# Patient Record
Sex: Male | Born: 1950 | Race: White | Hispanic: No | Marital: Single | Smoking: Never smoker
Health system: Southern US, Community
[De-identification: ages and names within clinical notes are randomized; demographics above are authoritative.]

## PROBLEM LIST (undated history)

## (undated) DIAGNOSIS — I10 Essential (primary) hypertension: Secondary | ICD-10-CM

## (undated) DIAGNOSIS — H409 Unspecified glaucoma: Secondary | ICD-10-CM

## (undated) DIAGNOSIS — D649 Anemia, unspecified: Secondary | ICD-10-CM

## (undated) HISTORY — DX: Anemia, unspecified: D64.9

## (undated) HISTORY — DX: Unspecified glaucoma: H40.9

## (undated) HISTORY — PX: HERNIA REPAIR: SHX51

---

## 2013-01-10 ENCOUNTER — Ambulatory Visit (INDEPENDENT_AMBULATORY_CARE_PROVIDER_SITE_OTHER): Payer: No Typology Code available for payment source | Admitting: Family Medicine

## 2013-01-10 VITALS — BP 140/90 | HR 105 | Temp 98.0°F | Resp 18 | Ht 66.0 in | Wt 190.0 lb

## 2013-01-10 DIAGNOSIS — K5289 Other specified noninfective gastroenteritis and colitis: Secondary | ICD-10-CM

## 2013-01-10 DIAGNOSIS — R112 Nausea with vomiting, unspecified: Secondary | ICD-10-CM

## 2013-01-10 DIAGNOSIS — L719 Rosacea, unspecified: Secondary | ICD-10-CM

## 2013-01-10 DIAGNOSIS — R197 Diarrhea, unspecified: Secondary | ICD-10-CM

## 2013-01-10 DIAGNOSIS — L711 Rhinophyma: Secondary | ICD-10-CM

## 2013-01-10 DIAGNOSIS — E869 Volume depletion, unspecified: Secondary | ICD-10-CM

## 2013-01-10 MED ORDER — ONDANSETRON 4 MG PO TBDP: 4.0000 mg | ORAL_TABLET | Freq: Three times a day (TID) | ORAL | Status: DC | PRN

## 2013-01-10 MED ORDER — METRONIDAZOLE 0.75 % EX GEL: Freq: Two times a day (BID) | CUTANEOUS | Status: DC

## 2013-01-10 NOTE — Progress Notes (Signed)
Subjective:    Patient ID: Roberto [name], male    DOB: 1950/10/07, 62 y.o.   MRN: [id number]  HPI Roberto [name] is a 62 y.o. male  No recent medical care/no primary care.   Yesterday  evening - went to Restaurant - shrimp, oysters, scallops, deep fried. Started with diarrhea and vomiting few hours later. Stomach growling.  Diarrhea - 4 times today. Vomited 4 times today. Called out of work today as some dizziness today.  UOP 4-5 times today, but dark yellow. rtried drinking fluids, but comes back up.  Upset stomach, not painful except occasional cramps.  No fever, feels some chills. Has been drinking fluids. No known sick contacts.   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 Nonsmoker.  Etoh: occasional/rare recently. DUI 14 years ago.  Breathalyzer in car until March.   Rash on face - 4 times in life. Occasional boils on another areas of the body that come and go.  diagnosed with Rosacea by ophthalmologist when also diagnosed with early glaucoma.  Review of Systems  Constitutional: Negative for fever.  Gastrointestinal: Positive for nausea, vomiting and diarrhea. Negative for anal bleeding.       No hematemesis   Genitourinary: Negative for hematuria and difficulty urinating.  Musculoskeletal: Negative for back pain.    As above.      Objective:   Physical Exam  Vitals reviewed. Constitutional: He is oriented to person, place, and time. He appears well-developed and well-nourished.  HENT:  Head: Normocephalic and atraumatic.  Nose:    Mouth/Throat: Oropharynx is clear and moist. No oropharyngeal exudate.  Eyes: EOM are normal. Pupils are equal, round, and reactive to light.  Neck: No JVD present. Carotid bruit is not present.  Cardiovascular: Normal rate, regular rhythm and normal heart sounds.   Pulmonary/Chest: Effort normal and breath sounds normal. No respiratory distress.  Abdominal: Soft. There is tenderness (minimal epigastirc.) in the epigastric area.  There is no rigidity, no rebound, no guarding, no CVA tenderness, no tenderness at McBurney's point and negative Murphy's sign.  Musculoskeletal: He exhibits no edema.  Neurological: He is alert and oriented to person, place, and time.  Skin: Skin is warm, dry and intact.  Nl turgor.   Psychiatric: He has a normal mood and affect.    Orthostatics noted. Discussed IV hydration.  He has not felt dizzy since earlier today, and would like to try ORT o/n.       Assessment & Plan:  [name] is a 62 y.o. male  N/V/D, with likely  food borne illness. vs viral illness, volume depletion with dizziness last night and earlier today. IVF declined at present. ORT stressed and technique, avoid solid food tonight, Zofran rx given for n/v.  h/o below.  ER precautions discussed for o/n and RTC in am if needed. OOW until Friday, but if not improving by then, rtc for recheck and possible stool cx. Sooner if any worsening. Understanding expressed.   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  Trigger avoidance discussed, but will refer to derm as may need more than just topical treatment. Few irritated papulopustular areas, will start metronidazole gel topically. Sed, avoid alcohol.   Meds ordered this encounter  Medications  . ondansetron (ZOFRAN ODT) 4 MG disintegrating tablet    Sig: Take 1 tablet (4 mg total) by mouth every 8 (eight) hours as needed for nausea.    Dispense:  10 tablet    Refill:  0  . metroNIDAZOLE (METROGEL) 0.75 %  gel    Sig: Apply topically 2 (two) times daily. To affected areas of face. Avoid eyes/mouth.    Dispense:  45 g    Refill:  1   Patient Instructions  Recheck in 2 days if not much improved. Return to the clinic or go to the nearest emergency room if any of your symptoms worsen or new symptoms occur. We will refer you to dermatologist.  Can start antibiotic gel now.  Do NOT work on elevated surfaces if any dizziness returns.    Gastroenteritis:  Diarrhea Infections caused by germs (bacterial) or a virus commonly cause diarrhea. Your caregiver has determined that with time, rest and fluids, the diarrhea should improve. In general, eat normally while drinking more water than usual. Although water may prevent dehydration, it does not contain salt and minerals (electrolytes). Broths, weak tea without caffeine and oral rehydration solutions (ORS) replace fluids and electrolytes. Small amounts of fluids should be taken frequently. Large amounts at one time may not be tolerated. Plain water may be harmful in infants and the elderly. Oral rehydrating solutions (ORS) are available at pharmacies and grocery stores. ORS replace water and important electrolytes in proper proportions. Sports drinks are not as effective as ORS and may be harmful due to sugars worsening diarrhea.  ORS is especially recommended for use in children with diarrhea. As a general guideline for children, replace any new fluid losses from diarrhea and/or vomiting with ORS as follows:   If your child weighs 22 pounds or under (10 kg or less), give 60-120 mL ( -  cup or 2 - 4 ounces) of ORS for each episode of diarrheal stool or vomiting episode.   If your child weighs more than 22 pounds (more than 10 kgs), give 120-240 mL ( - 1 cup or 4 - 8 ounces) of ORS for each diarrheal stool or episode of vomiting.   While correcting for dehydration, children should eat normally. However, foods high in sugar should be avoided because this may worsen diarrhea. Large amounts of carbonated soft drinks, juice, gelatin desserts and other highly sugared drinks should be avoided.   After correction of dehydration, other liquids that are appealing to the child may be added. Children should drink small amounts of fluids frequently and fluids should be increased as tolerated. Children should drink enough fluids to keep urine clear or pale yellow.   Adults should eat normally  while drinking more fluids than usual. Drink small amounts of fluids frequently and increase as tolerated. Drink enough fluids to keep urine clear or pale yellow. Broths, weak decaffeinated tea, lemon lime soft drinks (allowed to go flat) and ORS replace fluids and electrolytes.   Avoid:   Carbonated drinks.   Juice.   Extremely hot or cold fluids.   Caffeine drinks.   Fatty, greasy foods.   Alcohol.   Tobacco.   Too much intake of anything at one time.   Gelatin desserts.   Probiotics are active cultures of beneficial bacteria. They may lessen the amount and number of diarrheal stools in adults. Probiotics can be found in yogurt with active cultures and in supplements.   Wash hands well to avoid spreading bacteria and virus.   Anti-diarrheal medications are not recommended for infants and children.   Only take over-the-counter or prescription medicines for pain, discomfort or fever as directed by your caregiver. Do not give aspirin to children because it may cause Reye's Syndrome.   For adults, ask your caregiver if you should continue  all prescribed and over-the-counter medicines.   If your caregiver has given you a follow-up appointment, it is very important to keep that appointment. Not keeping the appointment could result in a chronic or permanent injury, and disability. If there is any problem keeping the appointment, you must call back to this facility for assistance.  SEEK IMMEDIATE MEDICAL CARE IF:   You or your child is unable to keep fluids down or other symptoms or problems become worse in spite of treatment.   Vomiting or diarrhea develops and becomes persistent.   There is vomiting of blood or bile (green material).   There is blood in the stool or the stools are black and tarry.   There is no urine output in 6-8 hours or there is only a small amount of very dark urine.   Abdominal pain develops, increases or localizes.   You have a fever.   Your baby is  older than 3 months with a rectal temperature of 102 F (38.9 C) or higher.   Your baby is 40 months old or younger with a rectal temperature of 100.4 F (38 C) or higher.   You or your child develops excessive weakness, dizziness, fainting or extreme thirst.   You or your child develops a rash, stiff neck, severe headache or become irritable or sleepy and difficult to awaken.  MAKE SURE YOU:   Understand these instructions.   Will watch your condition.   Will get help right away if you are not doing well or get worse.  Document Released: 04/23/2002 Document Revised: 04/22/2011 Document Reviewed: 03/10/2009 [facility] Patient Information 2012 [place], [place].  Nausea and Vomiting Nausea is a sick feeling that often comes before throwing up (vomiting). Vomiting is a reflex where stomach contents come out of your mouth. Vomiting can cause severe loss of body fluids (dehydration). Children and elderly adults can become dehydrated quickly, especially if they also have diarrhea. Nausea and vomiting are symptoms of a condition or disease. It is important to find the cause of your symptoms. CAUSES   Direct irritation of the stomach lining. This irritation can result from increased acid production (gastroesophageal reflux disease), infection, food poisoning, taking certain medicines (such as nonsteroidal anti-inflammatory drugs), alcohol use, or tobacco use.   Signals from the brain.These signals could be caused by a headache, heat exposure, an inner ear disturbance, increased pressure in the brain from injury, infection, a tumor, or a concussion, pain, emotional stimulus, or metabolic problems.   An obstruction in the gastrointestinal tract (bowel obstruction).   Illnesses such as diabetes, hepatitis, gallbladder problems, appendicitis, kidney problems, cancer, sepsis, atypical symptoms of a heart attack, or eating disorders.   Medical treatments such as chemotherapy and radiation.    Receiving medicine that makes you sleep (general anesthetic) during surgery.  DIAGNOSIS Your caregiver may ask for tests to be done if the problems do not improve after a few days. Tests may also be done if symptoms are severe or if the reason for the nausea and vomiting is not clear. Tests may include:  Urine tests.   Blood tests.   Stool tests.   Cultures (to look for evidence of infection).   X-rays or other imaging studies.  Test results can help your caregiver make decisions about treatment or the need for additional tests. TREATMENT You need to stay well hydrated. Drink frequently but in small amounts.You may wish to drink water, sports drinks, clear broth, or eat frozen ice pops or gelatin dessert to help  stay hydrated.When you eat, eating slowly may help prevent nausea.There are also some antinausea medicines that may help prevent nausea. HOME CARE INSTRUCTIONS   Take all medicine as directed by your caregiver.   If you do not have an appetite, do not force yourself to eat. However, you must continue to drink fluids.   If you have an appetite, eat a normal diet unless your caregiver tells you differently.   Eat a variety of complex carbohydrates (rice, wheat, potatoes, bread), lean meats, yogurt, fruits, and vegetables.   Avoid high-fat foods because they are more difficult to digest.   Drink enough water and fluids to keep your urine clear or pale yellow.   If you are dehydrated, ask your caregiver for specific rehydration instructions. Signs of dehydration may include:   Severe thirst.   Dry lips and mouth.   Dizziness.   Dark urine.   Decreasing urine frequency and amount.   Confusion.   Rapid breathing or pulse.  SEEK IMMEDIATE MEDICAL CARE IF:   You have blood or brown flecks (like coffee grounds) in your vomit.   You have black or bloody stools.   You have a severe headache or stiff neck.   You are confused.   You have severe abdominal pain.    You have chest pain or trouble breathing.   You do not urinate at least once every 8 hours.   You develop cold or clammy skin.   You continue to vomit for longer than 24 to 48 hours.   You have a fever.  MAKE SURE YOU:   Understand these instructions.   Will watch your condition.   Will get help right away if you are not doing well or get worse.  Document Released: 05/03/2005 Document Revised: 04/22/2011 Document Reviewed: 09/30/2010 [facility] Patient Information 2012 [place], [place].  Return to the clinic or go to the nearest emergency room if any of your symptoms worsen or new symptoms occur.   Rosacea Rosacea is a long-term (chronic) condition that affects the skin of the face (cheeks, nose, brow, and chin) and sometimes the eyes. Rosacea causes the blood vessels near the surface of the skin to enlarge, resulting in redness. This condition usually begins after age 93. It occurs most often in light-skinned women. Without treatment, rosacea tends to get worse over time. There is no cure for rosacea, but treatment can help control your symptoms. CAUSES  The cause is unknown. It is thought that some people may inherit a tendency to develop rosacea. Certain triggers can make your rosacea worse, including:  Hot baths.  Exercise.  Sunlight.  Very hot or cold temperatures.  Hot or spicy foods and drinks.  Drinking alcohol.  Stress.  Taking blood pressure medicine.  Long-term use of topical steroids on the face. SYMPTOMS   Redness of the face.  Red bumps or pimples on the face.  Red, enlarged nose (rhinophyma).  Blushing easily.  Red lines on the skin.  Irritated or burning feeling in the eyes.  Swollen eyelids. DIAGNOSIS  Your caregiver can usually tell what is wrong by asking about your symptoms and performing a physical exam. TREATMENT  Avoiding triggers is an important part of treatment. You will also need to see a skin specialist (dermatologist) who can  develop a treatment plan for you. The goals of treatment are to control your condition and to improve the appearance of your skin. It may take several weeks or months of treatment before you notice an improvement in  your skin. Even after your skin improves, you will likely need to continue treatment to prevent your rosacea from coming back. Treatment methods may include:  Using sunscreen or sunblock daily to protect the skin.  Antibiotic medicine, such as metronidazole, applied directly to the skin.  Antibiotics taken by mouth. This is usually prescribed if you have eye problems from your rosacea.  Laser surgery to improve the appearance of the skin. This surgery can reduce the appearance of red lines on the skin and can remove excess tissue from the nose to reduce its size. HOME CARE INSTRUCTIONS  Avoid things that seem to trigger your flare-ups.  If you are given antibiotics, take them as directed. Finish them even if you start to feel better.  Use a gentle facial cleanser that does not contain alcohol.  You may use a mild facial moisturizer.  Use a sunscreen or sunblock with SPF 30 or greater.  Wear a green-tinted foundation powder to conceal redness, if needed. Choose cosmetics that are noncomedogenic. This means they do not block your pores.  If your eyelids are affected, apply warm compresses to the eyelids. Do this up to 4 times a day or as directed by your caregiver. SEEK MEDICAL CARE IF:  Your skin problems get worse.  You feel depressed.  You lose your appetite.  You have trouble concentrating.  You have problems with your eyes, such as redness or itching. MAKE SURE YOU:  Understand these instructions.  Will watch your condition.  Will get help right away if you are not doing well or get worse. Document Released: 06/10/2004 Document Revised: 11/02/2011 Document Reviewed: 04/13/2011 [facility] Patient Information 2014 [place], [place].

## 2013-01-10 NOTE — Patient Instructions (Addendum)
Recheck in 2 days if not much improved. Return to the clinic or go to the nearest emergency room if any of your symptoms worsen or new symptoms occur. We will refer you to dermatologist.  Can start antibiotic gel now.  Do NOT work on elevated surfaces if any dizziness returns.   Gastroenteritis:  Diarrhea Infections caused by germs (bacterial) or a virus commonly cause diarrhea. Your caregiver has determined that with time, rest and fluids, the diarrhea should improve. In general, eat normally while drinking more water than usual. Although water may prevent dehydration, it does not contain salt and minerals (electrolytes). Broths, weak tea without caffeine and oral rehydration solutions (ORS) replace fluids and electrolytes. Small amounts of fluids should be taken frequently. Large amounts at one time may not be tolerated. Plain water may be harmful in infants and the elderly. Oral rehydrating solutions (ORS) are available at pharmacies and grocery stores. ORS replace water and important electrolytes in proper proportions. Sports drinks are not as effective as ORS and may be harmful due to sugars worsening diarrhea.  ORS is especially recommended for use in children with diarrhea. As a general guideline for children, replace any new fluid losses from diarrhea and/or vomiting with ORS as follows:   If your child weighs 22 pounds or under (10 kg or less), give 60-120 mL ( -  cup or 2 - 4 ounces) of ORS for each episode of diarrheal stool or vomiting episode.   If your child weighs more than 22 pounds (more than 10 kgs), give 120-240 mL ( - 1 cup or 4 - 8 ounces) of ORS for each diarrheal stool or episode of vomiting.   While correcting for dehydration, children should eat normally. However, foods high in sugar should be avoided because this may worsen diarrhea. Large amounts of carbonated soft drinks, juice, gelatin desserts and other highly sugared drinks should be avoided.   After correction of  dehydration, other liquids that are appealing to the child may be added. Children should drink small amounts of fluids frequently and fluids should be increased as tolerated. Children should drink enough fluids to keep urine clear or pale yellow.   Adults should eat normally while drinking more fluids than usual. Drink small amounts of fluids frequently and increase as tolerated. Drink enough fluids to keep urine clear or pale yellow. Broths, weak decaffeinated tea, lemon lime soft drinks (allowed to go flat) and ORS replace fluids and electrolytes.   Avoid:   Carbonated drinks.   Juice.   Extremely hot or cold fluids.   Caffeine drinks.   Fatty, greasy foods.   Alcohol.   Tobacco.   Too much intake of anything at one time.   Gelatin desserts.   Probiotics are active cultures of beneficial bacteria. They may lessen the amount and number of diarrheal stools in adults. Probiotics can be found in yogurt with active cultures and in supplements.   Wash hands well to avoid spreading bacteria and virus.   Anti-diarrheal medications are not recommended for infants and children.   Only take over-the-counter or prescription medicines for pain, discomfort or fever as directed by your caregiver. Do not give aspirin to children because it may cause Reye's Syndrome.   For adults, ask your caregiver if you should continue all prescribed and over-the-counter medicines.   If your caregiver has given you a follow-up appointment, it is very important to keep that appointment. Not keeping the appointment could result in a chronic or permanent injury, and  disability. If there is any problem keeping the appointment, you must call back to this facility for assistance.  SEEK IMMEDIATE MEDICAL CARE IF:   You or your child is unable to keep fluids down or other symptoms or problems become worse in spite of treatment.   Vomiting or diarrhea develops and becomes persistent.   There is vomiting of blood  or bile (green material).   There is blood in the stool or the stools are black and tarry.   There is no urine output in 6-8 hours or there is only a small amount of very dark urine.   Abdominal pain develops, increases or localizes.   You have a fever.   Your baby is older than 3 months with a rectal temperature of 102 F (38.9 C) or higher.   Your baby is 56 months old or younger with a rectal temperature of 100.4 F (38 C) or higher.   You or your child develops excessive weakness, dizziness, fainting or extreme thirst.   You or your child develops a rash, stiff neck, severe headache or become irritable or sleepy and difficult to awaken.  MAKE SURE YOU:   Understand these instructions.   Will watch your condition.   Will get help right away if you are not doing well or get worse.  Document Released: 04/23/2002 Document Revised: 04/22/2011 Document Reviewed: 03/10/2009 [facility] Patient Information 2012 [place], [place].  Nausea and Vomiting Nausea is a sick feeling that often comes before throwing up (vomiting). Vomiting is a reflex where stomach contents come out of your mouth. Vomiting can cause severe loss of body fluids (dehydration). Children and elderly adults can become dehydrated quickly, especially if they also have diarrhea. Nausea and vomiting are symptoms of a condition or disease. It is important to find the cause of your symptoms. CAUSES   Direct irritation of the stomach lining. This irritation can result from increased acid production (gastroesophageal reflux disease), infection, food poisoning, taking certain medicines (such as nonsteroidal anti-inflammatory drugs), alcohol use, or tobacco use.   Signals from the brain.These signals could be caused by a headache, heat exposure, an inner ear disturbance, increased pressure in the brain from injury, infection, a tumor, or a concussion, pain, emotional stimulus, or metabolic problems.   An obstruction in the  gastrointestinal tract (bowel obstruction).   Illnesses such as diabetes, hepatitis, gallbladder problems, appendicitis, kidney problems, cancer, sepsis, atypical symptoms of a heart attack, or eating disorders.   Medical treatments such as chemotherapy and radiation.   Receiving medicine that makes you sleep (general anesthetic) during surgery.  DIAGNOSIS Your caregiver may ask for tests to be done if the problems do not improve after a few days. Tests may also be done if symptoms are severe or if the reason for the nausea and vomiting is not clear. Tests may include:  Urine tests.   Blood tests.   Stool tests.   Cultures (to look for evidence of infection).   X-rays or other imaging studies.  Test results can help your caregiver make decisions about treatment or the need for additional tests. TREATMENT You need to stay well hydrated. Drink frequently but in small amounts.You may wish to drink water, sports drinks, clear broth, or eat frozen ice pops or gelatin dessert to help stay hydrated.When you eat, eating slowly may help prevent nausea.There are also some antinausea medicines that may help prevent nausea. HOME CARE INSTRUCTIONS   Take all medicine as directed by your caregiver.   If you  do not have an appetite, do not force yourself to eat. However, you must continue to drink fluids.   If you have an appetite, eat a normal diet unless your caregiver tells you differently.   Eat a variety of complex carbohydrates (rice, wheat, potatoes, bread), lean meats, yogurt, fruits, and vegetables.   Avoid high-fat foods because they are more difficult to digest.   Drink enough water and fluids to keep your urine clear or pale yellow.   If you are dehydrated, ask your caregiver for specific rehydration instructions. Signs of dehydration may include:   Severe thirst.   Dry lips and mouth.   Dizziness.   Dark urine.   Decreasing urine frequency and amount.   Confusion.    Rapid breathing or pulse.  SEEK IMMEDIATE MEDICAL CARE IF:   You have blood or brown flecks (like coffee grounds) in your vomit.   You have black or bloody stools.   You have a severe headache or stiff neck.   You are confused.   You have severe abdominal pain.   You have chest pain or trouble breathing.   You do not urinate at least once every 8 hours.   You develop cold or clammy skin.   You continue to vomit for longer than 24 to 48 hours.   You have a fever.  MAKE SURE YOU:   Understand these instructions.   Will watch your condition.   Will get help right away if you are not doing well or get worse.  Document Released: 05/03/2005 Document Revised: 04/22/2011 Document Reviewed: 09/30/2010 [facility] Patient Information 2012 [place], [place].  Return to the clinic or go to the nearest emergency room if any of your symptoms worsen or new symptoms occur.   Rosacea Rosacea is a long-term (chronic) condition that affects the skin of the face (cheeks, nose, brow, and chin) and sometimes the eyes. Rosacea causes the blood vessels near the surface of the skin to enlarge, resulting in redness. This condition usually begins after age 79. It occurs most often in light-skinned women. Without treatment, rosacea tends to get worse over time. There is no cure for rosacea, but treatment can help control your symptoms. CAUSES  The cause is unknown. It is thought that some people may inherit a tendency to develop rosacea. Certain triggers can make your rosacea worse, including:  Hot baths.  Exercise.  Sunlight.  Very hot or cold temperatures.  Hot or spicy foods and drinks.  Drinking alcohol.  Stress.  Taking blood pressure medicine.  Long-term use of topical steroids on the face. SYMPTOMS   Redness of the face.  Red bumps or pimples on the face.  Red, enlarged nose (rhinophyma).  Blushing easily.  Red lines on the skin.  Irritated or burning feeling in the  eyes.  Swollen eyelids. DIAGNOSIS  Your caregiver can usually tell what is wrong by asking about your symptoms and performing a physical exam. TREATMENT  Avoiding triggers is an important part of treatment. You will also need to see a skin specialist (dermatologist) who can develop a treatment plan for you. The goals of treatment are to control your condition and to improve the appearance of your skin. It may take several weeks or months of treatment before you notice an improvement in your skin. Even after your skin improves, you will likely need to continue treatment to prevent your rosacea from coming back. Treatment methods may include:  Using sunscreen or sunblock daily to protect the skin.  Antibiotic  medicine, such as metronidazole, applied directly to the skin.  Antibiotics taken by mouth. This is usually prescribed if you have eye problems from your rosacea.  Laser surgery to improve the appearance of the skin. This surgery can reduce the appearance of red lines on the skin and can remove excess tissue from the nose to reduce its size. HOME CARE INSTRUCTIONS  Avoid things that seem to trigger your flare-ups.  If you are given antibiotics, take them as directed. Finish them even if you start to feel better.  Use a gentle facial cleanser that does not contain alcohol.  You may use a mild facial moisturizer.  Use a sunscreen or sunblock with SPF 30 or greater.  Wear a green-tinted foundation powder to conceal redness, if needed. Choose cosmetics that are noncomedogenic. This means they do not block your pores.  If your eyelids are affected, apply warm compresses to the eyelids. Do this up to 4 times a day or as directed by your caregiver. SEEK MEDICAL CARE IF:  Your skin problems get worse.  You feel depressed.  You lose your appetite.  You have trouble concentrating.  You have problems with your eyes, such as redness or itching. MAKE SURE YOU:  Understand these  instructions.  Will watch your condition.  Will get help right away if you are not doing well or get worse. Document Released: 06/10/2004 Document Revised: 11/02/2011 Document Reviewed: 04/13/2011 [facility] Patient Information 2014 [place], [place].

## 2013-12-31 ENCOUNTER — Ambulatory Visit (INDEPENDENT_AMBULATORY_CARE_PROVIDER_SITE_OTHER): Payer: No Typology Code available for payment source | Admitting: Family Medicine

## 2013-12-31 VITALS — BP 150/78 | HR 92 | Temp 98.4°F | Resp 16 | Ht 64.5 in | Wt 184.2 lb

## 2013-12-31 DIAGNOSIS — R197 Diarrhea, unspecified: Secondary | ICD-10-CM

## 2013-12-31 DIAGNOSIS — L719 Rosacea, unspecified: Secondary | ICD-10-CM

## 2013-12-31 DIAGNOSIS — R112 Nausea with vomiting, unspecified: Secondary | ICD-10-CM

## 2013-12-31 DIAGNOSIS — K5289 Other specified noninfective gastroenteritis and colitis: Secondary | ICD-10-CM

## 2013-12-31 LAB — POCT CBC
GRANULOCYTE PERCENT: 68.5 % (ref 37–80)
HEMATOCRIT: 51 % (ref 43.5–53.7)
HEMOGLOBIN: 16.5 g/dL (ref 14.1–18.1)
Lymph, poc: 2.1 (ref 0.6–3.4)
MCH, POC: 30.3 pg (ref 27–31.2)
MCHC: 32.4 g/dL (ref 31.8–35.4)
MCV: 93.3 fL (ref 80–97)
MID (cbc): 0.2 (ref 0–0.9)
MPV: 7.1 fL (ref 0–99.8)
POC GRANULOCYTE: 4.8 (ref 2–6.9)
POC LYMPH %: 29.3 % (ref 10–50)
POC MID %: 2.2 %M (ref 0–12)
Platelet Count, POC: 215 10*3/uL (ref 142–424)
RBC: 5.46 M/uL (ref 4.69–6.13)
RDW, POC: 14.3 %
WBC: 7 10*3/uL (ref 4.6–10.2)

## 2013-12-31 MED ORDER — ONDANSETRON 4 MG PO TBDP: 4.0000 mg | ORAL_TABLET | Freq: Three times a day (TID) | ORAL | Status: DC | PRN

## 2013-12-31 NOTE — Patient Instructions (Signed)
Rest and drink plenty of fluids- gatorade is especially helpful.  You should start with bland foods such as broth, saltines, dry toast, bananas when you are able to start eating again.   If you are not feeling better in the next couple of days please let me know.  If you start getting worse either come and see us or go to the ER for follow-up.  .  I will be in touch with the rest of your labs.

## 2013-12-31 NOTE — Progress Notes (Addendum)
[facility] [address], [place] [address] [phone]  Date:  12/31/2013   Name:  Roberto [name]   DOB:  1951/04/22   MRN:  [id number]  PCP:  No PCP Per Patient    Chief Complaint: Emesis and Diarrhea   History of Present Illness:  Roberto [name] is a 63 y.o. very pleasant male patient who presents with the following:  Early this am (around 1am) he got up and had some cereal because he was hungry.  A few hours later he started vomiting and having diarrhea- he checked the milk and it seemed to be spoiled.  He figured that the milk must have made him sick.   He notes vomiting 4x, and diarrhea 4x.   He has not noted a fever, he has noted intermittent abdominal pain.   He feels overall better than he did earlier today.   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    There are no active problems to display for this patient.   Past Medical History  Diagnosis Date  . Anemia   . Glaucoma     Past Surgical History  Procedure Laterality Date  . Hernia repair      History  Substance Use Topics  . Smoking status: Never Smoker   . Smokeless tobacco: Not on file  . Alcohol Use: No    Family History  Problem Relation Age of Onset  . Heart disease Mother   . Stroke Father     No Known Allergies  Medication list has been reviewed and updated.  Current Outpatient Prescriptions on File Prior to Visit  Medication Sig Dispense Refill  . metroNIDAZOLE (METROGEL) 0.75 % gel Apply topically 2 (two) times daily. To affected areas of face. Avoid eyes/mouth.  45 g  1  . ondansetron (ZOFRAN ODT) 4 MG disintegrating tablet Take 1 tablet (4 mg total) by mouth every 8 (eight) hours as needed for nausea.  10 tablet  0   No current facility-administered medications on file prior to visit.    Review of Systems:  As per HPI- otherwise negative.   Physical Examination: Filed Vitals:   12/31/13 1343   BP: 150/78  Pulse: 92  Temp: 98.4 F (36.9 C)  Resp: 16   Filed Vitals:   12/31/13 1343  Height: 5' 4.5" (1.638 m)  Weight: 184 lb 3.2 oz (83.553 kg)   Body mass index is 31.14 kg/(m^2). Ideal Body Weight: Weight in (lb) to have BMI = 25: 147.6  GEN: WDWN, NAD, Non-toxic, A & O x 3 HEENT: Atraumatic, Normocephalic. Neck supple. No masses, No LAD.  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    Bilateral TM wnl, oropharynx normal.  PEERL,EOMI.   He has a couple of benign appearing facial nodules that he would like to have removed Ears and Nose: No external deformity. CV: RRR, No M/G/R. No JVD. No thrill. No extra heart sounds. PULM: CTA B, no wheezes, crackles, rhonchi. No retractions. No resp. distress. No accessory muscle use. ABD: S, NT, ND, +BS. No rebound. No HSM.  Benign exam EXTR: No c/c/e NEURO Normal gait.  PSYCH: Normally interactive. Conversant. Not depressed or anxious appearing.  Calm demeanor.   Results for orders placed in visit on 12/31/13  POCT CBC      Result Value Ref Range   WBC 7.0  4.6 - 10.2 K/uL   Lymph, poc 2.1  0.6 - 3.4  POC LYMPH PERCENT 29.3  10 - 50 %L   MID (cbc) 0.2  0 - 0.9   POC MID % 2.2  0 - 12 %M   POC Granulocyte 4.8  2 - 6.9   Granulocyte percent 68.5  37 - 80 %G   RBC 5.46  4.69 - 6.13 M/uL   Hemoglobin 16.5  14.1 - 18.1 g/dL   HCT, POC 40.951.0  81.143.5 - 53.7 %   MCV 93.3  80 - 97 fL   MCH, POC 30.3  27 - 31.2 pg   MCHC 32.4  31.8 - 35.4 g/dL   RDW, POC 91.414.3     Platelet Count, POC 215  142 - 424 K/uL   MPV 7.1  0 - 99.8 fL    Assessment and Plan: Diarrhea - Plan: Comprehensive metabolic panel, Lipase, POCT CBC, CANCELED: CBC with Differential  Nausea and vomiting, vomiting of unspecified type - Plan: Comprehensive metabolic panel, Lipase, POCT CBC, CANCELED: CBC with Differential  Other and unspecified noninfectious gastroenteritis and colitis(558.9) - Plan: ondansetron (ZOFRAN ODT) 4 MG disintegrating tablet  Non-intractable vomiting with  nausea, vomiting of unspecified type - Plan: ondansetron (ZOFRAN ODT) 4 MG disintegrating tablet  Rosacea - Plan: Ambulatory referral to Dermatology  He declines IVF today.  Would like a note for work today and tomorrow which is reasonable.  Refilled his zofran to use as needed, and await other labs as above.   Close follow-up if not better, discussed hydration and bland diet.   Referral to dermatology   Signed Abbe AmsterdamJessica Desarae Placide, MD  [facility], will send a copy. Let me know if not better!

## 2014-01-01 ENCOUNTER — Encounter: Payer: Self-pay | Admitting: Family Medicine

## 2014-01-01 LAB — COMPREHENSIVE METABOLIC PANEL
ALBUMIN: 4.4 g/dL (ref 3.5–5.2)
ALT: 18 U/L (ref 0–53)
AST: 19 U/L (ref 0–37)
Alkaline Phosphatase: 68 U/L (ref 39–117)
BUN: 13 mg/dL (ref 6–23)
CALCIUM: 9.4 mg/dL (ref 8.4–10.5)
CHLORIDE: 102 meq/L (ref 96–112)
CO2: 27 meq/L (ref 19–32)
CREATININE: 0.88 mg/dL (ref 0.50–1.35)
Glucose, Bld: 98 mg/dL (ref 70–99)
POTASSIUM: 4.4 meq/L (ref 3.5–5.3)
Sodium: 137 mEq/L (ref 135–145)
Total Bilirubin: 0.6 mg/dL (ref 0.2–1.2)
Total Protein: 7.6 g/dL (ref 6.0–8.3)

## 2014-01-01 LAB — LIPASE: LIPASE: 48 U/L (ref 0–75)

## 2014-01-02 ENCOUNTER — Telehealth: Payer: Self-pay

## 2014-01-02 NOTE — Telephone Encounter (Signed)
PATIENT IS CALLING IN NEED OF A DOCTOR NOTE FOR TODAY. HE STATES HE'S FEELING A LOT BETTER BUT NOT COMPLETELY. HE SEES DR. Patsy LagerOPLAND. PLEASE CALL PATIENT [phone]

## 2014-01-02 NOTE — Telephone Encounter (Signed)
Wrote letter for patient to rtn to work 01/03/14 Pt notified.

## 2014-01-03 ENCOUNTER — Telehealth: Payer: Self-pay

## 2014-01-03 NOTE — Telephone Encounter (Signed)
Pt called back about the return to work form. Can we please fax a copy to [phone]? This is the pt's employer. The pt is too ill to drive to pick up the work excuse. Please contact the pt once this is completed [phone]

## 2014-01-03 NOTE — Telephone Encounter (Signed)
Pt is requesting an extension to his return to work form. Dr. Patsy Lagercopland said he could call if he didn't feel up to going to work today. He would like to return tomorrow august 21st instead.  Please call the pt once this is ready. [phone]

## 2014-01-03 NOTE — Telephone Encounter (Signed)
Faxed

## 2015-11-11 ENCOUNTER — Ambulatory Visit (INDEPENDENT_AMBULATORY_CARE_PROVIDER_SITE_OTHER): Payer: No Typology Code available for payment source

## 2015-11-11 ENCOUNTER — Ambulatory Visit (INDEPENDENT_AMBULATORY_CARE_PROVIDER_SITE_OTHER): Payer: No Typology Code available for payment source | Admitting: Urgent Care

## 2015-11-11 VITALS — BP 122/80 | HR 80 | Temp 98.8°F | Resp 17 | Ht 66.0 in | Wt 186.0 lb

## 2015-11-11 DIAGNOSIS — Z23 Encounter for immunization: Secondary | ICD-10-CM

## 2015-11-11 DIAGNOSIS — L989 Disorder of the skin and subcutaneous tissue, unspecified: Secondary | ICD-10-CM | POA: Diagnosis not present

## 2015-11-11 DIAGNOSIS — Z0000 Encounter for general adult medical examination without abnormal findings: Secondary | ICD-10-CM | POA: Diagnosis not present

## 2015-11-11 DIAGNOSIS — M25472 Effusion, left ankle: Secondary | ICD-10-CM | POA: Diagnosis not present

## 2015-11-11 DIAGNOSIS — Z1211 Encounter for screening for malignant neoplasm of colon: Secondary | ICD-10-CM | POA: Diagnosis not present

## 2015-11-11 DIAGNOSIS — M7662 Achilles tendinitis, left leg: Secondary | ICD-10-CM

## 2015-11-11 LAB — POCT CBC
Granulocyte percent: 68 %G (ref 37–80)
HEMATOCRIT: 45.9 % (ref 43.5–53.7)
Hemoglobin: 15.8 g/dL (ref 14.1–18.1)
LYMPH, POC: 1.9 (ref 0.6–3.4)
MCH, POC: 31.5 pg — AB (ref 27–31.2)
MCHC: 34.5 g/dL (ref 31.8–35.4)
MCV: 91.3 fL (ref 80–97)
MID (cbc): 0.3 (ref 0–0.9)
MPV: 6.8 fL (ref 0–99.8)
POC GRANULOCYTE: 4.8 (ref 2–6.9)
POC LYMPH %: 27.3 % (ref 10–50)
POC MID %: 4.7 % (ref 0–12)
Platelet Count, POC: 198 10*3/uL (ref 142–424)
RBC: 4.03 M/uL — AB (ref 4.69–6.13)
RDW, POC: 13 %
WBC: 7.1 10*3/uL (ref 4.6–10.2)

## 2015-11-11 MED ORDER — ZOSTER VACCINE LIVE 19400 UNT/0.65ML ~~LOC~~ SUSR: 0.6500 mL | Freq: Once | SUBCUTANEOUS | Status: DC

## 2015-11-11 MED ORDER — NAPROXEN SODIUM 550 MG PO TABS: 550.0000 mg | ORAL_TABLET | Freq: Two times a day (BID) | ORAL | Status: DC

## 2015-11-11 MED ORDER — PREDNISONE 20 MG PO TABS: ORAL_TABLET | ORAL | Status: DC

## 2015-11-11 NOTE — Patient Instructions (Addendum)
Achilles Tendinitis Achilles tendinitis is inflammation of the tough, cord-like band that attaches the lower muscles of your leg to your heel (Achilles tendon). It is usually caused by overusing the tendon and joint involved.  CAUSES Achilles tendinitis can happen because of:  A sudden increase in exercise or activity (such as running).  Doing the same exercises or activities (such as jumping) over and over.  Not warming up calf muscles before exercising.  Exercising in shoes that are worn out or not made for exercise.  Having arthritis or a bone growth on the back of the heel bone. This can rub against the tendon and hurt the tendon. SIGNS AND SYMPTOMS The most common symptoms are:  Pain in the back of the leg, just above the heel. The pain usually gets worse with exercise and better with rest.  Stiffness or soreness in the back of the leg, especially in the morning.  Swelling of the skin over the Achilles tendon.  Trouble standing on tiptoe. Sometimes, an Achilles tendon tears (ruptures). Symptoms of an Achilles tendon rupture can include:  Sudden, severe pain in the back of the leg.  Trouble putting weight on the foot or walking normally. DIAGNOSIS Achilles tendinitis will be diagnosed based on symptoms and a physical examination. An X-ray may be done to check if another condition is causing your symptoms. An MRI may be ordered if your health care provider suspects you may have completely torn your tendon, which is called an Achilles tendon rupture.  TREATMENT  Achilles tendinitis usually gets better over time. It can take weeks to months to heal completely. Treatment focuses on treating the symptoms and helping the injury heal. HOME CARE INSTRUCTIONS   Rest your Achilles tendon and avoid activities that cause pain.  Apply ice to the injured area:  Put ice in a plastic bag.  Place a towel between your skin and the bag.  Leave the ice on for 20 minutes, 2-3 times a  day  Try to avoid using the tendon (other than gentle range of motion) while the tendon is painful. Do not resume use until instructed by your health care provider. Then begin use gradually. Do not increase use to the point of pain. If pain does develop, decrease use and continue the above measures. Gradually increase activities that do not cause discomfort until you achieve normal use.  Do exercises to make your calf muscles stronger and more flexible. Your health care provider or physical therapist can recommend exercises for you to do.  Wrap your ankle with an elastic bandage or other wrap. This can help keep your tendon from moving too much. Your health care provider will show you how to wrap your ankle correctly.  Only take over-the-counter or prescription medicines for pain, discomfort, or fever as directed by your health care provider. SEEK MEDICAL CARE IF:   Your pain and swelling increase or pain is uncontrolled with medicines.  You develop new, unexplained symptoms or your symptoms get worse.  You are unable to move your toes or foot.  You develop warmth and swelling in your foot.  You have an unexplained temperature. MAKE SURE YOU:   Understand these instructions.  Will watch your condition.  Will get help right away if you are not doing well or get worse.   This information is not intended to replace advice given to you by your health care provider. Make sure you discuss any questions you have with your health care provider.   Document Released:   02/10/2005 Document Revised: 05/24/2014 Document Reviewed: 12/13/2012 [employer] Interactive Patient Education 2016 [employer]    IF you received an x-ray today, you will receive an invoice from [facility]. Please contact [facility] at [phone] with questions or concerns regarding your invoice.   IF you received labwork today, you will receive an invoice from [facility].  Please contact [facility] at [phone] with questions or concerns regarding your invoice.   Our billing staff will not be able to assist you with questions regarding bills from these companies.  You will be contacted with the lab results as soon as they are available. The fastest way to get your results is to activate your My Chart account. Instructions are located on the last page of this paperwork. If you have not heard from us regarding the results in 2 weeks, please contact this office.

## 2015-11-11 NOTE — Progress Notes (Signed)
MRN: [id number]  Subjective:   [name] is a 65 y.o. male presenting for annual physical exam.  Medical care team includes: PCP: No PCP Per Patient Vision: Last eye exam 09/2015, has new rx for eye glasses. Dental: Dental cleanings every year. Specialists: Has never had a colonoscopy.   Patient is single, works as a Aeronautical engineermaintenance mechanic/electrician. Denies smoking cigarettes. Drinks ~4 alcohol drinks per week.   Left Foot - Reports 1 day history of left foot swelling. Denies trauma, fever, falls, ankle injury. Cannot identify any inciting factors. Patient has been fairly active lately, walks ~6 miles every day but has never had an issue before.  Roberto [name] has Rosacea on his problem list.  Roberto [name] currently has no medications in their medication list. He has No Known Allergies.  Roberto [name]  has a past medical history of Anemia and Glaucoma. Also  has past surgical history that includes Hernia repair.  His family history includes Heart disease in his mother; Stroke in his father.  Immunizations: Needs TDAP, pneumonia vaccine.  ROS   Objective:   Vitals: BP 122/80 mmHg  Pulse 80  Temp(Src) 98.8 F (37.1 C) (Oral)  Resp 17  Ht 5\' 6"  (1.676 m)  Wt 186 lb (84.369 kg)  BMI 30.04 kg/m2  SpO2 96%  Physical Exam  Constitutional: He is oriented to person, place, and time. He appears well-developed and well-nourished.  HENT:  Head:    TM's intact bilaterally, no effusions or erythema. Nasal turbinates pink and moist, nasal passages patent. No sinus tenderness. Oropharynx clear, mucous membranes moist, dentition in good repair.  Eyes: Conjunctivae and EOM are normal. Pupils are equal, round, and reactive to light. Right eye exhibits no discharge. Left eye exhibits no discharge. No scleral icterus.  Neck: Normal range of motion. Neck supple. No thyromegaly present.  Cardiovascular: Normal rate, regular rhythm and intact distal pulses.  Exam reveals no gallop and no friction rub.    No murmur heard. Pulmonary/Chest: No stridor. No respiratory distress. He has no wheezes. He has no rales.  Abdominal: Soft. Bowel sounds are normal. He exhibits no distension and no mass. There is no tenderness.  Musculoskeletal: Normal range of motion. He exhibits no edema or tenderness.  Lymphadenopathy:    He has no cervical adenopathy.  Neurological: He is alert and oriented to person, place, and time. He has normal reflexes.  Skin: Skin is warm and dry. No rash noted. No erythema. No pallor.  Psychiatric: He has a normal mood and affect.   Results for orders placed or performed in visit on 11/11/15 (from the past 24 hour(s))  POCT CBC     Status: Abnormal   Collection Time: 11/11/15  1:06 PM  Result Value Ref Range   WBC 7.1 4.6 - 10.2 K/uL   Lymph, poc 1.9 0.6 - 3.4   POC LYMPH PERCENT 27.3 10 - 50 %L   MID (cbc) 0.3 0 - 0.9   POC MID % 4.7 0 - 12 %M   POC Granulocyte 4.8 2 - 6.9   Granulocyte percent 68.0 37 - 80 %G   RBC 4.03 (A) 4.69 - 6.13 M/uL   Hemoglobin 15.8 14.1 - 18.1 g/dL   HCT, POC 78.245.9 95.643.5 - 53.7 %   MCV 91.3 80 - 97 fL   MCH, POC 31.5 (A) 27 - 31.2 pg   MCHC 34.5 31.8 - 35.4 g/dL   RDW, POC 21.313.0 %   Platelet Count, POC 198 142 - 424 K/uL  MPV 6.8 0 - 99.8 fL   Dg Ankle Complete Left  11/11/2015  CLINICAL DATA:  Soft tissue swelling EXAM: LEFT ANKLE COMPLETE - 3+ VIEW COMPARISON:  None. FINDINGS: Frontal, oblique, and lateral views were obtained. There is generalized soft tissue swelling, slightly more laterally than medially. There is no fracture or joint effusion. The ankle mortise appears intact. There is mild spurring in the dorsal midfoot. There is narrowing in the medial aspect of the ankle joint. No erosive change. There is mild calcification in the distal Achilles tendon. IMPRESSION: Soft tissue swelling. Areas of osteoarthritic change. No fracture or joint effusion. Ankle mortise appears intact. Mild distal Achilles tendinosis. Electronically Signed    By: Bretta BangWilliam  Woodruff III M.D.   On: 11/11/2015 13:39   Assessment and Plan :   1. Annual physical exam - Medically stable. Discussed healthy lifestyle, diet, exercise, preventative care, vaccinations, and addressed patient's concerns.   2. Achilles tendinitis of left lower extremity 3. Ankle swelling, left - Undergoing inflammatory process, start Anaprox, rest. Note for work provided. Consider using prednisone course.  4. Lesion of skin of face - Ambulatory referral to Dermatology  5. Colon cancer screening - Ambulatory referral to Gastroenterology  6. Need for Tdap vaccination - Tdap vaccine greater than or equal to 7yo IM  7. Need for prophylactic vaccination against Streptococcus pneumoniae (pneumococcus) - Pneumococcal conjugate vaccine 13-valent IM  8. Need for shingles vaccine - Zoster Vaccine Live, PF, (ZOSTAVAX) [id number] UNT/0.65ML injection; Inject 19,400 Units into the skin once.  Dispense: 1 each; Refill: 0   Wallis BambergMario Aaleah Hirsch, PA-C [facility] [phone] 11/11/2015  12:44 PM

## 2015-11-12 ENCOUNTER — Other Ambulatory Visit: Payer: Self-pay | Admitting: Urgent Care

## 2015-11-12 DIAGNOSIS — M109 Gout, unspecified: Secondary | ICD-10-CM

## 2015-11-12 LAB — CBC
HCT: 46.2 % (ref 38.5–50.0)
Hemoglobin: 15.8 g/dL (ref 13.2–17.1)
MCH: 31.2 pg (ref 27.0–33.0)
MCHC: 34.2 g/dL (ref 32.0–36.0)
MCV: 91.3 fL (ref 80.0–100.0)
MPV: 9.7 fL (ref 7.5–12.5)
PLATELETS: 225 10*3/uL (ref 140–400)
RBC: 5.06 MIL/uL (ref 4.20–5.80)
RDW: 13.7 % (ref 11.0–15.0)
WBC: 6.7 10*3/uL (ref 3.8–10.8)

## 2015-11-12 LAB — COMPREHENSIVE METABOLIC PANEL
ALT: 22 U/L (ref 9–46)
AST: 20 U/L (ref 10–35)
Albumin: 4.2 g/dL (ref 3.6–5.1)
Alkaline Phosphatase: 69 U/L (ref 40–115)
BILIRUBIN TOTAL: 0.7 mg/dL (ref 0.2–1.2)
BUN: 13 mg/dL (ref 7–25)
CHLORIDE: 100 mmol/L (ref 98–110)
CO2: 25 mmol/L (ref 20–31)
CREATININE: 1.01 mg/dL (ref 0.70–1.25)
Calcium: 9.5 mg/dL (ref 8.6–10.3)
GLUCOSE: 112 mg/dL — AB (ref 65–99)
POTASSIUM: 4.7 mmol/L (ref 3.5–5.3)
SODIUM: 137 mmol/L (ref 135–146)
Total Protein: 7.3 g/dL (ref 6.1–8.1)

## 2015-11-12 LAB — LIPID PANEL
CHOL/HDL RATIO: 3.5 ratio (ref <=5.0)
CHOLESTEROL: 173 mg/dL (ref 125–200)
HDL: 50 mg/dL (ref >=40)
LDL Cholesterol: 95 mg/dL (ref <130)
TRIGLYCERIDES: 140 mg/dL (ref <150)
VLDL: 28 mg/dL (ref <30)

## 2015-11-12 LAB — URIC ACID: Uric Acid, Serum: 8.2 mg/dL — ABNORMAL HIGH (ref 4.0–8.0)

## 2015-11-12 LAB — PSA: PSA: 0.33 ng/mL (ref <=4.00)

## 2015-11-12 LAB — TSH: TSH: 2.25 mIU/L (ref 0.40–4.50)

## 2015-11-12 MED ORDER — COLCHICINE 0.6 MG PO TABS: ORAL_TABLET | ORAL | Status: DC

## 2015-11-22 ENCOUNTER — Encounter: Payer: Self-pay | Admitting: *Deleted

## 2015-11-26 ENCOUNTER — Encounter: Payer: Self-pay | Admitting: *Deleted

## 2016-12-08 ENCOUNTER — Observation Stay (HOSPITAL_BASED_OUTPATIENT_CLINIC_OR_DEPARTMENT_OTHER)
Admission: EM | Admit: 2016-12-08 | Discharge: 2016-12-09 | Disposition: A | Discharge status: Home / Self Care | Payer: PRIVATE HEALTH INSURANCE | Attending: Internal Medicine | Admitting: Internal Medicine

## 2016-12-08 ENCOUNTER — Emergency Department (HOSPITAL_BASED_OUTPATIENT_CLINIC_OR_DEPARTMENT_OTHER): Payer: PRIVATE HEALTH INSURANCE

## 2016-12-08 ENCOUNTER — Encounter (HOSPITAL_BASED_OUTPATIENT_CLINIC_OR_DEPARTMENT_OTHER): Payer: Self-pay | Admitting: Emergency Medicine

## 2016-12-08 DIAGNOSIS — Z7902 Long term (current) use of antithrombotics/antiplatelets: Secondary | ICD-10-CM | POA: Diagnosis not present

## 2016-12-08 DIAGNOSIS — E785 Hyperlipidemia, unspecified: Secondary | ICD-10-CM | POA: Insufficient documentation

## 2016-12-08 DIAGNOSIS — M109 Gout, unspecified: Secondary | ICD-10-CM | POA: Insufficient documentation

## 2016-12-08 DIAGNOSIS — I700 Atherosclerosis of aorta: Secondary | ICD-10-CM | POA: Insufficient documentation

## 2016-12-08 DIAGNOSIS — I6622 Occlusion and stenosis of left posterior cerebral artery: Secondary | ICD-10-CM | POA: Diagnosis not present

## 2016-12-08 DIAGNOSIS — Z6831 Body mass index (BMI) 31.0-31.9, adult: Secondary | ICD-10-CM | POA: Diagnosis not present

## 2016-12-08 DIAGNOSIS — G459 Transient cerebral ischemic attack, unspecified: Principal | ICD-10-CM | POA: Insufficient documentation

## 2016-12-08 DIAGNOSIS — R202 Paresthesia of skin: Secondary | ICD-10-CM

## 2016-12-08 DIAGNOSIS — Z79899 Other long term (current) drug therapy: Secondary | ICD-10-CM | POA: Diagnosis not present

## 2016-12-08 DIAGNOSIS — R200 Anesthesia of skin: Secondary | ICD-10-CM

## 2016-12-08 DIAGNOSIS — R42 Dizziness and giddiness: Secondary | ICD-10-CM

## 2016-12-08 DIAGNOSIS — E119 Type 2 diabetes mellitus without complications: Secondary | ICD-10-CM | POA: Insufficient documentation

## 2016-12-08 DIAGNOSIS — Z7952 Long term (current) use of systemic steroids: Secondary | ICD-10-CM | POA: Diagnosis not present

## 2016-12-08 DIAGNOSIS — I10 Essential (primary) hypertension: Secondary | ICD-10-CM | POA: Diagnosis present

## 2016-12-08 DIAGNOSIS — E669 Obesity, unspecified: Secondary | ICD-10-CM | POA: Diagnosis not present

## 2016-12-08 HISTORY — DX: Essential (primary) hypertension: I10

## 2016-12-08 LAB — COMPREHENSIVE METABOLIC PANEL
ALBUMIN: 3.9 g/dL (ref 3.5–5.0)
ALK PHOS: 64 U/L (ref 38–126)
ALT: 36 U/L (ref 17–63)
AST: 44 U/L — AB (ref 15–41)
Anion gap: 10 (ref 5–15)
BILIRUBIN TOTAL: 0.6 mg/dL (ref 0.3–1.2)
BUN: 12 mg/dL (ref 6–20)
CALCIUM: 9.2 mg/dL (ref 8.9–10.3)
CO2: 26 mmol/L (ref 22–32)
Chloride: 100 mmol/L — ABNORMAL LOW (ref 101–111)
Creatinine, Ser: 0.93 mg/dL (ref 0.61–1.24)
GFR calc Af Amer: >60 mL/min (ref >60)
GLUCOSE: 98 mg/dL (ref 65–99)
Potassium: 4 mmol/L (ref 3.5–5.1)
Sodium: 136 mmol/L (ref 135–145)
Total Protein: 7.6 g/dL (ref 6.5–8.1)

## 2016-12-08 LAB — CBC WITH DIFFERENTIAL/PLATELET
Basophils Absolute: 0 10*3/uL (ref 0.0–0.1)
Basophils Relative: 1 %
Eosinophils Absolute: 0.1 10*3/uL (ref 0.0–0.7)
Eosinophils Relative: 2 %
HEMATOCRIT: 47.1 % (ref 39.0–52.0)
HEMOGLOBIN: 16.3 g/dL (ref 13.0–17.0)
LYMPHS PCT: 43 %
Lymphs Abs: 2.6 10*3/uL (ref 0.7–4.0)
MCH: 31.7 pg (ref 26.0–34.0)
MCHC: 34.6 g/dL (ref 30.0–36.0)
MCV: 91.5 fL (ref 78.0–100.0)
MONOS PCT: 12 %
Monocytes Absolute: 0.7 10*3/uL (ref 0.1–1.0)
NEUTROS ABS: 2.5 10*3/uL (ref 1.7–7.7)
NEUTROS PCT: 42 %
Platelets: 196 10*3/uL (ref 150–400)
RBC: 5.15 MIL/uL (ref 4.22–5.81)
RDW: 13 % (ref 11.5–15.5)
WBC: 6 10*3/uL (ref 4.0–10.5)

## 2016-12-08 LAB — TROPONIN I

## 2016-12-08 MED ORDER — ACETAMINOPHEN 325 MG PO TABS
650.0000 mg | ORAL_TABLET | Freq: Once | ORAL | Status: AC
  Administered 2016-12-08: 650 mg via ORAL
  Filled 2016-12-08: qty 2

## 2016-12-08 MED ORDER — ASPIRIN EC 325 MG PO TBEC
650.0000 mg | DELAYED_RELEASE_TABLET | Freq: Once | ORAL | Status: AC
  Administered 2016-12-08: 650 mg via ORAL
  Filled 2016-12-08: qty 2

## 2016-12-08 NOTE — ED Notes (Signed)
Attempted to call report to admitting floor , no answer x 2

## 2016-12-08 NOTE — ED Notes (Signed)
ED Provider at bedside. 

## 2016-12-08 NOTE — ED Triage Notes (Signed)
Pt sts he was at work when he became dizzy and had numbness in LUE and LLE at approx 1945; c/o HA now, but sts other sxs have resolved.

## 2016-12-08 NOTE — ED Notes (Signed)
Patient transported to CT 

## 2016-12-08 NOTE — ED Provider Notes (Signed)
[facility] Provider Note   CSN: [id number] Arrival date & time: 12/08/16  2034  By signing my name below, I, Roberto DarnerRussell [name], attest that this documentation has been prepared under the direction and in the presence of Mahmood Boehringer, PA-C. Electronically Signed: Linna Darnerussell [name], Scribe. 12/08/2016. 9:40 PM.  History   Chief Complaint Chief Complaint  Patient presents with  . Dizziness   The history is provided by the patient. No language interpreter was used.    HPI Comments: Roberto [name] is a 66 y.o. male with PMHx including HTN who presents to the Emergency Department complaining of sudden onset, persistent dizziness and lightheadedness beginning around 7:45 PM tonight. Patient was at work in his maintenance shop and became significantly dizzy and lightheaded after walking about 50 feet. At that time he also developed numbness and a heaviness in his left leg and left arm along with a bilateral, throbbing, 8/10 headache. He also endorses weakness in the left hand. He sat down and these symptoms continued for about 5 minutes. He states that his headache then began to improve and his other symptoms resolved. He currently complains of a 3/10, throbbing, bilateral frontal headache.  He has no prior h/o the same. No recent falls or head trauma. No recent illnesses. No h/o DM. Patient is a non-smoker and drinks about one beer daily. NKDA. He denies aphasia, facial droop, hearing changes, vision changes, N/V/D, chest pain, dyspnea, or any other associated symptoms.  Past Medical History:  Diagnosis Date  . Anemia   . Glaucoma   . Hypertension     Patient Active Problem List   Diagnosis Date Noted  . TIA (transient ischemic attack) 12/08/2016  . Rosacea 12/31/2013    Past Surgical History:  Procedure Laterality Date  . HERNIA REPAIR         Home Medications    Prior to Admission medications   Medication Sig Start Date End Date Taking? Authorizing Provider  colchicine 0.6 MG  tablet Take 2 tablets now, then 1 more tablet 1 hour later. Wait 3 days before repeating this dosing again. 11/12/15   Wallis BambergMani, Mario, PA-C  naproxen sodium (ANAPROX DS) 550 MG tablet Take 1 tablet (550 mg total) by mouth 2 (two) times daily with a meal. 11/11/15   Wallis BambergMani, Mario, PA-C  predniSONE (DELTASONE) 20 MG tablet Take 2 tablets daily with breakfast. 11/11/15   Wallis BambergMani, Mario, PA-C  Zoster Vaccine Live, PF, (ZOSTAVAX) [id number] UNT/0.65ML injection Inject 19,400 Units into the skin once. 11/11/15   Wallis BambergMani, Mario, PA-C    Family History Family History  Problem Relation Age of Onset  . Heart disease Mother   . Stroke Father     Social History Social History  Substance Use Topics  . Smoking status: Never Smoker  . Smokeless tobacco: Never Used  . Alcohol use 0.6 oz/week    1 Cans of beer per week     Comment: daily     Allergies   Patient has no known allergies.   Review of Systems Review of Systems  Constitutional: Negative for fever.  HENT: Negative for hearing loss and tinnitus.   Eyes: Negative for visual disturbance.  Respiratory: Negative for shortness of breath.   Cardiovascular: Negative for chest pain.  Gastrointestinal: Negative for diarrhea, nausea and vomiting.  Musculoskeletal: Negative for neck pain.  Neurological: Positive for dizziness, weakness (left hand), light-headedness, numbness (and tingling) and headaches. Negative for syncope, facial asymmetry and speech difficulty.  All other systems reviewed and are negative.  Physical Exam Updated Vital Signs BP (!) 168/88 (BP Location: Right Arm)   Pulse 89   Temp 98.7 F (37.1 C) (Oral)   Resp 18   Ht 5\' 6"  (1.676 m)   Wt 185 lb (83.9 kg)   SpO2 99%   BMI 29.86 kg/m   Physical Exam  Constitutional: He is oriented to person, place, and time. He appears well-developed and well-nourished. No distress.  HENT:  Head: Normocephalic and atraumatic.  Eyes: Pupils are equal, round, and reactive to light. Conjunctivae  and EOM are normal.  Neck: Normal range of motion. Neck supple.  Cardiovascular: Normal rate, regular rhythm, normal heart sounds and intact distal pulses.   Pulmonary/Chest: Effort normal and breath sounds normal. No respiratory distress.  Abdominal: Soft. There is no tenderness. There is no guarding.  Musculoskeletal: He exhibits no edema.  Normal motor function intact in all extremities and spine. No midline spinal tenderness.   Lymphadenopathy:    He has no cervical adenopathy.  Neurological: He is alert and oriented to person, place, and time.  No sensory deficits. Strength 5/5 in all extremities. No gait disturbance. Coordination intact including heel to shin and finger to nose. Cranial nerves III-XII grossly intact. No facial droop. Negative Romberg.  Skin: Skin is warm and dry. Capillary refill takes less than 2 seconds. He is not diaphoretic.  Psychiatric: He has a normal mood and affect. His behavior is normal.  Nursing note and vitals reviewed.  ED Treatments / Results  Labs (all labs ordered are listed, but only abnormal results are displayed) Labs Reviewed  COMPREHENSIVE METABOLIC PANEL - Abnormal; Notable for the following:       Result Value   Chloride 100 (*)    AST 44 (*)    All other components within normal limits  CBC WITH DIFFERENTIAL/PLATELET  TROPONIN I  URINALYSIS, ROUTINE W REFLEX MICROSCOPIC    EKG  EKG Interpretation  Date/Time:  Wednesday December 08 2016 20:42:26 EDT Ventricular Rate:  90 PR Interval:  154 QRS Duration: 86 QT Interval:  360 QTC Calculation: 440 R Axis:   2 Text Interpretation:  Normal sinus rhythm Possible Anterior infarct , age undetermined Abnormal ECG no prior available for comparison Confirmed by Tilden Fossaees, Elizabeth ([id number]) on 12/08/2016 8:44:45 PM       Radiology Dg Chest 2 View  Result Date: 12/08/2016 CLINICAL DATA:  Dizziness EXAM: CHEST  2 VIEW COMPARISON:  None. FINDINGS: Cardiac shadow is within normal limits. Aortic  calcifications are seen. The lungs are clear bilaterally. No acute bony abnormality is noted. IMPRESSION: No acute abnormality noted. Aortic Atherosclerosis (ICD10-170.0) Electronically Signed   By: Alcide CleverMark  Lukens M.D.   On: 12/08/2016 21:56   Ct Head Wo Contrast  Result Date: 12/08/2016 CLINICAL DATA:  Dizziness with left upper and lower extremity numbness, frontal headache EXAM: CT HEAD WITHOUT CONTRAST TECHNIQUE: Contiguous axial images were obtained from the base of the skull through the vertex without intravenous contrast. COMPARISON:  None. FINDINGS: Brain: No evidence of acute infarction, hemorrhage, hydrocephalus, extra-axial collection or mass lesion/mass effect. Vascular: No hyperdense vessels. Scattered calcifications at the carotid siphons. Skull: Normal. Negative for fracture or focal lesion. Sinuses/Orbits: No acute finding. Other: None IMPRESSION: No definite CT evidence for acute intracranial abnormality. Electronically Signed   By: Jasmine PangKim  Fujinaga M.D.   On: 12/08/2016 22:08    Procedures Procedures (including critical care time)  DIAGNOSTIC STUDIES: Oxygen Saturation is 99% on RA, normal by my interpretation.    COORDINATION OF CARE:  9:39 PM Discussed treatment plan with pt at bedside and pt agreed to plan.  Medications Ordered in ED Medications  aspirin EC tablet 650 mg (not administered)  acetaminophen (TYLENOL) tablet 650 mg (650 mg Oral Given 12/08/16 2140)     Initial Impression / Assessment and Plan / ED Course  I have reviewed the triage vital signs and the nursing notes.  Pertinent labs & imaging results that were available during my care of the patient were reviewed by me and considered in my medical decision making (see chart for details).  Clinical Course as of Dec 08 2313  Wed Dec 08, 2016  2303 Dr. Madilyn Hook spoke with Dr. Otelia Limes, neurologist, who recommends administering 650mg  ASA and admission via medicine service at [facility]. Requests that medicine service  contacts him upon patient's arrival on the floor.   [SJ]  2312 Spoke with Dr. Toniann Fail, who agrees to admit the patient.   [SJ]    Clinical Course User Index [SJ] Coen Miyasato C, PA-C    Patient presents with transient neurologic deficits. Admission for TIA workup.    Findings and plan of care discussed with Tilden Fossa, MD. Dr. Madilyn Hook personally evaluated and examined this patient.  Vitals:   12/08/16 2038 12/08/16 2040 12/08/16 2100  BP:  (!) 168/88 (!) 130/118  Pulse:  89 88  Resp:  18 13  Temp:  98.7 F (37.1 C)   TempSrc:  Oral   SpO2:  99% 98%  Weight: 83.9 kg (185 lb)    Height: 5\' 6"  (1.676 m)       Final Clinical Impressions(s) / ED Diagnoses   Final diagnoses:  Dizziness  Numbness and tingling of left arm and leg    New Prescriptions New Prescriptions   No medications on file   I personally performed the services described in this documentation, which was scribed in my presence. The recorded information has been reviewed and is accurate.   Anselm Pancoast, PA-C 12/08/16 2316    Tilden Fossa, MD 12/13/16 1550

## 2016-12-09 ENCOUNTER — Observation Stay (HOSPITAL_BASED_OUTPATIENT_CLINIC_OR_DEPARTMENT_OTHER): Payer: PRIVATE HEALTH INSURANCE

## 2016-12-09 ENCOUNTER — Observation Stay (HOSPITAL_COMMUNITY): Payer: PRIVATE HEALTH INSURANCE

## 2016-12-09 DIAGNOSIS — G459 Transient cerebral ischemic attack, unspecified: Principal | ICD-10-CM

## 2016-12-09 DIAGNOSIS — M109 Gout, unspecified: Secondary | ICD-10-CM

## 2016-12-09 DIAGNOSIS — I10 Essential (primary) hypertension: Secondary | ICD-10-CM | POA: Diagnosis not present

## 2016-12-09 DIAGNOSIS — R202 Paresthesia of skin: Secondary | ICD-10-CM

## 2016-12-09 DIAGNOSIS — R200 Anesthesia of skin: Secondary | ICD-10-CM

## 2016-12-09 LAB — URINALYSIS, ROUTINE W REFLEX MICROSCOPIC
BILIRUBIN URINE: NEGATIVE
Glucose, UA: NEGATIVE mg/dL
HGB URINE DIPSTICK: NEGATIVE
Ketones, ur: NEGATIVE mg/dL
Leukocytes, UA: NEGATIVE
Nitrite: NEGATIVE
Protein, ur: NEGATIVE mg/dL
SPECIFIC GRAVITY, URINE: 1.017 (ref 1.005–1.030)
pH: 5 (ref 5.0–8.0)

## 2016-12-09 LAB — LIPID PANEL
CHOLESTEROL: 151 mg/dL (ref 0–200)
HDL: 43 mg/dL (ref >40)
LDL Cholesterol: 81 mg/dL (ref 0–99)
TRIGLYCERIDES: 135 mg/dL (ref <150)
Total CHOL/HDL Ratio: 3.5 RATIO
VLDL: 27 mg/dL (ref 0–40)

## 2016-12-09 LAB — RAPID URINE DRUG SCREEN, HOSP PERFORMED
Amphetamines: NOT DETECTED
BARBITURATES: NOT DETECTED
Benzodiazepines: NOT DETECTED
Cocaine: NOT DETECTED
Opiates: NOT DETECTED
TETRAHYDROCANNABINOL: NOT DETECTED

## 2016-12-09 MED ORDER — ENOXAPARIN SODIUM 40 MG/0.4ML ~~LOC~~ SOLN
40.0000 mg | SUBCUTANEOUS | Status: DC
  Administered 2016-12-09: 40 mg via SUBCUTANEOUS
  Filled 2016-12-09: qty 0.4

## 2016-12-09 MED ORDER — HYDRALAZINE HCL 20 MG/ML IJ SOLN: 5.0000 mg | INTRAMUSCULAR | Status: DC | PRN

## 2016-12-09 MED ORDER — SODIUM CHLORIDE 0.9 % IV SOLN: INTRAVENOUS | Status: DC

## 2016-12-09 MED ORDER — ZOLPIDEM TARTRATE 5 MG PO TABS: 5.0000 mg | ORAL_TABLET | Freq: Every evening | ORAL | Status: DC | PRN

## 2016-12-09 MED ORDER — STROKE: EARLY STAGES OF RECOVERY BOOK
Freq: Once | Status: AC
  Administered 2016-12-09: 04:00:00
  Filled 2016-12-09: qty 1

## 2016-12-09 MED ORDER — ACETAMINOPHEN 650 MG RE SUPP: 650.0000 mg | RECTAL | Status: DC | PRN

## 2016-12-09 MED ORDER — ASPIRIN 325 MG PO TABS
325.0000 mg | ORAL_TABLET | Freq: Every day | ORAL | Status: DC
  Administered 2016-12-09: 325 mg via ORAL
  Filled 2016-12-09: qty 1

## 2016-12-09 MED ORDER — AMLODIPINE BESYLATE 5 MG PO TABS: 5.0000 mg | ORAL_TABLET | Freq: Every day | ORAL | 0 refills | Status: DC

## 2016-12-09 MED ORDER — ASPIRIN 325 MG PO TABS: 325.0000 mg | ORAL_TABLET | Freq: Every day | ORAL | 0 refills | Status: DC

## 2016-12-09 MED ORDER — ACETAMINOPHEN 160 MG/5ML PO SOLN: 650.0000 mg | ORAL | Status: DC | PRN

## 2016-12-09 MED ORDER — ATORVASTATIN CALCIUM 40 MG PO TABS: 40.0000 mg | ORAL_TABLET | Freq: Every day | ORAL | 0 refills | Status: DC

## 2016-12-09 MED ORDER — ACETAMINOPHEN 325 MG PO TABS: 650.0000 mg | ORAL_TABLET | ORAL | Status: DC | PRN

## 2016-12-09 MED ORDER — ONDANSETRON HCL 4 MG/2ML IJ SOLN: 4.0000 mg | Freq: Three times a day (TID) | INTRAMUSCULAR | Status: DC | PRN

## 2016-12-09 MED ORDER — ASPIRIN 300 MG RE SUPP: 300.0000 mg | Freq: Every day | RECTAL | Status: DC

## 2016-12-09 MED ORDER — SODIUM CHLORIDE 0.9 % IV SOLN
INTRAVENOUS | Status: DC
  Administered 2016-12-09: 04:00:00 via INTRAVENOUS

## 2016-12-09 MED ORDER — SENNOSIDES-DOCUSATE SODIUM 8.6-50 MG PO TABS: 1.0000 | ORAL_TABLET | Freq: Every evening | ORAL | Status: DC | PRN

## 2016-12-09 MED ORDER — ATORVASTATIN CALCIUM 40 MG PO TABS
40.0000 mg | ORAL_TABLET | Freq: Every day | ORAL | Status: DC
  Administered 2016-12-09: 40 mg via ORAL
  Filled 2016-12-09: qty 1

## 2016-12-09 NOTE — H&P (Signed)
History and Physical    Roberto [name] OZD:[id number] DOB: 12-21-1950 DOA: 12/08/2016  Referring MD/NP/PA:   PCP: Patient, No Pcp Per   Patient coming from:  The patient is coming from home.  At baseline, pt is independent for most of ADL.    Chief Complaint: Left-sided numbness  HPI: Roberto [name] is a 66 y.o. male with medical history significant of hypertension, gout, glaucoma, who presents with left-sided numbness.  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 At that time he also developed numbness and heaviness in his left leg and left arm. He had left hand weakness. He also had vomiting frontal headache, 8 out of 10 in severity. His symptoms lasted for several minutes, then resolved spontaneously. Patient denies facial droop, slurred speech, vision change or hearing loss. Patient does not have chest pain, shortness breath, cough, fever, chills, nausea, vomiting, diarrhea, abdominal pain. No symptoms of UTI.  ED Course: pt was found to havenegative troponin, WBC 6.0, electrolytes renal function okay, temperature normal, no tachycardia, O2 sat 98% on room air, blood pressure 180/120-->159/95, negative chest x-ray, negative CT head for acute intracranial abnormalities. Patient is placed on telemetry bed for observation. Neurology, Dr. Otelia Limes was consulted.   Review of Systems:   General: no fevers, chills, no changes in body weight, has fatigue HEENT: no blurry vision, hearing changes or sore throat Respiratory: no dyspnea, coughing, wheezing CV: no chest pain, no palpitations GI: no nausea, vomiting, abdominal pain, diarrhea, constipation GU: no dysuria, burning on urination, increased urinary frequency, hematuria  Ext: no leg edema Neuro: has left sided numbness and left hand weakness. No vision change or hearing loss Skin: has rosacea, no skin tear. MSK: No muscle spasm, no deformity, no limitation  of range of movement in spin Heme: No easy bruising.  Travel history: No recent long distant travel.Sincerely  Allergy: No Known Allergies  Past Medical History:  Diagnosis Date  . Anemia   . Glaucoma   . Hypertension     Past Surgical History:  Procedure Laterality Date  . HERNIA REPAIR      Social History:  reports that he has never smoked. He has never used smokeless tobacco. He reports that he drinks about 0.6 oz of alcohol per week . He reports that he does not use drugs.  Family History:  Family History  Problem Relation Age of Onset  . Heart disease Mother   . Stroke Father      Prior to Admission medications   Medication Sig Start Date End Date Taking? Authorizing Provider  colchicine 0.6 MG tablet Take 2 tablets now, then 1 more tablet 1 hour later. Wait 3 days before repeating this dosing again. 11/12/15   Wallis Bamberg, PA-C  naproxen sodium (ANAPROX DS) 550 MG tablet Take 1 tablet (550 mg total) by mouth 2 (two) times daily with a meal. 11/11/15   Wallis Bamberg, PA-C  predniSONE (DELTASONE) 20 MG tablet Take 2 tablets daily with breakfast. 11/11/15   Wallis Bamberg, PA-C  Zoster Vaccine Live, PF, (ZOSTAVAX) [id number] UNT/0.65ML injection Inject 19,400 Units into the skin once. 11/11/15   Wallis Bamberg, PA-C    Physical Exam: Vitals:   12/08/16 2330 12/09/16 0104 12/09/16 0130 12/09/16 0303  BP: (!) 152/104 (!) 173/94 (!) 159/95 (!) 150/89  Pulse: 81 63  67  Resp: (!) 23 20  20   Temp:  98.6 F (37 C)  98.7 F (37.1  C)  TempSrc:  Oral  Oral  SpO2: 99% 99%  95%  Weight:  87.2 kg (192 lb 3.2 oz)    Height:  5\' 6"  (1.676 m)     General: Not in acute distress HEENT:       Eyes: PERRL, EOMI, no scleral icterus.       ENT: No discharge from the ears and nose, no pharynx injection, no tonsillar enlargement.        Neck: No JVD, no bruit, no mass felt. Heme: No neck lymph node enlargement. Cardiac: S1/S2, RRR, No murmurs, No gallops or rubs. Respiratory: No rales, wheezing,  rhonchi or rubs. GI: Soft, nondistended, nontender, no rebound pain, no organomegaly, BS present. GU: No hematuria Ext: No pitting leg edema bilaterally. 2+DP/PT pulse bilaterally. Musculoskeletal: No joint deformities, No joint redness or warmth, no limitation of ROM in spin. Skin: has Rosacea. Neuro: Alert, oriented X3, cranial nerves II-XII grossly intact, moves all extremities normally. Muscle strength 5/5 in all extremities, sensation to light touch intact. Brachial reflex 2+ bilaterally. Negative Babinski's sign. Normal finger to nose test. Psych: Patient is not psychotic, no suicidal or hemocidal ideation.  Labs on Admission: I have personally reviewed following labs and imaging studies  CBC:  Recent Labs Lab 12/08/16 2049  WBC 6.0  NEUTROABS 2.5  HGB 16.3  HCT 47.1  MCV 91.5  PLT 196   Basic Metabolic Panel:  Recent Labs Lab 12/08/16 2049  NA 136  K 4.0  CL 100*  CO2 26  GLUCOSE 98  BUN 12  CREATININE 0.93  CALCIUM 9.2   GFR: Estimated Creatinine Clearance: 80.9 mL/min (by C-G formula based on SCr of 0.93 mg/dL). Liver Function Tests:  Recent Labs Lab 12/08/16 2049  AST 44*  ALT 36  ALKPHOS 64  BILITOT 0.6  PROT 7.6  ALBUMIN 3.9   No results for input(s): LIPASE, AMYLASE in the last 168 hours. No results for input(s): AMMONIA in the last 168 hours. Coagulation Profile: No results for input(s): INR, PROTIME in the last 168 hours. Cardiac Enzymes:  Recent Labs Lab 12/08/16 2049  TROPONINI <0.03   BNP (last 3 results) No results for input(s): PROBNP in the last 8760 hours. HbA1C: No results for input(s): HGBA1C in the last 72 hours. CBG: No results for input(s): GLUCAP in the last 168 hours. Lipid Profile: No results for input(s): CHOL, HDL, LDLCALC, TRIG, CHOLHDL, LDLDIRECT in the last 72 hours. Thyroid Function Tests: No results for input(s): TSH, T4TOTAL, FREET4, T3FREE, THYROIDAB in the last 72 hours. Anemia Panel: No results for  input(s): VITAMINB12, FOLATE, FERRITIN, TIBC, IRON, RETICCTPCT in the last 72 hours. Urine analysis: No results found for: COLORURINE, APPEARANCEUR, LABSPEC, PHURINE, GLUCOSEU, HGBUR, BILIRUBINUR, KETONESUR, PROTEINUR, UROBILINOGEN, NITRITE, LEUKOCYTESUR Sepsis Labs: @LABRCNTIP (procalcitonin:4,lacticidven:4) )No results found for this or any previous visit (from the past 240 hour(s)).   Radiological Exams on Admission: Dg Chest 2 View  Result Date: 12/08/2016 CLINICAL DATA:  Dizziness EXAM: CHEST  2 VIEW COMPARISON:  None. FINDINGS: Cardiac shadow is within normal limits. Aortic calcifications are seen. The lungs are clear bilaterally. No acute bony abnormality is noted. IMPRESSION: No acute abnormality noted. Aortic Atherosclerosis (ICD10-170.0) Electronically Signed   By: Alcide CleverMark  Lukens M.D.   On: 12/08/2016 21:56   Ct Head Wo Contrast  Result Date: 12/08/2016 CLINICAL DATA:  Dizziness with left upper and lower extremity numbness, frontal headache EXAM: CT HEAD WITHOUT CONTRAST TECHNIQUE: Contiguous axial images were obtained from the base of the skull through the  vertex without intravenous contrast. COMPARISON:  None. FINDINGS: Brain: No evidence of acute infarction, hemorrhage, hydrocephalus, extra-axial collection or mass lesion/mass effect. Vascular: No hyperdense vessels. Scattered calcifications at the carotid siphons. Skull: Normal. Negative for fracture or focal lesion. Sinuses/Orbits: No acute finding. Other: None IMPRESSION: No definite CT evidence for acute intracranial abnormality. Electronically Signed   By: Jasmine PangKim  Fujinaga M.D.   On: 12/08/2016 22:08     EKG: Independently reviewed.  Sinus rhythm, QTC 448, anteroseptal infarction pattern.  Assessment/Plan Principal Problem:   TIA (transient ischemic attack) Active Problems:   Gout   Essential hypertension   TIA (transient ischemic attack): Patient's transient left sided numbness, left hand weakness and dizziness are concerning  for TIA. CT head is negative for acute intracranial abnormalities. Neurology, Dr. Otelia LimesLindzen was consulted.  - will place to tele bed - Neurology was consulted by EDP, will follow up recommendations. -Atrial fibrillation: not present  - Risk factor modification: HgbA1c, fasting lipid panel - MRI, MRA of the brain without contrast  - PT consult, OT consult - Bedside swallowing screen was ordered, will get speech consult in AM - 2 d Echocardiogram  - Ekg  - Carotid dopplers  - Aspirin - check UDS  Essential hypertension: Blood pressure was elevated 180/120-159/95. Patient is not taking any medications at home. -IV hydralazine when necessary -will wait for MRI of brain result, if not stroke-->will need to start oral Bp meds for better control  Gout: stable. Not taking meds. -No acute issues    DVT ppx: SQ Lovenox Code Status: Full code Family Communication: None at bed side.    Disposition Plan:  Anticipate discharge back to previous home environment Consults called:  Neuro, dr. Otelia LimesLindzen Admission status: Obs / tele   Date of Service 12/09/2016    Lorretta HarpIU, Jillana Selph [facility] Pager [phone]  If 7PM-7AM, please contact night-coverage [email or web address] Password [email or web address] 12/09/2016, 5:00 AM

## 2016-12-09 NOTE — Progress Notes (Signed)
New patient admitted from [facility] arrived on the unit verbally and oriented. Made comfortable in bed. Oriented patient to the room, cardiac monitor applied, CCMD notified and safety measures put in place. Awaiting on attending physician's orders.

## 2016-12-09 NOTE — Discharge Summary (Addendum)
Physician Discharge Summary  [name] ZOX:[id number] DOB: 04-04-1951 DOA: 12/08/2016  PCP: [facility] Admit date: 12/08/2016 Discharge date: 12/09/2016  Admitted From: Home Disposition:  Home  Addendum on 7/27: Patient's 2-D echo results were reviewed (results were faxed to me from the echo lab as there was some technical issues in uploading the results). Echo shows normal LVEF of 55-60% with mild LVH, no wall motion abnormality. No cardiac source of emboli.  The results were told to the patient on the phone.  Recommendations for Outpatient Follow-up:  1. Follow up with PCP in one week. 2. Ambulated a referral to neurology for follow-up in 6 weeks.  Home Health: None Equipment/Devices: None  Discharge Condition: Fair CODE STATUS: Full code Diet recommendation: Heart Healthy    Discharge Diagnoses:  Principal Problem:   TIA (transient ischemic attack)  Active Problems:   Gout   Essential hypertension  Brief narrative/history of present illness 66 year old male with history of gout, glaucoma, recently diagnosed hypertension (not started on medication) presented with acute onset of left-sided numbness. 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 He then developed sudden numbness and heaviness in his left arm and leg. This lasted for about 3 minutes. He denies any slurred speech, confusion, facial droop, change in vision or hearing loss. Denied any abdominal pain, nausea, vomiting, chest pain, shortness of breath, bowel or urinary symptoms. No recent illness or medication use. No prior similar symptoms.  In the ED patient was hypertensive with blood pressure of 180/120 mmHg. Blood work showed normal CBC and chemistry, negative troponin and normal chest x-ray. Head CT was negative for acute abnormalities. He did not have any symptoms while in the ED. Patient placed on  observation for TIA and neuro hospitalist consulted.  Hospital course Transient ischemic attack (TIA) Risk factors include hypertension and family history of stroke and heart disease. No further symptoms. Seen by PT/OT and no further need. MRI brain/MRA head was negative for acute intracranial abnormality, showed minimal chronic small vessel ischemic disease and severe proximal left P2 PCA stenosis. Carotid Doppler showed nonsignificant carotid stenosis (1-39% internal carotid artery stenosis bilaterally).  vertebral arteries were patent. -Patient's LDL was 81 and was started on Lipitor 40 mg daily for secondary stroke prevention.  Patient's 2-D echo has been done and results are pending. Patient wishes to be discharged home and I will follow-up on this and call the patient with results either later today or tomorrow.  Patient will be discharged on full dose aspirin and Lipitor. And related a referral to outpatient neurology has been made. Patient provided written instruction and resources on secondary stroke prevention and diet adherence. Also counseled on weight loss.  Essential hypertension Will start on amlodipine 5 mg daily. Medication should be titrated during outpatient follow-up.  Procedures: CT head MRI brain 2-D echo Carotid Doppler  Consults: Stroke  Family communication: Girlfriend at bedside  Disposition: Home  Discharge Instructions  Discharge Instructions    Ambulatory referral to Neurology    Complete by:  As directed    An appointment is requested in approximately: 6 weeks     Allergies as of 12/09/2016   No Known Allergies     Medication List    TAKE these medications   amLODipine 5 MG tablet Commonly known as:  NORVASC Take 1 tablet (5 mg total) by mouth daily.   aspirin 325 MG tablet Take 1 tablet (  325 mg total) by mouth daily.   atorvastatin 40 MG tablet Commonly known as:  LIPITOR Take 1 tablet (40 mg total) by mouth daily at 6 PM.       Follow-up Information    [facility] Follow up in 1 week(s).          No Known Allergies      Procedures/Studies: Dg Chest 2 View  Result Date: 12/08/2016 CLINICAL DATA:  Dizziness EXAM: CHEST  2 VIEW COMPARISON:  None. FINDINGS: Cardiac shadow is within normal limits. Aortic calcifications are seen. The lungs are clear bilaterally. No acute bony abnormality is noted. IMPRESSION: No acute abnormality noted. Aortic Atherosclerosis (ICD10-170.0) Electronically Signed   By: Alcide CleverMark  Lukens M.D.   On: 12/08/2016 21:56   Ct Head Wo Contrast  Result Date: 12/08/2016 CLINICAL DATA:  Dizziness with left upper and lower extremity numbness, frontal headache EXAM: CT HEAD WITHOUT CONTRAST TECHNIQUE: Contiguous axial images were obtained from the base of the skull through the vertex without intravenous contrast. COMPARISON:  None. FINDINGS: Brain: No evidence of acute infarction, hemorrhage, hydrocephalus, extra-axial collection or mass lesion/mass effect. Vascular: No hyperdense vessels. Scattered calcifications at the carotid siphons. Skull: Normal. Negative for fracture or focal lesion. Sinuses/Orbits: No acute finding. Other: None IMPRESSION: No definite CT evidence for acute intracranial abnormality. Electronically Signed   By: Jasmine PangKim  Fujinaga M.D.   On: 12/08/2016 22:08   Mr Brain Wo Contrast  Result Date: 12/09/2016 CLINICAL DATA:  Several minute episode of dizziness, headache, vomiting, and left leg and arm numbness and heaviness/ weakness. EXAM: MRI HEAD WITHOUT CONTRAST MRA HEAD WITHOUT CONTRAST TECHNIQUE: Multiplanar, multiecho pulse sequences of the brain and surrounding structures were obtained without intravenous contrast. Angiographic images of the head were obtained using MRA technique without contrast. COMPARISON:  Head CT 12/08/2016 FINDINGS: MRI HEAD FINDINGS Brain: There is no evidence of acute infarct, intracranial hemorrhage, mass, midline shift, or extra-axial fluid  collection. The ventricles and sulci are within normal limits for age. Small foci of T2 hyperintensity in the cerebral white matter are most notable in the periatrial regions and are nonspecific but compatible with minimal chronic small vessel ischemic disease. Vascular: Major intracranial vascular flow voids are preserved. Skull and upper cervical spine: Unremarkable bone marrow signal. Sinuses/Orbits: Unremarkable orbits. Paranasal sinuses and mastoid air cells are clear. Other: None. MRA HEAD FINDINGS The study is mildly motion degraded. The visualized distal vertebral arteries are patent to the basilar and codominant without evidence of significant stenosis. There is a patent right PICA, although its origin was not imaged. A grossly patent left AICA is also identified. SCA origins are patent bilaterally. The basilar artery is patent without evidence of significant stenosis. There is a moderately large left posterior communicating artery with hypoplastic left P1 segment. There is a severe proximal left P2 stenosis. No significant right PCA stenosis is identified. The distal left cervical ICA is tortuous. The intracranial ICAs are patent without evidence of significant stenosis. The ACAs and MCAs are patent without evidence of proximal branch occlusion or significant stenosis. There is a patent anterior communicating artery. No intracranial aneurysm is identified. IMPRESSION: 1. No acute intracranial abnormality. 2. Minimal chronic small vessel ischemic disease. 3. Severe proximal left P2 PCA stenosis. Electronically Signed   By: Sebastian AcheAllen  Grady M.D.   On: 12/09/2016 12:54   Mr Maxine GlennMra Head/brain ZOWo Cm  Result Date: 12/09/2016 CLINICAL DATA:  Several minute episode of dizziness, headache, vomiting, and left leg and arm numbness and  heaviness/ weakness. EXAM: MRI HEAD WITHOUT CONTRAST MRA HEAD WITHOUT CONTRAST TECHNIQUE: Multiplanar, multiecho pulse sequences of the brain and surrounding structures were obtained  without intravenous contrast. Angiographic images of the head were obtained using MRA technique without contrast. COMPARISON:  Head CT 12/08/2016 FINDINGS: MRI HEAD FINDINGS Brain: There is no evidence of acute infarct, intracranial hemorrhage, mass, midline shift, or extra-axial fluid collection. The ventricles and sulci are within normal limits for age. Small foci of T2 hyperintensity in the cerebral white matter are most notable in the periatrial regions and are nonspecific but compatible with minimal chronic small vessel ischemic disease. Vascular: Major intracranial vascular flow voids are preserved. Skull and upper cervical spine: Unremarkable bone marrow signal. Sinuses/Orbits: Unremarkable orbits. Paranasal sinuses and mastoid air cells are clear. Other: None. MRA HEAD FINDINGS The study is mildly motion degraded. The visualized distal vertebral arteries are patent to the basilar and codominant without evidence of significant stenosis. There is a patent right PICA, although its origin was not imaged. A grossly patent left AICA is also identified. SCA origins are patent bilaterally. The basilar artery is patent without evidence of significant stenosis. There is a moderately large left posterior communicating artery with hypoplastic left P1 segment. There is a severe proximal left P2 stenosis. No significant right PCA stenosis is identified. The distal left cervical ICA is tortuous. The intracranial ICAs are patent without evidence of significant stenosis. The ACAs and MCAs are patent without evidence of proximal branch occlusion or significant stenosis. There is a patent anterior communicating artery. No intracranial aneurysm is identified. IMPRESSION: 1. No acute intracranial abnormality. 2. Minimal chronic small vessel ischemic disease. 3. Severe proximal left P2 PCA stenosis. Electronically Signed   By: Sebastian Ache M.D.   On: 12/09/2016 12:54       Subjective: Denies further numbness of the left  arm or leg.  Discharge Exam: Vitals:   12/09/16 0900 12/09/16 1300  BP: (!) 143/90 (!) 177/98  Pulse: 72 61  Resp: 18 18  Temp: 98.2 F (36.8 C) 98.2 F (36.8 C)   Vitals:   12/09/16 0500 12/09/16 0650 12/09/16 0900 12/09/16 1300  BP: (!) 144/88 (!) 145/86 (!) 143/90 (!) 177/98  Pulse: 82 67 72 61  Resp: 18 18 18 18   Temp: 97.7 F (36.5 C) 97.7 F (36.5 C) 98.2 F (36.8 C) 98.2 F (36.8 C)  TempSrc: Oral Oral Oral Oral  SpO2: 98% 99% 95% 98%  Weight:      Height:        General: Elderly male not in distress   HEENT: Moist mucosa, supple neck Chest: Clear to auscultation bilaterally  CVS: Normal S1 and S2, no murmurs, rubs or gallop   GI: Soft, nondistended, nontender Musculoskeletal: Warm, no edema CNS: Alert and oriented, nonfocal exam    The results of significant diagnostics from this hospitalization (including imaging, microbiology, ancillary and laboratory) are listed below for reference.     Microbiology: No results found for this or any previous visit (from the past 240 hour(s)).   Labs: BNP (last 3 results) No results for input(s): BNP in the last 8760 hours. Basic Metabolic Panel:  Recent Labs Lab 12/08/16 2049  NA 136  K 4.0  CL 100*  CO2 26  GLUCOSE 98  BUN 12  CREATININE 0.93  CALCIUM 9.2   Liver Function Tests:  Recent Labs Lab 12/08/16 2049  AST 44*  ALT 36  ALKPHOS 64  BILITOT 0.6  PROT 7.6  ALBUMIN 3.9  No results for input(s): LIPASE, AMYLASE in the last 168 hours. No results for input(s): AMMONIA in the last 168 hours. CBC:  Recent Labs Lab 12/08/16 2049  WBC 6.0  NEUTROABS 2.5  HGB 16.3  HCT 47.1  MCV 91.5  PLT 196   Cardiac Enzymes:  Recent Labs Lab 12/08/16 2049  TROPONINI <0.03   BNP: Invalid input(s): POCBNP CBG: No results for input(s): GLUCAP in the last 168 hours. D-Dimer No results for input(s): DDIMER in the last 72 hours. Hgb A1c No results for input(s): HGBA1C in the last 72  hours. Lipid Profile  Recent Labs  12/09/16 0425  CHOL 151  HDL 43  LDLCALC 81  TRIG 135  CHOLHDL 3.5   Thyroid function studies No results for input(s): TSH, T4TOTAL, T3FREE, THYROIDAB in the last 72 hours.  Invalid input(s): FREET3 Anemia work up No results for input(s): VITAMINB12, FOLATE, FERRITIN, TIBC, IRON, RETICCTPCT in the last 72 hours. Urinalysis    Component Value Date/Time   COLORURINE YELLOW 12/09/2016 0900   APPEARANCEUR CLEAR 12/09/2016 0900   LABSPEC 1.017 12/09/2016 0900   PHURINE 5.0 12/09/2016 0900   GLUCOSEU NEGATIVE 12/09/2016 0900   HGBUR NEGATIVE 12/09/2016 0900   BILIRUBINUR NEGATIVE 12/09/2016 0900   KETONESUR NEGATIVE 12/09/2016 0900   PROTEINUR NEGATIVE 12/09/2016 0900   NITRITE NEGATIVE 12/09/2016 0900   LEUKOCYTESUR NEGATIVE 12/09/2016 0900   Sepsis Labs Invalid input(s): PROCALCITONIN,  WBC,  LACTICIDVEN Microbiology No results found for this or any previous visit (from the past 240 hour(s)).   Time coordinating discharge: < 30 minutes  SIGNED:   Eddie North, MD  [facility] 12/09/2016, 4:45 PM Pager   If 7PM-7AM, please contact night-coverage [email or web address] Password [email or web address]

## 2016-12-09 NOTE — Progress Notes (Signed)
*  PRELIMINARY RESULTS* Vascular Ultrasound Carotid Duplex (Doppler) has been completed.  Findings suggest 1-39% internal carotid artery stenosis bilaterally. Vertebral arteries are patent with antegrade flow.  12/09/2016 9:50 AM Gertie FeyMichelle Lylie Blacklock, BS, RVT, RDCS, RDMS

## 2016-12-09 NOTE — Progress Notes (Signed)
OT Cancellation Note  Patient Details Name: Roberto [name] MRN: [id number] DOB: 01/05/1951   Cancelled Treatment:    Reason Eval/Treat Not Completed: PT consulted, family consulted, OT screened, no needs identified, will sign off. Thank you for this referral.  Evern BioLaura J [facility] 12/09/2016, 2:47 PM

## 2016-12-09 NOTE — Progress Notes (Signed)
Discharge orders received.  Discharge instructions and follow-up appointments reviewed with the patient and his wife.  VSS upon discharge.  IV removed and education complete.  All belongings sent with the patient.  Transported out via wheelchair.   Terrace Chiem M, RN 

## 2016-12-09 NOTE — Progress Notes (Signed)
  Echocardiogram 2D Echocardiogram has been performed.  Pieter PartridgeBrooke S Charisse Wendell 12/09/2016, 10:12 AM

## 2016-12-09 NOTE — ED Notes (Signed)
Attempt to  call report , nurse not available 

## 2016-12-09 NOTE — Progress Notes (Signed)
PT Cancellation Note  Patient Details Name: Roberto SergeantLarry [name] MRN: [id number] DOB: [birth year]   Cancelled Treatment:    Reason Eval/Treat Not Completed: Patient not medically ready. Pt currently with bedrest orders. PT will continue to follow and await updated activity orders.   Alessandra BevelsJennifer M Woodie Trusty 12/09/2016, 8:05 AM

## 2016-12-09 NOTE — Care Management Obs Status (Signed)
MEDICARE OBSERVATION STATUS NOTIFICATION   Patient Details  Name: Roberto [name] MRN: [id number] Date of Birth: 1950-10-14   Medicare Observation Status Notification Given:  Yes    Kermit BaloKelli F Scotlyn Mccranie, RN 12/09/2016, 2:57 PM

## 2016-12-09 NOTE — Consult Note (Signed)
Referring Physician: Dr. Hal Hope    Chief Complaint: Transient left-sided numbness  HPI: Roberto [name] is an 66 y.o. male who presented to the ED on Wednesday evening for assessment of sudden onset of left arm and leg sensory numbness with loss of grip strength. Also had a sensation of heaviness of his left arm and leg. Symptoms began at approximately 7:45 PM. He sat down and after several minutes the symptoms subsided. The numbness lasted for about 3 minutes and the hand weakness for about 7 minutes. The symptoms were preceded by spinning vertigo, lightheadedness and blurry vision, which occurred after walking about 50 feet while at work. He had a severe throbbing bifrontal 8/10 headache as well, which began to improve in parallel with improvement of his neurological deficits but was still present with 3/10 pain at the time of his initial ED evaluation. The patient denies symptoms of visual field cut, confusion or speech deficit.    His PMHx includes HTN, anemia and glaucoma. He has no prior history of stroke.   Past Medical History:  Diagnosis Date  . Anemia   . Glaucoma   . Hypertension     Past Surgical History:  Procedure Laterality Date  . HERNIA REPAIR      Family History  Problem Relation Age of Onset  . Heart disease Mother   . Stroke Father    Social History:  reports that he has never smoked. He has never used smokeless tobacco. He reports that he drinks about 0.6 oz of alcohol per week . He reports that he does not use drugs.  Allergies: No Known Allergies  Medications:  Prior to Admission:  Prescriptions Prior to Admission  Medication Sig Dispense Refill Last Dose  . colchicine 0.6 MG tablet Take 2 tablets now, then 1 more tablet 1 hour later. Wait 3 days before repeating this dosing again. 30 tablet 6   . naproxen sodium (ANAPROX DS) 550 MG tablet Take 1 tablet (550 mg total) by mouth 2 (two) times daily with a meal. 30 tablet 1   . predniSONE (DELTASONE) 20 MG  tablet Take 2 tablets daily with breakfast. 10 tablet 0   . Zoster Vaccine Live, PF, (ZOSTAVAX) 19417 UNT/0.65ML injection Inject 19,400 Units into the skin once. 1 each 0    Scheduled: . aspirin  300 mg Rectal Daily   Or  . aspirin  325 mg Oral Daily  . enoxaparin (LOVENOX) injection  40 mg Subcutaneous Q24H   Continuous: . sodium chloride    . sodium chloride 75 mL/hr at 12/09/16 0331   EYC:[id number] **OR** acetaminophen (TYLENOL) oral liquid 160 mg/5 mL **OR** acetaminophen, hydrALAZINE, ondansetron, senna-docusate, zolpidem  ROS: No N/V, SOB or CP.   Physical Examination: Blood pressure (!) 173/94, pulse 63, temperature 98.6 F (37 C), temperature source Oral, resp. rate 20, height 5' 6" (1.676 m), weight 87.2 kg (192 lb 3.2 oz), SpO2 99 %.  HEENT: [place]/AT Lungs: Respirations unlabored Ext: No edema  Neurologic Examination: Mental Status:  Alert, fully oriented, thought content appropriate. Speech fluent with intact repetition, naming and comprehension.  Able to follow all commands without difficulty. Cranial Nerves: II:  Visual fields intact. PERRL. III,IV, VI: Ptosis not present. EOMI with smooth visual pursuits and no nystagmus.  V,VII: Smile symmetric. Facial temp sensation normal bilaterally VIII: hearing intact to conversation IX,X: No hypophonia.  XI: Symmetric XII: midline tongue extension  Motor: RUE: 4/5 deltoid, otherwise 5/5 LUE: 5/5 proximal and distal RLE and LLE: 5/5 proximal and distal  Normal tone throughout; no atrophy noted Sensory: Decreased temp sensation to left leg; temp sensation otherwise normal. FT intact x 4 without extinction.   Deep Tendon Reflexes:  2+ biceps, brachioradialis, patellae and achilles bilaterally.  Plantars: Equivocal bilaterally Cerebellar: No ataxia with FNF bilaterally Gait: Deferred   Results for orders placed or performed during the hospital encounter of 12/08/16 (from the past 48 hour(s))  CBC with Differential      Status: None   Collection Time: 12/08/16  8:49 PM  Result Value Ref Range   WBC 6.0 4.0 - 10.5 K/uL   RBC 5.15 4.22 - 5.81 MIL/uL   Hemoglobin 16.3 13.0 - 17.0 g/dL   HCT 47.1 39.0 - 52.0 %   MCV 91.5 78.0 - 100.0 fL   MCH 31.7 26.0 - 34.0 pg   MCHC 34.6 30.0 - 36.0 g/dL   RDW 13.0 11.5 - 15.5 %   Platelets 196 150 - 400 K/uL   Neutrophils Relative % 42 %   Neutro Abs 2.5 1.7 - 7.7 K/uL   Lymphocytes Relative 43 %   Lymphs Abs 2.6 0.7 - 4.0 K/uL   Monocytes Relative 12 %   Monocytes Absolute 0.7 0.1 - 1.0 K/uL   Eosinophils Relative 2 %   Eosinophils Absolute 0.1 0.0 - 0.7 K/uL   Basophils Relative 1 %   Basophils Absolute 0.0 0.0 - 0.1 K/uL  Comprehensive metabolic panel     Status: Abnormal   Collection Time: 12/08/16  8:49 PM  Result Value Ref Range   Sodium 136 135 - 145 mmol/L   Potassium 4.0 3.5 - 5.1 mmol/L   Chloride 100 (L) 101 - 111 mmol/L   CO2 26 22 - 32 mmol/L   Glucose, Bld 98 65 - 99 mg/dL   BUN 12 6 - 20 mg/dL   Creatinine, Ser 0.93 0.61 - 1.24 mg/dL   Calcium 9.2 8.9 - 10.3 mg/dL   Total Protein 7.6 6.5 - 8.1 g/dL   Albumin 3.9 3.5 - 5.0 g/dL   AST 44 (H) 15 - 41 U/L   ALT 36 17 - 63 U/L   Alkaline Phosphatase 64 38 - 126 U/L   Total Bilirubin 0.6 0.3 - 1.2 mg/dL   GFR calc non Af Amer >60 >60 mL/min   GFR calc Af Amer >60 >60 mL/min    Comment: (NOTE) The eGFR has been calculated using the CKD EPI equation. This calculation has not been validated in all clinical situations. eGFR's persistently <60 mL/min signify possible Chronic Kidney Disease.    Anion gap 10 5 - 15  Troponin I     Status: None   Collection Time: 12/08/16  8:49 PM  Result Value Ref Range   Troponin I <0.03 <0.03 ng/mL   Dg Chest 2 View  Result Date: 12/08/2016 CLINICAL DATA:  Dizziness EXAM: CHEST  2 VIEW COMPARISON:  None. FINDINGS: Cardiac shadow is within normal limits. Aortic calcifications are seen. The lungs are clear bilaterally. No acute bony abnormality is noted.  IMPRESSION: No acute abnormality noted. Aortic Atherosclerosis (ICD10-170.0) Electronically Signed   By: Inez Catalina M.D.   On: 12/08/2016 21:56   Ct Head Wo Contrast  Result Date: 12/08/2016 CLINICAL DATA:  Dizziness with left upper and lower extremity numbness, frontal headache EXAM: CT HEAD WITHOUT CONTRAST TECHNIQUE: Contiguous axial images were obtained from the base of the skull through the vertex without intravenous contrast. COMPARISON:  None. FINDINGS: Brain: No evidence of acute infarction, hemorrhage, hydrocephalus, extra-axial collection or  mass lesion/mass effect. Vascular: No hyperdense vessels. Scattered calcifications at the carotid siphons. Skull: Normal. Negative for fracture or focal lesion. Sinuses/Orbits: No acute finding. Other: None IMPRESSION: No definite CT evidence for acute intracranial abnormality. Electronically Signed   By: Donavan Foil M.D.   On: 12/08/2016 22:08    Assessment: 66 y.o. male with transient left arm and leg sensory numbness with loss of grip strength 1. CT head shows no acute abnormality 2. EKG without atrial fibrillation or flutter 3. Stroke Risk Factors - HTN  Plan: 1. HgbA1c, fasting lipid panel 2. MRI, MRA of the brain without contrast 3. PT consult, OT consult, Speech consult 4. Echocardiogram 5. Carotid dopplers 6. Continue ASA 7. Start Lipitor 40 mg po qd. Obtain baseline CK level 8. Telemetry monitoring 9. Frequent neuro checks 10. Permissive HTN x 24 hours  _0  signed: Dr. Kerney Elbe  12/09/2016, 1:22 AM

## 2016-12-09 NOTE — Evaluation (Signed)
Physical Therapy Evaluation Patient Details Name: Roberto [name] MRN: [id number] DOB: 1951/05/05 Today's Date: 12/09/2016   History of Present Illness  Pt is a 66 y/o male admitted secondary to L sided numbness. All imaging has been negative. PMH including but not limited to HTN.  Clinical Impression  Pt presented supine in bed with HOB elevated, awake and willing to participate in therapy session. Prior to admission, pt reported that he was independent with all functional mobility and ADLs. Pt ambulated in hallway with supervision with no instability or LOB. Pt also successfully completed stair training with no concerns. No further acute PT needs identified at this time. PT signing off.    Follow Up Recommendations No PT follow up    Equipment Recommendations  None recommended by PT    Recommendations for Other Services       Precautions / Restrictions Precautions Precautions: None Restrictions Weight Bearing Restrictions: No      Mobility  Bed Mobility Overal bed mobility: Independent                Transfers Overall transfer level: Independent                  Ambulation/Gait Ambulation/Gait assistance: Supervision Ambulation Distance (Feet): 300 Feet Assistive device: None Gait Pattern/deviations: Step-through pattern Gait velocity: WFL Gait velocity interpretation: at or above normal speed for age/gender General Gait Details: no instability or LOB  Stairs Stairs: Yes Stairs assistance: Modified independent (Device/Increase time) Stair Management: Two rails;Alternating pattern;Forwards Number of Stairs: 2    Wheelchair Mobility    Modified Rankin (Stroke Patients Only)       Balance Overall balance assessment: No apparent balance deficits (not formally assessed)                                           Pertinent Vitals/Pain Pain Assessment: No/denies pain    Home Living Family/patient expects to be discharged to::  Private residence Living Arrangements: Spouse/significant other Available Help at Discharge: Family;Available 24 hours/day Type of Home: House Home Access: Stairs to enter Entrance Stairs-Rails: Doctor, general practiceight;Left Entrance Stairs-Number of Steps: 3 Home Layout: One level Home Equipment: None      Prior Function Level of Independence: Independent         Comments: 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     Hand Dominance   Dominant Hand: Right    Extremity/Trunk Assessment   Upper Extremity Assessment Upper Extremity Assessment: Defer to OT evaluation    Lower Extremity Assessment Lower Extremity Assessment: Overall WFL for tasks assessed       Communication   Communication: No difficulties  Cognition Arousal/Alertness: Awake/alert Behavior During Therapy: WFL for tasks assessed/performed Overall Cognitive Status: Within Functional Limits for tasks assessed                                        General Comments      Exercises     Assessment/Plan    PT Assessment Patent does not need any further PT services  PT Problem List         PT Treatment Interventions      PT Goals (Current goals can be found in the Care Plan section)  Acute Rehab PT Goals Patient Stated Goal: return home    Frequency  Barriers to discharge        Co-evaluation               AM-PAC PT "6 Clicks" Daily Activity  Outcome Measure Difficulty turning over in bed (including adjusting bedclothes, sheets and blankets)?: None Difficulty moving from lying on back to sitting on the side of the bed? : None Difficulty sitting down on and standing up from a chair with arms (e.g., wheelchair, bedside commode, etc,.)?: None Help needed moving to and from a bed to chair (including a wheelchair)?: None Help needed walking in hospital room?: None Help needed climbing 3-5 steps with a railing? : None 6 Click Score: 24    End of Session   Activity Tolerance: Patient  tolerated treatment well Patient left: in bed;with call bell/phone within reach;with family/visitor present Nurse Communication: Mobility status PT Visit Diagnosis: Other symptoms and signs involving the nervous system (R29.898)    Time: [id number] PT Time Calculation (min) (ACUTE ONLY): 10 min   Charges:   PT Evaluation $PT Eval Low Complexity: 1 Procedure     PT G Codes:   PT G-Codes **NOT FOR INPATIENT CLASS** Functional Assessment Tool Used: AM-PAC 6 Clicks Basic Mobility;Clinical judgement Functional Limitation: Mobility: Walking and moving around Mobility: Walking and Moving Around Current Status (Y7829(G8978): 0 percent impaired, limited or restricted Mobility: Walking and Moving Around Goal Status (F6213(G8979): 0 percent impaired, limited or restricted Mobility: Walking and Moving Around Discharge Status (Y8657(G8980): 0 percent impaired, limited or restricted    Roberto [name], PT, DPT [phone]   Roberto [name] 12/09/2016, 1:49 PM

## 2016-12-09 NOTE — Progress Notes (Signed)
STROKE TEAM PROGRESS NOTE   HISTORY OF PRESENT ILLNESS (per record) Roberto [name] is an 66 y.o. male  with a history of HTN, anemia and glaucoma, who presented with sudden onset of left arm and leg sensory numbness with loss of grip strength. Also had a sensation of heaviness of his left arm and leg. Symptoms began at approximately 7:45 PM. He sat down and after several minutes the symptoms subsided. The numbness lasted for about 3 minutes and the hand weakness for about 7 minutes. The symptoms were preceded by spinning vertigo, lightheadedness and blurry vision, which occurred after walking about 50 feet while at work. He had a severe throbbing bifrontal 8/10 headache as well, which began to improve in parallel with improvement of his neurological deficits but was still present with 3/10 pain at the time of his initial ED evaluation. The patient denies symptoms of visual field cut, confusion or speech deficit.    Patient was not administered IV t-PA secondary to minimal deficits on presentation. He was admitted to General Neurology for further evaluation and treatment.   SUBJECTIVE (INTERVAL HISTORY) His girlfriend and granddaughter are at bedside.  The patient is awake, alert, and follows all commands appropriately.  The patient recently lost five pounds after following dietary recommendations of his PCP.  Pt endorses "a couple" of alcoholic drinks each night, but could be under-reporting based on non-verbal cues from family in room.   OBJECTIVE Temp:  [97.7 F (36.5 C)-98.7 F (37.1 C)] 98.2 F (36.8 C) (07/26 0900) Pulse Rate:  [63-89] 72 (07/26 0900) Cardiac Rhythm: Normal sinus rhythm (07/26 0111) Resp:  [13-23] 18 (07/26 0900) BP: (130-188)/(86-120) 143/90 (07/26 0900) SpO2:  [95 %-100 %] 95 % (07/26 0900) Weight:  [83.9 kg (185 lb)-87.2 kg (192 lb 3.2 oz)] 87.2 kg (192 lb 3.2 oz) (07/26 0104)  CBC:  Recent Labs Lab 12/08/16 2049  WBC 6.0  NEUTROABS 2.5  HGB 16.3  HCT 47.1  MCV  91.5  PLT 196    Basic Metabolic Panel:  Recent Labs Lab 12/08/16 2049  NA 136  K 4.0  CL 100*  CO2 26  GLUCOSE 98  BUN 12  CREATININE 0.93  CALCIUM 9.2    Lipid Panel:    Component Value Date/Time   CHOL 151 12/09/2016 0425   TRIG 135 12/09/2016 0425   HDL 43 12/09/2016 0425   CHOLHDL 3.5 12/09/2016 0425   VLDL 27 12/09/2016 0425   LDLCALC 81 12/09/2016 0425   HgbA1c: No results found for: HGBA1C Urine Drug Screen:    Component Value Date/Time   LABOPIA NONE DETECTED 12/09/2016 0900   COCAINSCRNUR NONE DETECTED 12/09/2016 0900   LABBENZ NONE DETECTED 12/09/2016 0900   AMPHETMU NONE DETECTED 12/09/2016 0900   THCU NONE DETECTED 12/09/2016 0900   LABBARB NONE DETECTED 12/09/2016 0900    Alcohol Level No results found for: [facility]  IMAGING  Dg Chest 2 View 12/08/2016 IMPRESSION: No acute abnormality noted. Aortic Atherosclerosis (ICD10-170.0)   Ct Head Wo Contrast 12/08/2016 IMPRESSION: No definite CT evidence for acute intracranial abnormality.  Mr Brain 73Wo Contrast Mr Maxine GlennMra Head/brain Wo Cm 12/09/2016 IMPRESSION: 1. No acute intracranial abnormality. 2. Minimal chronic small vessel ischemic disease. 3. Severe proximal left P2 PCA stenosis.   2D Echo 12/09/2016  pending  Carotid US 12/09/2016 Findings suggest 1-39% internal carotid artery stenosis bilaterally. Vertebral arteries are patent with antegrade flow.    PHYSICAL EXAM Pleasant middle-age Caucasian male currently not in distress. . Afebrile. Head is nontraumatic.  Neck is supple without bruit.    Cardiac exam no murmur or gallop. Lungs are clear to auscultation. Distal pulses are well felt.  Neurological Exam ;  Awake  Alert oriented x 3. Normal speech and language.eye movements full without nystagmus.fundi were not visualized. Vision acuity and fields appear normal. Hearing is normal. Palatal movements are normal. Face symmetric. Tongue midline. Normal strength, tone, reflexes and coordination. Normal  sensation. Gait deferred.  ASSESSMENT/PLAN [name] is a 66 y.o. male with history of HTN, anemia and glaucoma presenting with sudden onset of left arm and leg sensory numbness with loss of grip strength. He did not receive IV t-PA due to minimal symptoms on arrival.   TIA: no primary lesion identified, possibly secondary to severe P2 PCA stenosis  Resultant   No deficits  CT head: no stroke  MRI head: no stroke.  Severe proximal left P2 PCA stenosis.  MRA head Severe proximal left P2 PCA stenosis  Carotid Doppler: B ICA 1-39% stenosis, VAs antegrade  2D Echo  pending   LDL 81  HgbA1c pending  SCDs for VTE prophylaxis  Diet Heart Room service appropriate? Yes; Fluid consistency: Thin  No antithrombotic prior to admission, now on aspirin 325 mg daily  Patient counseled to be compliant with his antithrombotic medications  Ongoing aggressive stroke risk factor management  Therapy recommendations: pending  Disposition:  home  Hypertension  Stable  Permissive hypertension (OK if < 220/120) but gradually normalize in 5-7 days  Long-term BP goal normotensive  Hyperlipidemia  Home meds:  none  LDL 81, goal < 70  Add atorvastatin 40mg  PO daily  Continue statin at discharge  Diabetes  HgbA1c  pending, goal < 7.0  Controlled  Other Stroke Risk Factors  ETOH use, advised to drink no more than 2 drink(s) a day  Obesity, Body mass index is 31.02 kg/m., recommend weight loss, diet and exercise as appropriate   Other Active Problems  None  Hospital day # 0  I have personally examined this patient, reviewed notes, independently viewed imaging studies, participated in medical decision making and plan of care.ROS completed by me personally and pertinent positives fully documented  I have made any additions or clarifications directly to the above note.  He presented with transient left arm and leg numbness and weakness likely due to right brain TIA etiology  probably small vessel disease. Continue ongoing stroke workup. Start aspirin for stroke prevention and aggressive risk factor modification. Greater than 50% time during this 35 minute visit was spent on counseling and coordination of care about stroke and TIA prevention and treatment discussion and answering questions. The patient may be interested in participating in the previous stroke/TIA prevention study and he will be given information to review at home.  Delia HeadyPramod Sethi, MD Medical Director [facility] Pager: [phone] 12/09/2016 2:57 PM   To contact Stroke Continuity provider, please refer to [email or web address]. After hours, contact General Neurology

## 2016-12-09 NOTE — Progress Notes (Signed)
Pt off the floor at testing during his 11 am vitals and neuro check.  Will continue to monitor.  Sondra ComeSilva, [place] Mccutchen M, RN

## 2016-12-09 NOTE — Progress Notes (Signed)
OT Cancellation Note  Patient Details Name: Roberto [name] MRN: [id number] DOB: Oct 31, 1950   Cancelled Treatment:    Reason Eval/Treat Not Completed: Patient not medically ready. Pt currently with bedrest orders. OT will continue to follow and await updated activity orders.  Evern BioLaura J Mehar Sagen 12/09/2016, 8:06 AM

## 2016-12-09 NOTE — Progress Notes (Signed)
PT Cancellation Note  Patient Details Name: Roberto SergeantLarry Lobdell MRN: [id number] DOB: January 14, 1951   Cancelled Treatment:    Reason Eval/Treat Not Completed: Patient at procedure or test/unavailable. Pt at MRI. PT will continue to f/u with pt for evaluation.    Alessandra BevelsJennifer M Oluwatobiloba Martin 12/09/2016, 12:19 PM

## 2016-12-10 LAB — HEMOGLOBIN A1C
HEMOGLOBIN A1C: 5.8 % — AB (ref 4.8–5.6)
Mean Plasma Glucose: 120 mg/dL

## 2016-12-14 LAB — ECHOCARDIOGRAM COMPLETE
Height: 66 in
LCCAPSYS: 195 cm/s
LEFT ECA DIAS: -9 cm/s
LEFT VERTEBRAL DIAS: 13 cm/s
LICADSYS: -60 cm/s
LICAPDIAS: -15 cm/s
Left CCA dist dias: -8 cm/s
Left CCA dist sys: -49 cm/s
Left CCA prox dias: 21 cm/s
Left ICA dist dias: -22 cm/s
Left ICA prox sys: -46 cm/s
RCCADSYS: 41 cm/s
RIGHT ECA DIAS: -8 cm/s
RIGHT VERTEBRAL DIAS: 12 cm/s
Right CCA prox dias: 12 cm/s
Right CCA prox sys: 67 cm/s
WEIGHTICAEL: 3075.2 [oz_av]

## 2017-01-26 ENCOUNTER — Ambulatory Visit: Payer: No Typology Code available for payment source | Admitting: Neurology

## 2017-03-09 ENCOUNTER — Ambulatory Visit: Payer: No Typology Code available for payment source | Admitting: Neurology

## 2018-01-05 ENCOUNTER — Encounter: Payer: Self-pay | Admitting: Physician Assistant

## 2018-07-16 DEATH — deceased

## 2019-08-20 ENCOUNTER — Ambulatory Visit: Payer: PRIVATE HEALTH INSURANCE | Attending: Internal Medicine

## 2019-08-20 DIAGNOSIS — Z23 Encounter for immunization: Secondary | ICD-10-CM

## 2019-08-20 NOTE — Progress Notes (Signed)
   Covid-19 Vaccination Clinic  Name:  Roberto [name]    MRN: [id number] DOB: 11-Nov-1950  08/20/2019  [name] was observed post Covid-19 immunization for 15 minutes without incident. He was provided with Vaccine Information Sheet and instruction to access the V-Safe system.   [name] was instructed to call 911 with any severe reactions post vaccine: Marland Kitchen Difficulty breathing  . Swelling of face and throat  . A fast heartbeat  . A bad rash all over body  . Dizziness and weakness   Immunizations Administered    Name Date Dose VIS Date Route   Pfizer COVID-19 Vaccine 08/20/2019 12:20 PM 0.3 mL 04/27/2019 Intramuscular   Manufacturer: [employer], [employer]   Lot: [id number]   NDC: [address]-4

## 2019-09-11 ENCOUNTER — Ambulatory Visit: Payer: PRIVATE HEALTH INSURANCE | Attending: Internal Medicine

## 2019-09-11 DIAGNOSIS — Z23 Encounter for immunization: Secondary | ICD-10-CM

## 2019-09-11 NOTE — Progress Notes (Signed)
   Covid-19 Vaccination Clinic  Name:  Roberto [name]    MRN: [id number] DOB: 08-06-1950  09/11/2019  Mr. Astorga was observed post Covid-19 immunization for 15 minutes without incident. He was provided with Vaccine Information Sheet and instruction to access the V-Safe system.   Mr. Faucett was instructed to call 911 with any severe reactions post vaccine: Marland Kitchen Difficulty breathing  . Swelling of face and throat  . A fast heartbeat  . A bad rash all over body  . Dizziness and weakness   Immunizations Administered    Name Date Dose VIS Date Route   Pfizer COVID-19 Vaccine 09/11/2019 12:07 PM 0.3 mL 07/11/2018 Intramuscular   Manufacturer: [employer], [employer]   Lot: [id number]   NDC: [address]-2

## 2021-10-23 ENCOUNTER — Inpatient Hospital Stay (HOSPITAL_COMMUNITY)
Admission: EM | Admit: 2021-10-23 | Discharge: 2021-11-06 | DRG: 064 | Disposition: A | Discharge status: Skilled Nursing Facility | Payer: PRIVATE HEALTH INSURANCE | Attending: Internal Medicine | Admitting: Internal Medicine

## 2021-10-23 ENCOUNTER — Emergency Department (HOSPITAL_COMMUNITY): Payer: PRIVATE HEALTH INSURANCE

## 2021-10-23 ENCOUNTER — Observation Stay (HOSPITAL_COMMUNITY): Payer: PRIVATE HEALTH INSURANCE

## 2021-10-23 ENCOUNTER — Other Ambulatory Visit: Payer: Self-pay

## 2021-10-23 ENCOUNTER — Encounter (HOSPITAL_COMMUNITY): Payer: Self-pay

## 2021-10-23 DIAGNOSIS — H53462 Homonymous bilateral field defects, left side: Secondary | ICD-10-CM | POA: Diagnosis present

## 2021-10-23 DIAGNOSIS — R29703 NIHSS score 3: Secondary | ICD-10-CM | POA: Diagnosis present

## 2021-10-23 DIAGNOSIS — E871 Hypo-osmolality and hyponatremia: Secondary | ICD-10-CM

## 2021-10-23 DIAGNOSIS — I639 Cerebral infarction, unspecified: Principal | ICD-10-CM | POA: Diagnosis present

## 2021-10-23 DIAGNOSIS — R7303 Prediabetes: Secondary | ICD-10-CM | POA: Diagnosis present

## 2021-10-23 DIAGNOSIS — L237 Allergic contact dermatitis due to plants, except food: Secondary | ICD-10-CM | POA: Diagnosis present

## 2021-10-23 DIAGNOSIS — Z823 Family history of stroke: Secondary | ICD-10-CM

## 2021-10-23 DIAGNOSIS — R197 Diarrhea, unspecified: Secondary | ICD-10-CM | POA: Diagnosis not present

## 2021-10-23 DIAGNOSIS — H409 Unspecified glaucoma: Secondary | ICD-10-CM | POA: Diagnosis present

## 2021-10-23 DIAGNOSIS — E785 Hyperlipidemia, unspecified: Secondary | ICD-10-CM | POA: Diagnosis present

## 2021-10-23 DIAGNOSIS — R471 Dysarthria and anarthria: Secondary | ICD-10-CM | POA: Diagnosis present

## 2021-10-23 DIAGNOSIS — R414 Neurologic neglect syndrome: Secondary | ICD-10-CM | POA: Diagnosis present

## 2021-10-23 DIAGNOSIS — F4322 Adjustment disorder with anxiety: Secondary | ICD-10-CM | POA: Diagnosis not present

## 2021-10-23 DIAGNOSIS — I493 Ventricular premature depolarization: Secondary | ICD-10-CM | POA: Diagnosis not present

## 2021-10-23 DIAGNOSIS — I6522 Occlusion and stenosis of left carotid artery: Secondary | ICD-10-CM | POA: Diagnosis present

## 2021-10-23 DIAGNOSIS — D649 Anemia, unspecified: Secondary | ICD-10-CM | POA: Diagnosis present

## 2021-10-23 DIAGNOSIS — I10 Essential (primary) hypertension: Secondary | ICD-10-CM | POA: Diagnosis present

## 2021-10-23 DIAGNOSIS — Z833 Family history of diabetes mellitus: Secondary | ICD-10-CM

## 2021-10-23 DIAGNOSIS — I6381 Other cerebral infarction due to occlusion or stenosis of small artery: Secondary | ICD-10-CM | POA: Diagnosis not present

## 2021-10-23 DIAGNOSIS — Z79899 Other long term (current) drug therapy: Secondary | ICD-10-CM

## 2021-10-23 DIAGNOSIS — E872 Acidosis, unspecified: Secondary | ICD-10-CM | POA: Diagnosis not present

## 2021-10-23 DIAGNOSIS — H547 Unspecified visual loss: Secondary | ICD-10-CM | POA: Diagnosis present

## 2021-10-23 DIAGNOSIS — M109 Gout, unspecified: Secondary | ICD-10-CM | POA: Diagnosis present

## 2021-10-23 DIAGNOSIS — J690 Pneumonitis due to inhalation of food and vomit: Secondary | ICD-10-CM

## 2021-10-23 DIAGNOSIS — A4102 Sepsis due to Methicillin resistant Staphylococcus aureus: Secondary | ICD-10-CM | POA: Diagnosis not present

## 2021-10-23 DIAGNOSIS — I63431 Cerebral infarction due to embolism of right posterior cerebral artery: Principal | ICD-10-CM | POA: Diagnosis present

## 2021-10-23 DIAGNOSIS — Z8249 Family history of ischemic heart disease and other diseases of the circulatory system: Secondary | ICD-10-CM

## 2021-10-23 DIAGNOSIS — Z8673 Personal history of transient ischemic attack (TIA), and cerebral infarction without residual deficits: Secondary | ICD-10-CM

## 2021-10-23 DIAGNOSIS — R000 Tachycardia, unspecified: Secondary | ICD-10-CM | POA: Diagnosis not present

## 2021-10-23 DIAGNOSIS — G8194 Hemiplegia, unspecified affecting left nondominant side: Secondary | ICD-10-CM | POA: Diagnosis present

## 2021-10-23 DIAGNOSIS — R6521 Severe sepsis with septic shock: Secondary | ICD-10-CM | POA: Diagnosis not present

## 2021-10-23 DIAGNOSIS — F430 Acute stress reaction: Secondary | ICD-10-CM | POA: Diagnosis not present

## 2021-10-23 DIAGNOSIS — R0902 Hypoxemia: Secondary | ICD-10-CM | POA: Diagnosis not present

## 2021-10-23 DIAGNOSIS — M545 Low back pain, unspecified: Secondary | ICD-10-CM | POA: Diagnosis not present

## 2021-10-23 DIAGNOSIS — Z7982 Long term (current) use of aspirin: Secondary | ICD-10-CM

## 2021-10-23 DIAGNOSIS — B950 Streptococcus, group A, as the cause of diseases classified elsewhere: Secondary | ICD-10-CM | POA: Diagnosis present

## 2021-10-23 DIAGNOSIS — K029 Dental caries, unspecified: Secondary | ICD-10-CM | POA: Diagnosis present

## 2021-10-23 DIAGNOSIS — Z006 Encounter for examination for normal comparison and control in clinical research program: Secondary | ICD-10-CM

## 2021-10-23 DIAGNOSIS — W19XXXA Unspecified fall, initial encounter: Secondary | ICD-10-CM | POA: Diagnosis present

## 2021-10-23 DIAGNOSIS — E222 Syndrome of inappropriate secretion of antidiuretic hormone: Secondary | ICD-10-CM | POA: Diagnosis present

## 2021-10-23 DIAGNOSIS — R29710 NIHSS score 10: Secondary | ICD-10-CM | POA: Diagnosis not present

## 2021-10-23 DIAGNOSIS — R29810 Facial weakness: Secondary | ICD-10-CM | POA: Diagnosis present

## 2021-10-23 DIAGNOSIS — R7881 Bacteremia: Secondary | ICD-10-CM

## 2021-10-23 DIAGNOSIS — L719 Rosacea, unspecified: Secondary | ICD-10-CM | POA: Diagnosis present

## 2021-10-23 DIAGNOSIS — R0989 Other specified symptoms and signs involving the circulatory and respiratory systems: Secondary | ICD-10-CM | POA: Diagnosis present

## 2021-10-23 DIAGNOSIS — E781 Pure hyperglyceridemia: Secondary | ICD-10-CM | POA: Diagnosis present

## 2021-10-23 DIAGNOSIS — R109 Unspecified abdominal pain: Secondary | ICD-10-CM | POA: Diagnosis not present

## 2021-10-23 DIAGNOSIS — D751 Secondary polycythemia: Secondary | ICD-10-CM

## 2021-10-23 DIAGNOSIS — D72829 Elevated white blood cell count, unspecified: Secondary | ICD-10-CM | POA: Diagnosis not present

## 2021-10-23 DIAGNOSIS — N179 Acute kidney failure, unspecified: Secondary | ICD-10-CM

## 2021-10-23 LAB — CBC
HCT: 51.1 % (ref 39.0–52.0)
Hemoglobin: 17.7 g/dL — ABNORMAL HIGH (ref 13.0–17.0)
MCH: 32 pg (ref 26.0–34.0)
MCHC: 34.6 g/dL (ref 30.0–36.0)
MCV: 92.4 fL (ref 80.0–100.0)
Platelets: 226 10*3/uL (ref 150–400)
RBC: 5.53 MIL/uL (ref 4.22–5.81)
RDW: 12.4 % (ref 11.5–15.5)
WBC: 9.2 10*3/uL (ref 4.0–10.5)
nRBC: 0 % (ref 0.0–0.2)

## 2021-10-23 LAB — DIFFERENTIAL
Abs Immature Granulocytes: 0.09 10*3/uL — ABNORMAL HIGH (ref 0.00–0.07)
Basophils Absolute: 0 10*3/uL (ref 0.0–0.1)
Basophils Relative: 0 %
Eosinophils Absolute: 0 10*3/uL (ref 0.0–0.5)
Eosinophils Relative: 0 %
Immature Granulocytes: 1 %
Lymphocytes Relative: 22 %
Lymphs Abs: 2 10*3/uL (ref 0.7–4.0)
Monocytes Absolute: 1.2 10*3/uL — ABNORMAL HIGH (ref 0.1–1.0)
Monocytes Relative: 13 %
Neutro Abs: 5.9 10*3/uL (ref 1.7–7.7)
Neutrophils Relative %: 64 %

## 2021-10-23 LAB — COMPREHENSIVE METABOLIC PANEL
ALT: 35 U/L (ref 0–44)
AST: 32 U/L (ref 15–41)
Albumin: 3.3 g/dL — ABNORMAL LOW (ref 3.5–5.0)
Alkaline Phosphatase: 56 U/L (ref 38–126)
Anion gap: 11 (ref 5–15)
BUN: 12 mg/dL (ref 8–23)
CO2: 22 mmol/L (ref 22–32)
Calcium: 8.8 mg/dL — ABNORMAL LOW (ref 8.9–10.3)
Chloride: 100 mmol/L (ref 98–111)
Creatinine, Ser: 0.92 mg/dL (ref 0.61–1.24)
GFR, Estimated: >60 mL/min (ref >60)
Glucose, Bld: 101 mg/dL — ABNORMAL HIGH (ref 70–99)
Potassium: 4.1 mmol/L (ref 3.5–5.1)
Sodium: 133 mmol/L — ABNORMAL LOW (ref 135–145)
Total Bilirubin: 1.1 mg/dL (ref 0.3–1.2)
Total Protein: 6.7 g/dL (ref 6.5–8.1)

## 2021-10-23 LAB — I-STAT CHEM 8, ED
BUN: 13 mg/dL (ref 8–23)
Calcium, Ion: 0.98 mmol/L — ABNORMAL LOW (ref 1.15–1.40)
Chloride: 101 mmol/L (ref 98–111)
Creatinine, Ser: 0.9 mg/dL (ref 0.61–1.24)
Glucose, Bld: 104 mg/dL — ABNORMAL HIGH (ref 70–99)
HCT: 52 % (ref 39.0–52.0)
Hemoglobin: 17.7 g/dL — ABNORMAL HIGH (ref 13.0–17.0)
Potassium: 3.9 mmol/L (ref 3.5–5.1)
Sodium: 132 mmol/L — ABNORMAL LOW (ref 135–145)
TCO2: 21 mmol/L — ABNORMAL LOW (ref 22–32)

## 2021-10-23 LAB — HEMOGLOBIN A1C
Hgb A1c MFr Bld: 5.8 % — ABNORMAL HIGH (ref 4.8–5.6)
Mean Plasma Glucose: 119.76 mg/dL

## 2021-10-23 LAB — LIPID PANEL
Cholesterol: 173 mg/dL (ref 0–200)
HDL: 67 mg/dL (ref >40)
LDL Cholesterol: 52 mg/dL (ref 0–99)
Total CHOL/HDL Ratio: 2.6 RATIO
Triglycerides: 272 mg/dL — ABNORMAL HIGH (ref <150)
VLDL: 54 mg/dL — ABNORMAL HIGH (ref 0–40)

## 2021-10-23 LAB — APTT: aPTT: 26 seconds (ref 24–36)

## 2021-10-23 LAB — CBG MONITORING, ED: Glucose-Capillary: 105 mg/dL — ABNORMAL HIGH (ref 70–99)

## 2021-10-23 LAB — ETHANOL: Alcohol, Ethyl (B): <10 mg/dL (ref <10)

## 2021-10-23 LAB — PROTIME-INR
INR: 1 (ref 0.8–1.2)
Prothrombin Time: 13.4 seconds (ref 11.4–15.2)

## 2021-10-23 MED ORDER — ASPIRIN 81 MG PO CHEW
81.0000 mg | CHEWABLE_TABLET | Freq: Every day | ORAL | Status: DC
Start: 2021-10-23 — End: 2021-11-07
  Administered 2021-10-23 – 2021-11-06 (×15): 81 mg via ORAL
  Filled 2021-10-23 (×15): qty 1

## 2021-10-23 MED ORDER — IOHEXOL 350 MG/ML SOLN
100.0000 mL | Freq: Once | INTRAVENOUS | Status: AC | PRN
  Administered 2021-10-23: 100 mL via INTRAVENOUS

## 2021-10-23 MED ORDER — ACETAMINOPHEN 325 MG PO TABS
650.0000 mg | ORAL_TABLET | Freq: Once | ORAL | Status: AC
  Administered 2021-10-23: 650 mg via ORAL
  Filled 2021-10-23: qty 2

## 2021-10-23 MED ORDER — CLOPIDOGREL BISULFATE 75 MG PO TABS
75.0000 mg | ORAL_TABLET | Freq: Every day | ORAL | Status: DC
  Administered 2021-10-23 – 2021-11-06 (×15): 75 mg via ORAL
  Filled 2021-10-23 (×15): qty 1

## 2021-10-23 MED ORDER — SODIUM CHLORIDE 0.9% FLUSH
3.0000 mL | Freq: Once | INTRAVENOUS | Status: AC
  Administered 2021-10-23: 3 mL via INTRAVENOUS

## 2021-10-23 MED ORDER — MELATONIN 3 MG PO TABS
3.0000 mg | ORAL_TABLET | Freq: Every day | ORAL | Status: DC
  Administered 2021-10-23 – 2021-11-05 (×14): 3 mg via ORAL
  Filled 2021-10-23 (×15): qty 1

## 2021-10-23 MED ORDER — ACETAMINOPHEN 650 MG RE SUPP: 650.0000 mg | Freq: Four times a day (QID) | RECTAL | Status: DC | PRN

## 2021-10-23 MED ORDER — ACETAMINOPHEN 325 MG PO TABS
650.0000 mg | ORAL_TABLET | Freq: Four times a day (QID) | ORAL | Status: DC | PRN
  Administered 2021-10-23: 650 mg via ORAL
  Filled 2021-10-23 (×2): qty 2

## 2021-10-23 MED ORDER — POLYETHYLENE GLYCOL 3350 17 G PO PACK: 17.0000 g | PACK | Freq: Every day | ORAL | Status: DC | PRN

## 2021-10-23 MED ORDER — CALAMINE EX LOTN
1.0000 "application " | TOPICAL_LOTION | Freq: Three times a day (TID) | CUTANEOUS | Status: DC | PRN
  Administered 2021-10-24 – 2021-10-26 (×2): 1 via TOPICAL
  Filled 2021-10-23: qty 177

## 2021-10-23 NOTE — Progress Notes (Signed)
Patient c/o itching from recent poison oak/ivy exposure. Patient also c/o mild-moderate headache due to MRI noise & back pain due to sitting in the bed for a length of time.  Gave patient tylenol, heating pack and pillow wedge, still complaining of back pain and now cannot sleep. No more headache.   Paged IMTS for moderate pain reliever, topical cream and a sleep aid for patient.

## 2021-10-23 NOTE — H&P (Cosign Needed Addendum)
Date: 10/23/2021               Patient Name:  Roberto [name] MRN: [id number]  DOB: 1951/03/05 Age / Sex: 71 y.o., male   PCP: Patient, No Pcp Per (Inactive)         Medical Service: Internal Medicine Teaching Service         Attending Physician: Dr. Sid Falcon, MD    First Contact: Dr. Farrel Gordon Pager: [phone]  Second Contact: Dr. Sanjuan Dame Pager: [phone]       After Hours (After 5p/  First Contact Pager: [phone]  weekends / holidays): Second Contact Pager: [phone]   Chief Complaint: L sided numbess  History of Present Illness: Roberto [name] is a 71 y.o. M with pertinent PMH of CVA who presents with L sided facial and extremity numbness for two days. He states that he began feeling like something was 'off' a few days ago, and that yesterday he fell when walking back into his house from the mail box. When he fell he realized he couldn't walked and leaned on his truck to help get back inside. At that time he went to bed and when he woke this morning had L sided numbness and some arm pain. This prompted him to seek further evaluation.  He states that currently he only feels numbness at the L corner of his lip and up to his nose. His arm has some decreased sensation compared to the R and his L leg feels almost static-like. He has numbness at the bottom of his L foot that is affecting his balance.  He reports that he had a CVA back in 2017 and at the time has very high BP. He was given BP medication for some time and eventually taken off of it. He was previously on other GDMT including ASA and statin but is no longer taking any medications.  He denies headache, vision changes, confusion or vision loss, weakness. He endorses numbness as described above and has nervousness/anxiety.  ED Course: Code stroke called. CT head with no acute changes, ASPECTS score 10. CTA head and neck limited but showed approximately 75% stenosis in the proximal left ICA and decreased perfusion in the  bilateral cerebral and cerebellar hemispheres, some of which may be in a watershed distribution; however, some or all be artifactual. CMP remarkable for Na 133. CBC remarkable for Hgb 17.7. PT/INR, APTT WNL. Ethanol <10. Neurology was consulted as well.   Meds:  Tylenol as needed  Allergies: Allergies as of 10/23/2021   (No Known Allergies)   Past Medical History:  Diagnosis Date   Anemia    Glaucoma    Hypertension     Family History:  Mom: diabetes, CAD (died in operation during ICD placement) Dad: CVA Four brothers, 3 are healthy and 1 died with dementia.  Social History:  Patient lives at home with his wife. He is a Land and works in Theatre manager. He occasionally drinks 2-3 beers when he gets off work from the nightshift, sometimes more on the weekend, but rarely drinks on week days due to work Nurse, mental health. 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 He denies tobacco or other substance use. Is independent in ADLs and iADLs. No PCP, goes to local urgent care for needs.   Review of Systems: A complete ROS was negative except as per HPI.   Physical Exam: Blood pressure (!) 185/119, pulse 82, temperature 98.8 F (37.1 C),  resp. rate 19, weight 81.6 kg, SpO2 96 %. Constitutional:Elderly gentleman resting comfortably in bed, in no acute distress.  Cardio:Regular rate and rhythm. No murmurs, rubs, or gallops appreciated. Pulm:Clear to auscultation bilaterally. Abdomen:Soft, nontender, nondistended. [id number] for extremity edema. Clubbing of fingertips noted.  Skin:Warm and dry. Scabbed, healing poison oak rash over R forearm. Neuro: Mental Status: Patient is awake, alert, oriented x3 No signs of aphasia or neglect Cranial Nerves: II: Pupils equal, round, and reactive to light.   III,IV, VI: EOMI without ptosis or diploplia.  V: Facial sensation is decreased to light touch on the L. VII: Facial movement is symmetric.  VIII: Hearing is intact  to voice X: Uvula elevates symmetrically XI: Shoulder shrug is symmetric. XII: Tongue is midline without atrophy or fasciculations.  Motor: Normal effort thorughout, at least 5/5 bilateral UE, 5/5 bilateral LE Sensory: Sensation is grossly intact bilateral UE & LE Cerebellar: Finger-Nose and Heel-Shin intact bilaterally Psych:Pleasant mood and affect.   EKG: personally reviewed my interpretation is NSR with L axis deviation and possible L atrial enlargement.  CT head: 1. No acute intracranial process. 2. ASPECTS is 10  CTA head/neck: 1. Evaluation is limited by poor bolus timing and venous contamination. Within this limitation, there is approximately 75% stenosis in the proximal left ICA. No other hemodynamically significant stenosis in the neck. 2. Evaluation of the distal intracranial vasculature is severely limited, as described above. The proximal intracranial vasculature appears patent. 3. On CT perfusion, no infarct core is detected, but there is decreased perfusion in the bilateral cerebral and cerebellar hemispheres, some of which may be in a watershed distribution; however, some or all be artifactual.  Assessment & Plan by Problem: Principal Problem:   Suspected cerebrovascular accident (CVA)  Roberto [name] is a 71 y.o. M with pertinent PMH of CVA who presented with L sided facial and extremity numbness for two days admitted for CVA work-up.  CVA, suspected, ASPECTS 10 Patient has reported history of CVA in 2017 with reported deficit at that time of prolonged short term memory loss (8-9 months). He has not been on any medication in several years for risk modification. Imaging thus far has not revealed acute CVA however MRI has not yet been done. At this time his symptoms are limited to altered sensation of the L side of his body with otherwise normal neurological exam.  -Neurology following, appreciate their recommendations -F/u HbA1c, lipid panel -F/u echo -F/u MRI -PT, OT,  SLP consulted -Permissive HTN through tomorrow AM -Start Plavix 75 mg daily -Start ASA 81 mg daily x3w  Dispo: Admit patient to Observation with expected length of stay less than 2 midnights.  Signed: Farrel Gordon, DO 10/23/2021, 3:53 PM  Pager: [phone] After 5pm on weekdays and 1pm on weekends: On Call pager: [phone]

## 2021-10-23 NOTE — Progress Notes (Signed)
Received pt on unit  

## 2021-10-23 NOTE — ED Provider Notes (Signed)
[facility]   CSN: [id number] Arrival date & time: 10/23/21  1235  An emergency department physician performed an initial assessment on this suspected stroke patient at [address] (Dr Particia NearingHaviland en route to CT).  History  Chief Complaint  Patient presents with   Code Stroke    Roberto [name] is a 71 y.o. male.  Pt is a 71 yo male with a pmhx significant for htn, glaucoma, hyperlipidemia, and anemia.  Pt was last seen normal last night.  He woke up with left sided weakness and numbness.  Sx started yesterday.       Home Medications Prior to Admission medications   Medication Sig Start Date End Date Taking? Authorizing Provider  amLODipine (NORVASC) 5 MG tablet Take 1 tablet (5 mg total) by mouth daily. 12/09/16 12/09/17  Dhungel, Theda BelfastNishant, MD  aspirin 325 MG tablet Take 1 tablet (325 mg total) by mouth daily. 12/10/16   Dhungel, Theda BelfastNishant, MD  atorvastatin (LIPITOR) 40 MG tablet Take 1 tablet (40 mg total) by mouth daily at 6 PM. 12/09/16   Dhungel, Theda BelfastNishant, MD      Allergies    Patient has no known allergies.    Review of Systems   Review of Systems  Neurological:        Left sided numbness and weakness  All other systems reviewed and are negative.   Physical Exam Updated Vital Signs BP (!) 185/119   Pulse 82   Temp 98.8 F (37.1 C)   Resp 19   Wt 81.6 kg   SpO2 96%   BMI 29.04 kg/m  Physical Exam Vitals and nursing note reviewed.  HENT:     Head: Normocephalic and atraumatic.     Right Ear: External ear normal.     Left Ear: External ear normal.     Nose: Nose normal.     Mouth/Throat:     Mouth: Mucous membranes are dry.  Eyes:     Extraocular Movements: Extraocular movements intact.     Conjunctiva/sclera: Conjunctivae normal.     Pupils: Pupils are equal, round, and reactive to light.  Cardiovascular:     Rate and Rhythm: Normal rate and regular rhythm.     Pulses: Normal pulses.     Heart sounds: Normal heart  sounds.  Pulmonary:     Effort: Pulmonary effort is normal.     Breath sounds: Normal breath sounds.  Abdominal:     General: Abdomen is flat. Bowel sounds are normal.     Palpations: Abdomen is soft.  Musculoskeletal:        General: Normal range of motion.     Cervical back: Normal range of motion and neck supple.  Skin:    General: Skin is warm.     Capillary Refill: Capillary refill takes less than 2 seconds.  Neurological:     Mental Status: He is alert.     Comments: Left sided neglect; left leg numbness  Psychiatric:        Mood and Affect: Mood normal.        Behavior: Behavior normal.     ED Results / Procedures / Treatments   Labs (all labs ordered are listed, but only abnormal results are displayed) Labs Reviewed  CBC - Abnormal; Notable for the following components:      Result Value   Hemoglobin 17.7 (*)    All other components within normal limits  DIFFERENTIAL - Abnormal; Notable for the following components:  Monocytes Absolute 1.2 (*)    Abs Immature Granulocytes 0.09 (*)    All other components within normal limits  COMPREHENSIVE METABOLIC PANEL - Abnormal; Notable for the following components:   Sodium 133 (*)    Glucose, Bld 101 (*)    Calcium 8.8 (*)    Albumin 3.3 (*)    All other components within normal limits  CBG MONITORING, ED - Abnormal; Notable for the following components:   Glucose-Capillary 105 (*)    All other components within normal limits  I-STAT CHEM 8, ED - Abnormal; Notable for the following components:   Sodium 132 (*)    Glucose, Bld 104 (*)    Calcium, Ion 0.98 (*)    TCO2 21 (*)    Hemoglobin 17.7 (*)    All other components within normal limits  PROTIME-INR  APTT  ETHANOL  HEMOGLOBIN A1C  LIPID PANEL  CBG MONITORING, ED    EKG EKG Interpretation  Date/Time:  Friday October 23 2021 13:18:38 EDT Ventricular Rate:  81 PR Interval:  161 QRS Duration: 89 QT Interval:  394 QTC Calculation: 458 R Axis:   -31 Text  Interpretation: Sinus rhythm Probable left atrial enlargement Left axis deviation Consider anterior infarct No significant change since last tracing Confirmed by Jacalyn Lefevre [phone]) on 10/23/2021 1:32:29 PM  Radiology CT ANGIO HEAD NECK W WO CM W PERF (CODE STROKE)  Result Date: 10/23/2021 CLINICAL DATA:  Stroke suspected EXAM: CT ANGIOGRAPHY HEAD AND NECK CT PERFUSION BRAIN TECHNIQUE: Multidetector CT imaging of the head and neck was performed using the standard protocol during bolus administration of intravenous contrast. Multiplanar CT image reconstructions and MIPs were obtained to evaluate the vascular anatomy. Carotid stenosis measurements (when applicable) are obtained utilizing NASCET criteria, using the distal internal carotid diameter as the denominator. Multiphase CT imaging of the brain was performed following IV bolus contrast injection. Subsequent parametric perfusion maps were calculated using RAPID software. RADIATION DOSE REDUCTION: This exam was performed according to the departmental dose-optimization program which includes automated exposure control, adjustment of the mA and/or kV according to patient size and/or use of iterative reconstruction technique. CONTRAST:  OMNIPAQUE IOHEXOL 350 MG/ML SOLN COMPARISON:  No prior CTA, correlation is made with 12/09/2016 MRA head and 10/23/2021 CT head FINDINGS: CT HEAD FINDINGS For noncontrast findings, please see same day CT head. CTA NECK FINDINGS Evaluation is limited by bolus timing and significant contrast in the venous system. Aortic arch: Standard branching. Imaged portion shows no evidence of aneurysm or dissection. No significant stenosis of the major arch vessel origins. Aortic atherosclerosis Right carotid system: No evidence of dissection, occlusion, or hemodynamically significant stenosis (greater than 50%). Atherosclerotic disease at the bifurcation and in the proximal ICA is not hemodynamically significant. Left carotid system:  Approximally 75% luminal narrowing of the proximal left ICA secondary to primarily noncalcified plaque no evidence of dissection or occlusion. Vertebral arteries: No evidence of dissection, occlusion, or hemodynamically significant stenosis (greater than 50%). Skeleton: Degenerative changes in the cervical spine with significant disc height loss. Poor dentition with multifocal periapical lucency and dental caries. Other neck: No acute finding. Upper chest: No focal pulmonary opacity or pleural effusion in the imaged lungs. Review of the MIP images confirms the above findings CTA HEAD FINDINGS Evaluation is limited by bolus timing and degree of venous contamination. Anterior circulation: Both internal carotid arteries are patent to the termini, with mild narrowing in the bilateral cavernous carotids. A1 segments patent. Normal anterior communicating artery. Evaluation of  the more distal anterior cerebral arteries is limited by poor opacification but appears grossly patent to the distal aspects. No M1 stenosis or occlusion. Normal MCA bifurcations. Evaluation of the more distal MCAs is limited by poor opacification, but they appear grossly patent to their distal aspects. Posterior circulation: Vertebral arteries patent to the vertebrobasilar junction without stenosis. Basilar patent to its distal aspect. Superior cerebellar arteries are not well opacified. Patent P1 segments. PCAs perfused proximally but are poorly opacified past the anterior P2 segments and evaluation is significantly complicated by venous contamination. The bilateral posterior communicating arteries are not definitively visualized. Venous sinuses: As permitted by contrast timing, patent. Anatomic variants: None significant. Review of the MIP images confirms the above findings CT Brain Perfusion Findings: ASPECTS: 10 CBF (<30%) Volume: 0mL Perfusion (Tmax>6.0s) volume: Mismatch Volume: Infarction Location:No infarct core. Decreased  perfusion is noted in the bilateral cerebral and cerebellar hemispheres, not conforming to any particular vascular territory but possibly involving the bilateral watershed areas. Some or all of this may also be secondary to motion. IMPRESSION: 1. Evaluation is limited by poor bolus timing and venous contamination. Within this limitation, there is approximately 75% stenosis in the proximal left ICA. No other hemodynamically significant stenosis in the neck. 2. Evaluation of the distal intracranial vasculature is severely limited, as described above. The proximal intracranial vasculature appears patent. 3. On CT perfusion, no infarct core is detected, but there is decreased perfusion in the bilateral cerebral and cerebellar hemispheres, some of which may be in a watershed distribution; however, some or all be artifactual. Imaging results were communicated on 10/23/2021 at 1:48 pm to provider Dr. Pearlean Brownie via secure text paging. Electronically Signed   By: Wiliam Ke M.D.   On: 10/23/2021 13:49   CT HEAD CODE STROKE WO CONTRAST  Result Date: 10/23/2021 CLINICAL DATA:  Code stroke.  Stroke suspected EXAM: CT HEAD WITHOUT CONTRAST TECHNIQUE: Contiguous axial images were obtained from the base of the skull through the vertex without intravenous contrast. RADIATION DOSE REDUCTION: This exam was performed according to the departmental dose-optimization program which includes automated exposure control, adjustment of the mA and/or kV according to patient size and/or use of iterative reconstruction technique. COMPARISON:  12/08/2016 FINDINGS: Brain: No evidence of acute infarction, hemorrhage, cerebral edema, mass, mass effect, or midline shift. Ventricles and sulci are normal for age. No extra-axial fluid collection. Vascular: No hyperdense vessel. Skull: Negative for fracture or focal lesion. Sinuses/Orbits: No acute finding. Other: The mastoid air cells are well aerated. ASPECTS [facility] Stroke Program Early CT Score) -  Ganglionic level infarction (caudate, lentiform nuclei, internal capsule, insula, M1-M3 cortex): 7 - Supraganglionic infarction (M4-M6 cortex): 3 Total score (0-10 with 10 being normal): 10 IMPRESSION: 1. No acute intracranial process. 2. ASPECTS is 10 Code stroke imaging results were communicated on 10/23/2021 at 12:52 pm to provider Dr. Pearlean Brownie via secure text paging. Electronically Signed   By: Wiliam Ke M.D.   On: 10/23/2021 12:53    Procedures Procedures    Medications Ordered in ED Medications  aspirin chewable tablet 81 mg (has no administration in time range)  clopidogrel (PLAVIX) tablet 75 mg (has no administration in time range)  acetaminophen (TYLENOL) tablet 650 mg (has no administration in time range)    Or  acetaminophen (TYLENOL) suppository 650 mg (has no administration in time range)  polyethylene glycol (MIRALAX / GLYCOLAX) packet 17 g (has no administration in time range)  sodium chloride flush (NS) 0.9 % injection 3 mL (3  mLs Intravenous Given 10/23/21 1347)  iohexol (OMNIPAQUE) 350 MG/ML injection 100 mL (100 mLs Intravenous Contrast Given 10/23/21 1317)    ED Course/ Medical Decision Making/ A&P                           Medical Decision Making Amount and/or Complexity of Data Reviewed Labs: ordered. Radiology: ordered.  Risk Decision regarding hospitalization.   This patient presents to the ED for concern of stroke, this involves an extensive number of treatment options, and is a complaint that carries with it a high risk of complications and morbidity.  The differential diagnosis includes CVA, TIA   Co morbidities that complicate the patient evaluation  htn, glaucoma, hyperlipidemia, and anemia   Additional history obtained:  Additional history obtained from epic chart review External records from outside source obtained and reviewed including EMS report   Lab Tests:  I Ordered, and personally interpreted labs.  The pertinent results include:  cbc nl,  cmp nl; etoh neg; inr 1.0   Imaging Studies ordered:  I ordered imaging studies including ct head/  I independently visualized and interpreted imaging which showed  CT head:   IMPRESSION:  1. No acute intracranial process.  CTA: IMPRESSION:  1. Evaluation is limited by poor bolus timing and venous  contamination. Within this limitation, there is approximately 75%  stenosis in the proximal left ICA. No other hemodynamically  significant stenosis in the neck.  2. Evaluation of the distal intracranial vasculature is severely  limited, as described above. The proximal intracranial vasculature  appears patent.  3. On CT perfusion, no infarct core is detected, but there is  decreased perfusion in the bilateral cerebral and cerebellar  hemispheres, some of which may be in a watershed distribution;  however, some or all be artifactual.    Imaging results were communicated on 10/23/2021 at 1:48 pm to provider  Dr. Pearlean Brownie via secure text paging.   I agree with the radiologist interpretation   Cardiac Monitoring:  The patient was maintained on a cardiac monitor.  I personally viewed and interpreted the cardiac monitored which showed an underlying rhythm of: nsr   Medicines ordered and prescription drug management:  I ordered medication including plavix and asa  for cva  Reevaluation of the patient after these medicines showed that the patient stayed the same I have reviewed the patients home medicines and have made adjustments as needed   Test Considered:  mri   Critical Interventions:  Code stroke   Consultations Obtained:  I requested consultation with the neurologist,  and discussed lab and imaging findings as well as pertinent plan - they recommend: admission to medicine I discussed pt with IMTS who will admit.   Problem List / ED Course:  CVA:  MRI pending.  Pt admitted to medicine.  Neurology consulted.   Reevaluation:  After the interventions noted above, I  reevaluated the patient and found that they have :improved   Social Determinants of Health:  Lives at home   Dispostion:  After consideration of the diagnostic results and the patients response to treatment, I feel that the patent would benefit from admission.          Final Clinical Impression(s) / ED Diagnoses Final diagnoses:  Cerebrovascular accident (CVA), unspecified mechanism (HCC)    Rx / DC Orders ED Discharge Orders     None         Jacalyn Lefevre, MD 10/23/21 1556

## 2021-10-23 NOTE — Code Documentation (Signed)
[name] is a 71 yr old male with a PMH of TIA, gout. He states he was last known well "last night" and that today he has sensory loss on left.He is noted to have left hemianopia. He arrived [facility] at 1235. Airway cleared, Labs, CBG obtained at bridge. Pt to CT at 1245. CTH neg for acute hemorrhage per Dr Pearlean Brownie. CTA/P done as well. Pt having very difficult time being still in scanner. Per Dr. Amada Jupiter no acute abnormality on advanced imaging. Pt returned to room 21 where his workup will continue. Bedside handoff this Roberto [name] complete. Pt not candidate for thrombolytic as OOW. Pt not eligible for thrombectomy as imaging negative for LVO.

## 2021-10-23 NOTE — ED Notes (Signed)
Lab called to add on lipid panel and A1c

## 2021-10-23 NOTE — ED Triage Notes (Signed)
Pt BIB [facility] from home as a code stroke. Pt's LSN was last night, woke up this morning w L side weakness and numbness. Pt had a hx of stroke which did not result in any deficits. Pt also has a hx of ETOH abuse, and did have some drink this morning prior EMS arrival. Pt is A&O X4. VSS .

## 2021-10-23 NOTE — ED Notes (Signed)
ED TO INPATIENT HANDOFF REPORT  ED Nurse Name and Phone #: Casey Burkitt [phone]  S Name/Age/Gender Roberto [name] 71 y.o. male Room/Bed: 021C/021C  Code Status   Code Status: Full Code  Home/SNF/Other Home Patient oriented to: self, place, time, and situation Is this baseline? Yes   Triage Complete: Triage complete  Chief Complaint Suspected cerebrovascular accident (CVA) [R09.89]  Triage Note Pt BIB [facility] from home as a code stroke. Pt's LSN was last night, woke up this morning w L side weakness and numbness. Pt had a hx of stroke which did not result in any deficits. Pt also has a hx of ETOH abuse, and did have some drink this morning prior EMS arrival. Pt is A&O X4. VSS .    Allergies No Known Allergies  Level of Care/Admitting Diagnosis ED Disposition     ED Disposition  Admit   Condition  --   Comment  Hospital Area: [place] [[id number]]  Level of Care: Telemetry Medical [104]  May place patient in observation at [facility] or [facility] if equivalent level of care is available:: No  Covid Evaluation: Asymptomatic - no recent exposure (last 10 days) testing not required  Diagnosis: Suspected cerebrovascular accident (CVAUX:[id number]  Admitting Physician: Sid Falcon [phone]  Attending Physician: Cresenciano Lick          B Medical/Surgery History Past Medical History:  Diagnosis Date   Anemia    Glaucoma    Hypertension    Past Surgical History:  Procedure Laterality Date   HERNIA REPAIR       A IV Location/Drains/Wounds Patient Lines/Drains/Airways Status     Active Line/Drains/Airways     Name Placement date Placement time Site Days   Peripheral IV 10/23/21 20 G Anterior;Distal;Left;Upper Arm 10/23/21  --  Arm  less than 1            Intake/Output Last 24 hours No intake or output data in the 24 hours ending 10/23/21 1615  Labs/Imaging Results for orders placed or performed during the hospital encounter of  10/23/21 (from the past 48 hour(s))  CBG monitoring, ED     Status: Abnormal   Collection Time: 10/23/21 12:39 PM  Result Value Ref Range   Glucose-Capillary 105 (H) 70 - 99 mg/dL    Comment: Glucose reference range applies only to samples taken after fasting for at least 8 hours.  Protime-INR     Status: None   Collection Time: 10/23/21 12:41 PM  Result Value Ref Range   Prothrombin Time 13.4 11.4 - 15.2 seconds   INR 1.0 0.8 - 1.2    Comment: (NOTE) INR goal varies based on device and disease states. Performed at [facility], [place] [address] [place], [place] [address]   APTT     Status: None   Collection Time: 10/23/21 12:41 PM  Result Value Ref Range   aPTT 26 24 - 36 seconds    Comment: Performed at [place] [address] [place] [address]  CBC     Status: Abnormal   Collection Time: 10/23/21 12:41 PM  Result Value Ref Range   WBC 9.2 4.0 - 10.5 K/uL   RBC 5.53 4.22 - 5.81 MIL/uL   Hemoglobin 17.7 (H) 13.0 - 17.0 g/dL   HCT 51.1 39.0 - 52.0 %   MCV 92.4 80.0 - 100.0 fL   MCH 32.0 26.0 - 34.0 pg   MCHC 34.6 30.0 - 36.0 g/dL  RDW 12.4 11.5 - 15.5 %   Platelets 226 150 - 400 K/uL   nRBC 0.0 0.0 - 0.2 %    Comment: Performed at [facility], [place] [address] [place], [place] [address]  Differential     Status: Abnormal   Collection Time: 10/23/21 12:41 PM  Result Value Ref Range   Neutrophils Relative % 64 %   Neutro Abs 5.9 1.7 - 7.7 K/uL   Lymphocytes Relative 22 %   Lymphs Abs 2.0 0.7 - 4.0 K/uL   Monocytes Relative 13 %   Monocytes Absolute 1.2 (H) 0.1 - 1.0 K/uL   Eosinophils Relative 0 %   Eosinophils Absolute 0.0 0.0 - 0.5 K/uL   Basophils Relative 0 %   Basophils Absolute 0.0 0.0 - 0.1 K/uL   Immature Granulocytes 1 %   Abs Immature Granulocytes 0.09 (H) 0.00 - 0.07 K/uL    Comment: Performed at [facility] [address], [place], [place] [address]  Comprehensive metabolic panel     Status: Abnormal   Collection  Time: 10/23/21 12:41 PM  Result Value Ref Range   Sodium 133 (L) 135 - 145 mmol/L   Potassium 4.1 3.5 - 5.1 mmol/L   Chloride 100 98 - 111 mmol/L   CO2 22 22 - 32 mmol/L   Glucose, Bld 101 (H) 70 - 99 mg/dL    Comment: Glucose reference range applies only to samples taken after fasting for at least 8 hours.   BUN 12 8 - 23 mg/dL   Creatinine, Ser 0.92 0.61 - 1.24 mg/dL   Calcium 8.8 (L) 8.9 - 10.3 mg/dL   Total Protein 6.7 6.5 - 8.1 g/dL   Albumin 3.3 (L) 3.5 - 5.0 g/dL   AST 32 15 - 41 U/L   ALT 35 0 - 44 U/L   Alkaline Phosphatase 56 38 - 126 U/L   Total Bilirubin 1.1 0.3 - 1.2 mg/dL   GFR, Estimated >60 >60 mL/min    Comment: (NOTE) Calculated using the CKD-EPI Creatinine Equation (2021)    Anion gap 11 5 - 15    Comment: Performed at [facility], [place] [address] [place], [place] [address]  I-stat chem 8, ED     Status: Abnormal   Collection Time: 10/23/21 12:49 PM  Result Value Ref Range   Sodium 132 (L) 135 - 145 mmol/L   Potassium 3.9 3.5 - 5.1 mmol/L   Chloride 101 98 - 111 mmol/L   BUN 13 8 - 23 mg/dL   Creatinine, Ser 0.90 0.61 - 1.24 mg/dL   Glucose, Bld 104 (H) 70 - 99 mg/dL    Comment: Glucose reference range applies only to samples taken after fasting for at least 8 hours.   Calcium, Ion 0.98 (L) 1.15 - 1.40 mmol/L   TCO2 21 (L) 22 - 32 mmol/L   Hemoglobin 17.7 (H) 13.0 - 17.0 g/dL   HCT 52.0 39.0 - 52.0 %  Ethanol     Status: None   Collection Time: 10/23/21  1:31 PM  Result Value Ref Range   Alcohol, Ethyl (B) <10 <10 mg/dL    Comment: (NOTE) Lowest detectable limit for serum alcohol is 10 mg/dL.  For medical purposes only. Performed at [facility], [place] [address] [place], [place] [address]    CT ANGIO HEAD NECK W WO CM W PERF (CODE STROKE)  Result Date: 10/23/2021 CLINICAL DATA:  Stroke suspected EXAM: CT ANGIOGRAPHY HEAD AND NECK CT PERFUSION  BRAIN TECHNIQUE: Multidetector CT imaging of the head and neck was performed using the  standard protocol during bolus administration of intravenous contrast. Multiplanar CT image reconstructions and MIPs were obtained to evaluate the vascular anatomy. Carotid stenosis measurements (when applicable) are obtained utilizing NASCET criteria, using the distal internal carotid diameter as the denominator. Multiphase CT imaging of the brain was performed following IV bolus contrast injection. Subsequent parametric perfusion maps were calculated using RAPID software. RADIATION DOSE REDUCTION: This exam was performed according to the departmental dose-optimization program which includes automated exposure control, adjustment of the mA and/or kV according to patient size and/or use of iterative reconstruction technique. CONTRAST:  OMNIPAQUE IOHEXOL 350 MG/ML SOLN COMPARISON:  No prior CTA, correlation is made with 12/09/2016 MRA head and 10/23/2021 CT head FINDINGS: CT HEAD FINDINGS For noncontrast findings, please see same day CT head. CTA NECK FINDINGS Evaluation is limited by bolus timing and significant contrast in the venous system. Aortic arch: Standard branching. Imaged portion shows no evidence of aneurysm or dissection. No significant stenosis of the major arch vessel origins. Aortic atherosclerosis Right carotid system: No evidence of dissection, occlusion, or hemodynamically significant stenosis (greater than 50%). Atherosclerotic disease at the bifurcation and in the proximal ICA is not hemodynamically significant. Left carotid system: Approximally 75% luminal narrowing of the proximal left ICA secondary to primarily noncalcified plaque no evidence of dissection or occlusion. Vertebral arteries: No evidence of dissection, occlusion, or hemodynamically significant stenosis (greater than 50%). Skeleton: Degenerative changes in the cervical spine with significant disc height loss. Poor dentition with multifocal periapical lucency and dental caries. Other neck: No acute finding. Upper chest: No  focal pulmonary opacity or pleural effusion in the imaged lungs. Review of the MIP images confirms the above findings CTA HEAD FINDINGS Evaluation is limited by bolus timing and degree of venous contamination. Anterior circulation: Both internal carotid arteries are patent to the termini, with mild narrowing in the bilateral cavernous carotids. A1 segments patent. Normal anterior communicating artery. Evaluation of the more distal anterior cerebral arteries is limited by poor opacification but appears grossly patent to the distal aspects. No M1 stenosis or occlusion. Normal MCA bifurcations. Evaluation of the more distal MCAs is limited by poor opacification, but they appear grossly patent to their distal aspects. Posterior circulation: Vertebral arteries patent to the vertebrobasilar junction without stenosis. Basilar patent to its distal aspect. Superior cerebellar arteries are not well opacified. Patent P1 segments. PCAs perfused proximally but are poorly opacified past the anterior P2 segments and evaluation is significantly complicated by venous contamination. The bilateral posterior communicating arteries are not definitively visualized. Venous sinuses: As permitted by contrast timing, patent. Anatomic variants: None significant. Review of the MIP images confirms the above findings CT Brain Perfusion Findings: ASPECTS: 10 CBF (<30%) Volume: 39mL Perfusion (Tmax>6.0s) volume: Mismatch Volume: Infarction Location:No infarct core. Decreased perfusion is noted in the bilateral cerebral and cerebellar hemispheres, not conforming to any particular vascular territory but possibly involving the bilateral watershed areas. Some or all of this may also be secondary to motion. IMPRESSION: 1. Evaluation is limited by poor bolus timing and venous contamination. Within this limitation, there is approximately 75% stenosis in the proximal left ICA. No other hemodynamically significant stenosis in the neck. 2.  Evaluation of the distal intracranial vasculature is severely limited, as described above. The proximal intracranial vasculature appears patent. 3. On CT perfusion, no infarct core is detected, but there is decreased perfusion in the bilateral cerebral and cerebellar hemispheres,  some of which may be in a watershed distribution; however, some or all be artifactual. Imaging results were communicated on 10/23/2021 at 1:48 pm to provider Dr. Leonie Man via secure text paging. Electronically Signed   By: Merilyn Baba M.D.   On: 10/23/2021 13:49   CT HEAD CODE STROKE WO CONTRAST  Result Date: 10/23/2021 CLINICAL DATA:  Code stroke.  Stroke suspected EXAM: CT HEAD WITHOUT CONTRAST TECHNIQUE: Contiguous axial images were obtained from the base of the skull through the vertex without intravenous contrast. RADIATION DOSE REDUCTION: This exam was performed according to the departmental dose-optimization program which includes automated exposure control, adjustment of the mA and/or kV according to patient size and/or use of iterative reconstruction technique. COMPARISON:  12/08/2016 FINDINGS: Brain: No evidence of acute infarction, hemorrhage, cerebral edema, mass, mass effect, or midline shift. Ventricles and sulci are normal for age. No extra-axial fluid collection. Vascular: No hyperdense vessel. Skull: Negative for fracture or focal lesion. Sinuses/Orbits: No acute finding. Other: The mastoid air cells are well aerated. ASPECTS [facility] Stroke Program Early CT Score) - Ganglionic level infarction (caudate, lentiform nuclei, internal capsule, insula, M1-M3 cortex): 7 - Supraganglionic infarction (M4-M6 cortex): 3 Total score (0-10 with 10 being normal): 10 IMPRESSION: 1. No acute intracranial process. 2. ASPECTS is 10 Code stroke imaging results were communicated on 10/23/2021 at 12:52 pm to provider Dr. Leonie Man via secure text paging. Electronically Signed   By: Merilyn Baba M.D.   On: 10/23/2021 12:53    Pending  Labs Unresulted Labs (From admission, onward)     Start     Ordered   10/24/21 [id number]  Basic metabolic panel  Tomorrow morning,   R        10/23/21 1552   10/24/21 0500  CBC  Tomorrow morning,   R        10/23/21 1552   10/23/21 1550  Hemoglobin A1c  Once,   R        10/23/21 1552   10/23/21 1550  Lipid panel  Once,   R        10/23/21 1552            Vitals/Pain Today's Vitals   10/23/21 1532 10/23/21 1535 10/23/21 1535 10/23/21 1538  BP:  (!) 185/119  (!) 185/119  Pulse:  74  82  Resp:  19    Temp:      SpO2:  96%    Weight:      PainSc: 0-No pain  0-No pain     Isolation Precautions No active isolations  Medications Medications  aspirin chewable tablet 81 mg (81 mg Oral Given 10/23/21 1606)  clopidogrel (PLAVIX) tablet 75 mg (75 mg Oral Given 10/23/21 1606)  acetaminophen (TYLENOL) tablet 650 mg (has no administration in time range)    Or  acetaminophen (TYLENOL) suppository 650 mg (has no administration in time range)  polyethylene glycol (MIRALAX / GLYCOLAX) packet 17 g (has no administration in time range)  sodium chloride flush (NS) 0.9 % injection 3 mL (3 mLs Intravenous Given 10/23/21 1347)  iohexol (OMNIPAQUE) 350 MG/ML injection 100 mL (100 mLs Intravenous Contrast Given 10/23/21 1317)    Mobility walks with person assist Moderate fall risk   Focused Assessments Neuro Assessment Handoff:  Swallow screen pass? Yes    NIH Stroke Scale ( + Modified Stroke Scale Criteria)  Interval: Shift assessment Level of Consciousness (1a.)   : Alert, keenly responsive LOC Questions (1b. )   +: Answers both questions correctly LOC  Commands (1c. )   + : Performs both tasks correctly Best Gaze (2. )  +: Normal Visual (3. )  +: Complete hemianopia Facial Palsy (4. )    : Normal symmetrical movements Motor Arm, Left (5a. )   +: No drift Motor Arm, Right (5b. )   +: No drift Motor Leg, Left (6a. )   +: No drift Motor Leg, Right (6b. )   +: No drift Limb Ataxia (7. ):  Absent Sensory (8. )   +: Mild-to-moderate sensory loss, patient feels pinprick is less sharp or is dull on the affected side, or there is a loss of superficial pain with pinprick, but patient is aware of being touched Best Language (9. )   +: No aphasia Dysarthria (10. ): Normal Extinction/Inattention (11.)   +: No Abnormality Modified SS Total  +: 3 Complete NIHSS TOTAL: 3 Last date known well: 10/22/21 Last time known well:  ("last night") Neuro Assessment: Exceptions to WDL Neuro Checks:   Initial (10/23/21 1230)  Last Documented NIHSS Modified Score: 3 (10/23/21 1536) Has TPA been given? No If patient is a Neuro Trauma and patient is going to OR before floor call report to [facility] nurse: [phone] or [phone]   R Recommendations: See Admitting Provider Note  Report given to:   Additional Notes: Pt has left sided numbness in face, arm, and leg. Also reports loss of vision in left eye. A&O x4, uses urinal, waiting to go to MRI.

## 2021-10-23 NOTE — Consult Note (Signed)
Reason for Consult: Roberto [name] Referring Physician: Code stroke  Roberto [name] is an 71 y.o. male.  HPI: Roberto [name] is a 71 year old Caucasian male with past medical history of hypertension, glaucoma and gout who presented today as a code stroke.  Initial history obtained by EMS was that patient woke up in the morning feeling fine and at 845 developed sudden onset of left-sided numbness.  However in the talk with the patient states that yesterday he was dropping objects with his left hand and at times felt left leg was giving out.  Symptoms may have been present upon awakening and got worse later on in the morning.  Still has persistent left-sided numbness.  Complains of mild headache but denies other associated symptoms in the form of double vision, vertigo, extremity weakness.  Does have prior history of TIA in 2017 and similar left-sided numbness weakness.  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  Patient was supposed to be on aspirin but states he was not taking it.  He had borderline hyperlipidemia with LDL 81.  He was started on Lipitor.  Continue current carotid ultrasound unremarkable Code stroke CT scan was reviewed personally shows no acute abnormality with aspect score of 10. Emergent CT angiogram evaluation was limited by poor bolus timing and venous contamination but suggests around 75% proximal left ICA stenosis with no intracranial occlusion posterior circulation flow-voids appear to be of diminutive caliber.  CT perfusion study is artifactual and shows diminished perfusion in both cerebral and cerebellar hemispheres which is not evident specific vascular distribution. Past Medical History:  Diagnosis Date   Anemia    Glaucoma    Hypertension     Past Surgical History:  Procedure Laterality Date   HERNIA REPAIR      Family History  Problem Relation Age of Onset   Heart disease Mother    Stroke  Father     Social History:  reports that he has never smoked. He has never used smokeless tobacco. He reports current alcohol use of about 1.0 standard drink of alcohol per week. He reports that he does not use drugs.  Allergies: No Known Allergies  Medications: I have reviewed the patient's current medications. Prior to Admission: (Not in a hospital admission)   Results for orders placed or performed during the hospital encounter of 10/23/21 (from the past 48 hour(s))  CBG monitoring, ED     Status: Abnormal   Collection Time: 10/23/21 12:39 PM  Result Value Ref Range   Glucose-Capillary 105 (H) 70 - 99 mg/dL    Comment: Glucose reference range applies only to samples taken after fasting for at least 8 hours.  Protime-INR     Status: None   Collection Time: 10/23/21 12:41 PM  Result Value Ref Range   Prothrombin Time 13.4 11.4 - 15.2 seconds   INR 1.0 0.8 - 1.2    Comment: (NOTE) INR goal varies based on device and disease states. Performed at [facility], [address] [place], [place] [address]   APTT     Status: None   Collection Time: 10/23/21 12:41 PM  Result Value Ref Range   aPTT 26 24 - 36 seconds    Comment: Performed at [facility], [address], [place], [place] [address]  CBC     Status: Abnormal   Collection Time: 10/23/21 12:41 PM  Result Value Ref Range   WBC  9.2 4.0 - 10.5 K/uL   RBC 5.53 4.22 - 5.81 MIL/uL   Hemoglobin 17.7 (H) 13.0 - 17.0 g/dL   HCT 16.151.1 09.639.0 - 04.552.0 %   MCV 92.4 80.0 - 100.0 fL   MCH 32.0 26.0 - 34.0 pg   MCHC 34.6 30.0 - 36.0 g/dL   RDW 40.912.4 81.111.5 - 91.415.5 %   Platelets 226 150 - 400 K/uL   nRBC 0.0 0.0 - 0.2 %    Comment: Performed at [facility], [address] [place], [place] [address]  Differential     Status: Abnormal   Collection Time: 10/23/21 12:41 PM  Result Value Ref Range   Neutrophils Relative % 64 %   Neutro Abs 5.9 1.7 - 7.7 K/uL   Lymphocytes Relative 22 %   Lymphs Abs 2.0 0.7 - 4.0 K/uL    Monocytes Relative 13 %   Monocytes Absolute 1.2 (H) 0.1 - 1.0 K/uL   Eosinophils Relative 0 %   Eosinophils Absolute 0.0 0.0 - 0.5 K/uL   Basophils Relative 0 %   Basophils Absolute 0.0 0.0 - 0.1 K/uL   Immature Granulocytes 1 %   Abs Immature Granulocytes 0.09 (H) 0.00 - 0.07 K/uL    Comment: Performed at [facility], [address] [place], [place] [address]  Comprehensive metabolic panel     Status: Abnormal   Collection Time: 10/23/21 12:41 PM  Result Value Ref Range   Sodium 133 (L) 135 - 145 mmol/L   Potassium 4.1 3.5 - 5.1 mmol/L   Chloride 100 98 - 111 mmol/L   CO2 22 22 - 32 mmol/L   Glucose, Bld 101 (H) 70 - 99 mg/dL    Comment: Glucose reference range applies only to samples taken after fasting for at least 8 hours.   BUN 12 8 - 23 mg/dL   Creatinine, Ser 8.650.92 0.61 - 1.24 mg/dL   Calcium 8.8 (L) 8.9 - 10.3 mg/dL   Total Protein 6.7 6.5 - 8.1 g/dL   Albumin 3.3 (L) 3.5 - 5.0 g/dL   AST 32 15 - 41 U/L   ALT 35 0 - 44 U/L   Alkaline Phosphatase 56 38 - 126 U/L   Total Bilirubin 1.1 0.3 - 1.2 mg/dL   GFR, Estimated >78>60 >46>60 mL/min    Comment: (NOTE) Calculated using the CKD-EPI Creatinine Equation (2021)    Anion gap 11 5 - 15    Comment: Performed at [facility], [address] [place], [place] [address]  I-stat chem 8, ED     Status: Abnormal   Collection Time: 10/23/21 12:49 PM  Result Value Ref Range   Sodium 132 (L) 135 - 145 mmol/L   Potassium 3.9 3.5 - 5.1 mmol/L   Chloride 101 98 - 111 mmol/L   BUN 13 8 - 23 mg/dL   Creatinine, Ser 2.840.90 0.61 - 1.24 mg/dL   Glucose, Bld 132104 (H) 70 - 99 mg/dL    Comment: Glucose reference range applies only to samples taken after fasting for at least 8 hours.   Calcium, Ion 0.98 (L) 1.15 - 1.40 mmol/L   TCO2 21 (L) 22 - 32 mmol/L   Hemoglobin 17.7 (H) 13.0 - 17.0 g/dL   HCT 44.052.0 10.239.0 - 72.552.0 %    CT ANGIO HEAD NECK W WO CM W PERF (CODE STROKE)  Result Date: 10/23/2021 CLINICAL DATA:  Stroke suspected EXAM:  CT ANGIOGRAPHY HEAD AND NECK CT PERFUSION BRAIN TECHNIQUE: Multidetector CT  imaging of the head and neck was performed using the standard protocol during bolus administration of intravenous contrast. Multiplanar CT image reconstructions and MIPs were obtained to evaluate the vascular anatomy. Carotid stenosis measurements (when applicable) are obtained utilizing NASCET criteria, using the distal internal carotid diameter as the denominator. Multiphase CT imaging of the brain was performed following IV bolus contrast injection. Subsequent parametric perfusion maps were calculated using RAPID software. RADIATION DOSE REDUCTION: This exam was performed according to the departmental dose-optimization program which includes automated exposure control, adjustment of the mA and/or kV according to patient size and/or use of iterative reconstruction technique. CONTRAST:  OMNIPAQUE IOHEXOL 350 MG/ML SOLN COMPARISON:  No prior CTA, correlation is made with 12/09/2016 MRA head and 10/23/2021 CT head FINDINGS: CT HEAD FINDINGS For noncontrast findings, please see same day CT head. CTA NECK FINDINGS Evaluation is limited by bolus timing and significant contrast in the venous system. Aortic arch: Standard branching. Imaged portion shows no evidence of aneurysm or dissection. No significant stenosis of the major arch vessel origins. Aortic atherosclerosis Right carotid system: No evidence of dissection, occlusion, or hemodynamically significant stenosis (greater than 50%). Atherosclerotic disease at the bifurcation and in the proximal ICA is not hemodynamically significant. Left carotid system: Approximally 75% luminal narrowing of the proximal left ICA secondary to primarily noncalcified plaque no evidence of dissection or occlusion. Vertebral arteries: No evidence of dissection, occlusion, or hemodynamically significant stenosis (greater than 50%). Skeleton: Degenerative changes in the cervical spine with significant disc  height loss. Poor dentition with multifocal periapical lucency and dental caries. Other neck: No acute finding. Upper chest: No focal pulmonary opacity or pleural effusion in the imaged lungs. Review of the MIP images confirms the above findings CTA HEAD FINDINGS Evaluation is limited by bolus timing and degree of venous contamination. Anterior circulation: Both internal carotid arteries are patent to the termini, with mild narrowing in the bilateral cavernous carotids. A1 segments patent. Normal anterior communicating artery. Evaluation of the more distal anterior cerebral arteries is limited by poor opacification but appears grossly patent to the distal aspects. No M1 stenosis or occlusion. Normal MCA bifurcations. Evaluation of the more distal MCAs is limited by poor opacification, but they appear grossly patent to their distal aspects. Posterior circulation: Vertebral arteries patent to the vertebrobasilar junction without stenosis. Basilar patent to its distal aspect. Superior cerebellar arteries are not well opacified. Patent P1 segments. PCAs perfused proximally but are poorly opacified past the anterior P2 segments and evaluation is significantly complicated by venous contamination. The bilateral posterior communicating arteries are not definitively visualized. Venous sinuses: As permitted by contrast timing, patent. Anatomic variants: None significant. Review of the MIP images confirms the above findings CT Brain Perfusion Findings: ASPECTS: 10 CBF (<30%) Volume: 0mL Perfusion (Tmax>6.0s) volume: Mismatch Volume: Infarction Location:No infarct core. Decreased perfusion is noted in the bilateral cerebral and cerebellar hemispheres, not conforming to any particular vascular territory but possibly involving the bilateral watershed areas. Some or all of this may also be secondary to motion. IMPRESSION: 1. Evaluation is limited by poor bolus timing and venous contamination. Within this limitation,  there is approximately 75% stenosis in the proximal left ICA. No other hemodynamically significant stenosis in the neck. 2. Evaluation of the distal intracranial vasculature is severely limited, as described above. The proximal intracranial vasculature appears patent. 3. On CT perfusion, no infarct core is detected, but there is decreased perfusion in the bilateral cerebral and cerebellar hemispheres, some of which may be  in a watershed distribution; however, some or all be artifactual. Imaging results were communicated on 10/23/2021 at 1:48 pm to provider Dr. Pearlean Brownie via secure text paging. Electronically Signed   By: Wiliam Ke M.D.   On: 10/23/2021 13:49   CT HEAD CODE STROKE WO CONTRAST  Result Date: 10/23/2021 CLINICAL DATA:  Code stroke.  Stroke suspected EXAM: CT HEAD WITHOUT CONTRAST TECHNIQUE: Contiguous axial images were obtained from the base of the skull through the vertex without intravenous contrast. RADIATION DOSE REDUCTION: This exam was performed according to the departmental dose-optimization program which includes automated exposure control, adjustment of the mA and/or kV according to patient size and/or use of iterative reconstruction technique. COMPARISON:  12/08/2016 FINDINGS: Brain: No evidence of acute infarction, hemorrhage, cerebral edema, mass, mass effect, or midline shift. Ventricles and sulci are normal for age. No extra-axial fluid collection. Vascular: No hyperdense vessel. Skull: Negative for fracture or focal lesion. Sinuses/Orbits: No acute finding. Other: The mastoid air cells are well aerated. ASPECTS [facility] Stroke Program Early CT Score) - Ganglionic level infarction (caudate, lentiform nuclei, internal capsule, insula, M1-M3 cortex): 7 - Supraganglionic infarction (M4-M6 cortex): 3 Total score (0-10 with 10 being normal): 10 IMPRESSION: 1. No acute intracranial process. 2. ASPECTS is 10 Code stroke imaging results were communicated on 10/23/2021 at 12:52 pm to provider Dr.  Pearlean Brownie via secure text paging. Electronically Signed   By: Wiliam Ke M.D.   On: 10/23/2021 12:53       ROS Blood pressure (!) 180/107, pulse 72, temperature 98.8 F (37.1 C), resp. rate 20, weight 81.6 kg, SpO2 95 %. Physical Exam 1a Level of Conscious.: 0 1b LOC Questions: 0 1c LOC Commands: 0 2 Best Gaze: 0 3 Visual: 2 4 Facial Palsy: 0 5a Motor Arm - left: 0 5b Motor Arm - Right: 0 6a Motor Leg - Left: 0 6b Motor Leg - Right: 0 7 Limb Ataxia: 0 8 Sensory: 1 9 Best Language: 0 10 Dysarthria: 0 11 Extinct. and Inatten.: 0 TOTAL: 3 Assessment/Plan: 71 year old Caucasian male with 2-day history of left-sided numbness possibly due to right PCA branch of thalamic infarct.  Patient has presented outside time window for thrombolysis and CT angiogram did not show any LVO.  Vascular risk factors of hypertension, hyperlipidemia, extracranial ICA stenosis. Recommendations : Admit to the medical team for ongoing stroke work-up.  Check MRI scan of the brain, echocardiogram, lipid profile, hemoglobin A1c.  Physical occupational and speech therapy consults.  Dual antiplatelet therapy of aspirin 81 mg and Plavix 75 mg daily for 3 weeks followed by Plavix alone.  Aggressive risk factor modification.  Long discussion with patient and his granddaughter at the bedside as well as with his girlfriend Roberto [name] over the phone and answered questions.  Discussed with Dr. Rise Mu Greater than 50% time during this 80-minute consultation visit was spent on counseling and coordination of care about his stroke and discussion about stroke evaluation treatment and prevention and answering questions Delia Heady 10/23/2021, 2:21 PM    Note: This document was prepared with digital dictation and possible smart phrase technology. Any transcriptional errors that result from this process are unintentional.

## 2021-10-24 ENCOUNTER — Observation Stay (HOSPITAL_COMMUNITY): Payer: PRIVATE HEALTH INSURANCE

## 2021-10-24 ENCOUNTER — Inpatient Hospital Stay (HOSPITAL_COMMUNITY): Payer: PRIVATE HEALTH INSURANCE

## 2021-10-24 DIAGNOSIS — R7303 Prediabetes: Secondary | ICD-10-CM | POA: Diagnosis present

## 2021-10-24 DIAGNOSIS — J690 Pneumonitis due to inhalation of food and vomit: Secondary | ICD-10-CM | POA: Diagnosis not present

## 2021-10-24 DIAGNOSIS — A4102 Sepsis due to Methicillin resistant Staphylococcus aureus: Secondary | ICD-10-CM | POA: Diagnosis not present

## 2021-10-24 DIAGNOSIS — R7881 Bacteremia: Secondary | ICD-10-CM | POA: Diagnosis not present

## 2021-10-24 DIAGNOSIS — I6389 Other cerebral infarction: Secondary | ICD-10-CM | POA: Diagnosis not present

## 2021-10-24 DIAGNOSIS — N179 Acute kidney failure, unspecified: Secondary | ICD-10-CM | POA: Diagnosis not present

## 2021-10-24 DIAGNOSIS — G8194 Hemiplegia, unspecified affecting left nondominant side: Secondary | ICD-10-CM | POA: Diagnosis present

## 2021-10-24 DIAGNOSIS — Z833 Family history of diabetes mellitus: Secondary | ICD-10-CM | POA: Diagnosis not present

## 2021-10-24 DIAGNOSIS — I10 Essential (primary) hypertension: Secondary | ICD-10-CM | POA: Diagnosis present

## 2021-10-24 DIAGNOSIS — R6521 Severe sepsis with septic shock: Secondary | ICD-10-CM | POA: Diagnosis not present

## 2021-10-24 DIAGNOSIS — B954 Other streptococcus as the cause of diseases classified elsewhere: Secondary | ICD-10-CM | POA: Diagnosis not present

## 2021-10-24 DIAGNOSIS — D649 Anemia, unspecified: Secondary | ICD-10-CM | POA: Diagnosis present

## 2021-10-24 DIAGNOSIS — R29710 NIHSS score 10: Secondary | ICD-10-CM | POA: Diagnosis not present

## 2021-10-24 DIAGNOSIS — B9562 Methicillin resistant Staphylococcus aureus infection as the cause of diseases classified elsewhere: Secondary | ICD-10-CM | POA: Diagnosis not present

## 2021-10-24 DIAGNOSIS — Z8249 Family history of ischemic heart disease and other diseases of the circulatory system: Secondary | ICD-10-CM | POA: Diagnosis not present

## 2021-10-24 DIAGNOSIS — Z7982 Long term (current) use of aspirin: Secondary | ICD-10-CM | POA: Diagnosis not present

## 2021-10-24 DIAGNOSIS — E222 Syndrome of inappropriate secretion of antidiuretic hormone: Secondary | ICD-10-CM | POA: Diagnosis present

## 2021-10-24 DIAGNOSIS — I6522 Occlusion and stenosis of left carotid artery: Secondary | ICD-10-CM | POA: Diagnosis present

## 2021-10-24 DIAGNOSIS — M7989 Other specified soft tissue disorders: Secondary | ICD-10-CM | POA: Diagnosis not present

## 2021-10-24 DIAGNOSIS — Z79899 Other long term (current) drug therapy: Secondary | ICD-10-CM | POA: Diagnosis not present

## 2021-10-24 DIAGNOSIS — I340 Nonrheumatic mitral (valve) insufficiency: Secondary | ICD-10-CM | POA: Diagnosis not present

## 2021-10-24 DIAGNOSIS — D751 Secondary polycythemia: Secondary | ICD-10-CM | POA: Diagnosis present

## 2021-10-24 DIAGNOSIS — I639 Cerebral infarction, unspecified: Secondary | ICD-10-CM | POA: Diagnosis present

## 2021-10-24 DIAGNOSIS — I69354 Hemiplegia and hemiparesis following cerebral infarction affecting left non-dominant side: Secondary | ICD-10-CM | POA: Diagnosis not present

## 2021-10-24 DIAGNOSIS — R29703 NIHSS score 3: Secondary | ICD-10-CM | POA: Diagnosis present

## 2021-10-24 DIAGNOSIS — R0989 Other specified symptoms and signs involving the circulatory and respiratory systems: Secondary | ICD-10-CM | POA: Diagnosis present

## 2021-10-24 DIAGNOSIS — R414 Neurologic neglect syndrome: Secondary | ICD-10-CM | POA: Diagnosis present

## 2021-10-24 DIAGNOSIS — H53462 Homonymous bilateral field defects, left side: Secondary | ICD-10-CM | POA: Diagnosis present

## 2021-10-24 DIAGNOSIS — I63431 Cerebral infarction due to embolism of right posterior cerebral artery: Secondary | ICD-10-CM | POA: Diagnosis present

## 2021-10-24 DIAGNOSIS — W19XXXA Unspecified fall, initial encounter: Secondary | ICD-10-CM | POA: Diagnosis present

## 2021-10-24 DIAGNOSIS — E785 Hyperlipidemia, unspecified: Secondary | ICD-10-CM | POA: Diagnosis present

## 2021-10-24 DIAGNOSIS — H409 Unspecified glaucoma: Secondary | ICD-10-CM | POA: Diagnosis present

## 2021-10-24 DIAGNOSIS — E872 Acidosis, unspecified: Secondary | ICD-10-CM | POA: Diagnosis not present

## 2021-10-24 DIAGNOSIS — I63531 Cerebral infarction due to unspecified occlusion or stenosis of right posterior cerebral artery: Secondary | ICD-10-CM | POA: Diagnosis not present

## 2021-10-24 DIAGNOSIS — B950 Streptococcus, group A, as the cause of diseases classified elsewhere: Secondary | ICD-10-CM | POA: Diagnosis present

## 2021-10-24 DIAGNOSIS — Q2112 Patent foramen ovale: Secondary | ICD-10-CM | POA: Diagnosis not present

## 2021-10-24 LAB — ECHOCARDIOGRAM COMPLETE
AR max vel: 2.64 cm2
AV Area VTI: 2.65 cm2
AV Area mean vel: 2.62 cm2
AV Mean grad: 1 mmHg
AV Peak grad: 2.8 mmHg
Ao pk vel: 0.83 m/s
Area-P 1/2: 3.91 cm2
S' Lateral: 2.6 cm
Weight: 2719.59 oz

## 2021-10-24 LAB — CBC
HCT: 52.1 % — ABNORMAL HIGH (ref 39.0–52.0)
Hemoglobin: 18.2 g/dL — ABNORMAL HIGH (ref 13.0–17.0)
MCH: 32.3 pg (ref 26.0–34.0)
MCHC: 34.9 g/dL (ref 30.0–36.0)
MCV: 92.5 fL (ref 80.0–100.0)
Platelets: 244 10*3/uL (ref 150–400)
RBC: 5.63 MIL/uL (ref 4.22–5.81)
RDW: 12.7 % (ref 11.5–15.5)
WBC: 8.3 10*3/uL (ref 4.0–10.5)
nRBC: 0 % (ref 0.0–0.2)

## 2021-10-24 LAB — BASIC METABOLIC PANEL
Anion gap: 9 (ref 5–15)
BUN: 15 mg/dL (ref 8–23)
CO2: 23 mmol/L (ref 22–32)
Calcium: 8.7 mg/dL — ABNORMAL LOW (ref 8.9–10.3)
Chloride: 100 mmol/L (ref 98–111)
Creatinine, Ser: 0.9 mg/dL (ref 0.61–1.24)
GFR, Estimated: >60 mL/min (ref >60)
Glucose, Bld: 134 mg/dL — ABNORMAL HIGH (ref 70–99)
Potassium: 3.8 mmol/L (ref 3.5–5.1)
Sodium: 132 mmol/L — ABNORMAL LOW (ref 135–145)

## 2021-10-24 MED ORDER — ATORVASTATIN CALCIUM 80 MG PO TABS
80.0000 mg | ORAL_TABLET | Freq: Every day | ORAL | Status: DC
  Administered 2021-10-24 – 2021-11-06 (×14): 80 mg via ORAL
  Filled 2021-10-24 (×14): qty 1

## 2021-10-24 MED ORDER — ATORVASTATIN CALCIUM 40 MG PO TABS: 40.0000 mg | ORAL_TABLET | Freq: Every day | ORAL | Status: DC

## 2021-10-24 MED ORDER — ACETAMINOPHEN-CODEINE 300-30 MG PO TABS
2.0000 | ORAL_TABLET | Freq: Four times a day (QID) | ORAL | Status: DC | PRN
  Administered 2021-10-24 – 2021-10-26 (×3): 2 via ORAL
  Filled 2021-10-24 (×3): qty 2

## 2021-10-24 MED ORDER — SODIUM CHLORIDE 0.9 % BOLUS PEDS
500.0000 mL | Freq: Once | INTRAVENOUS | Status: DC
Start: 2021-10-24 — End: 2021-10-24

## 2021-10-24 MED ORDER — STUDY - OCEANIC-STROKE - ASUNDEXIAN 50 MG OR PLACEBO TABLET (PI-SETHI)
1.0000 | ORAL_TABLET | Freq: Every day | ORAL | Status: DC
Start: 2021-10-24 — End: 2021-11-07
  Administered 2021-10-24 – 2021-11-06 (×14): 50 mg via ORAL
  Filled 2021-10-24 (×15): qty 1

## 2021-10-24 MED ORDER — SODIUM CHLORIDE 0.9 % IV BOLUS
500.0000 mL | Freq: Once | INTRAVENOUS | Status: AC
  Administered 2021-10-24: 500 mL via INTRAVENOUS

## 2021-10-24 MED ORDER — ACETAMINOPHEN 500 MG PO TABS
1000.0000 mg | ORAL_TABLET | Freq: Four times a day (QID) | ORAL | Status: DC | PRN
  Administered 2021-10-24 – 2021-11-06 (×18): 1000 mg via ORAL
  Filled 2021-10-24 (×19): qty 2

## 2021-10-24 MED ORDER — SODIUM CHLORIDE 0.9 % IV SOLN: INTRAVENOUS | Status: DC

## 2021-10-24 NOTE — Progress Notes (Signed)
  Echocardiogram 2D Echocardiogram has been performed.  Roberto [name] F 10/24/2021, 5:10 PM

## 2021-10-24 NOTE — Progress Notes (Signed)
Inpatient Rehab Admissions Coordinator:  ? ?Per therapy recommendations,  patient was screened for CIR candidacy by Juliane Guest, MS, CCC-SLP. At this time, Pt. Appears to be a a potential candidate for CIR. I will place   order for rehab consult per protocol for full assessment. Please contact me any with questions. ? ?Kenzli Barritt, MS, CCC-SLP ?Rehab Admissions Coordinator  ?[phone] (celll) ?[phone] (office) ? ?

## 2021-10-24 NOTE — Progress Notes (Addendum)
10/24/21 0900  Charting Type  Charting Type Shift Assessment  [place] Work Intensity Score (Update with each assessment and as needed)  Work Intensity Score (Level) 3  Level 3 Intensity B.Frequent assistance requests (patient or family);C.High risk for fall/injury  NOT following commands  Neurological  Neuro (WDL) X  Orientation Level Oriented to person;Oriented to place;Oriented to situation;Disoriented to time  Cognition Follows commands  Speech Clear  R Pupil Size (mm) 3  R Pupil Shape Round  R Pupil Reaction Brisk  L Pupil Size (mm) 3  L Pupil Shape Round  L Pupil Reaction Brisk  R Hand Grip Strong  L Hand Grip Weak   RUE Motor Response Purposeful movement  RUE Sensation Full sensation  RUE Motor Strength 5  LUE Motor Response Purposeful movement  LUE Sensation Numbness;Tingling;Decreased  LUE Motor Strength 3  RLE Motor Response Purposeful movement  RLE Sensation Full sensation  RLE Motor Strength 5  LLE Motor Response Purposeful movement  LLE Sensation Decreased  LLE Motor Strength 5  Neuro Symptoms Anxiety  Glasgow Coma Scale  Eye Opening 4  Best Verbal Response (NON-intubated) 5  Best Motor Response 6  Glasgow Coma Scale Score 15  NIH Stroke Scale ( + Modified Stroke Scale Criteria)   Interval Shift assessment  Level of Consciousness (1a.)    0  LOC Questions (1b. )   + 0  LOC Commands (1c. )   +  0  Best Gaze (2. )  + 0  Visual (3. )  + 2  Facial Palsy (4. )     0  Motor Arm, Left (5a. )   + 1  Motor Arm, Right (5b. )   + 0  Motor Leg, Left (6a. )   + 1  Motor Leg, Right (6b. )   + 0  Limb Ataxia (7. ) 1  Sensory (8. )   + 1  Best Language (9. )   + 0  Dysarthria (10. ) 1  Extinction/Inattention (11.)   + 0  Modified SS Total  + 5  Complete NIHSS TOTAL 7  NuDESC - Delirium Risk Factor Assessment (Complete for non-ICU patients)  Delirium Risk Factor Assessment Severe illness / infection  NuDESC - Nursing Delirium Screening Scale (Complete for  non-ICU patients)  Disorientation 0  Inappropriate Behavior 0  Inappropriate Communications 0  Illusions/hallucinations 0  Psychomotor Retardation 0  NuDESC Total Score 0  NuDESC - Delirium Prevention:  Universal Requirements (Complete for all non-ICU patients with a delirium risk factor)  Universal Precautions Initiated *See Row Information* Yes  HEENT  HEENT (WDL) X  Vision Check No  Teeth Missing (Comment)  Tongue Pink;Moist  Mucous Membrane(s) Moist;Pink  Respiratory  Respiratory (WDL) WDL  Cardiac  Cardiac (WDL) WDL  ECG Monitoring  Cardiac Rhythm NSR  Antiarrhythmic device  Antiarrhythmic device No  Vascular  Vascular (WDL) WDL  Musculoskeletal  Musculoskeletal (WDL) WDL  Gastrointestinal  Gastrointestinal (WDL) WDL  GU Assessment  Genitourinary (WDL) WDL  Genitalia  Male Genitalia Intact  Psychosocial  Psychosocial (WDL) WDL  Integumentary  Skin Color Appropriate for ethnicity  Skin Condition Dry  Skin Integrity Other (Comment);Abrasion (Scarttered poison oaks lessions)  Abrasion Location Buttocks  Abrasion Location Orientation Left  Progressive Mobility Assessment: RN to perform for Med/Surg and Progressive Care at admission/transfer/prn  Does patient have an order for bedrest or is patient medically unstable No - Continue assessment  What is the highest level of mobility based on the progressive mobility  assessment? Level 2 (Chairfast) - Balance while sitting on edge of bed and cannot stand  Is the above level different from baseline mobility prior to current illness? Yes - Recommend PT order  Braden Scale (Ages 8 and up)  Sensory Perceptions 3  Moisture 4  Activity 1  Mobility 3  Nutrition 3  Friction and Shear 3  Braden Scale Score 17  Braden Interventions  Braden Scale Interventions Careplan  Patient not compliant with following measures Reposition every 2 hours;Reposition chair bound patients every hour  Neurological  Level of Consciousness Alert   Increased weakness noted to left upper arm during MD rounding at this time. CT scan ordered.

## 2021-10-24 NOTE — Progress Notes (Signed)
Stroke Team Progress Note  SUBJECTIVE Patient had subjective worsening of his left-sided weakness and numbness overnight.  MRI scan of the brain had shown patchy right PCA infarct involving medial temporal lobes thalamus.  CT scan of the head done this morning shows expected evolutionary changes in the right PCA infarct without any hemorrhagic transformation or acute change.  LDL cholesterol 52 mg percent.  Triglycerides are elevated at 272 mg percent.  Hemoglobin A1c is 5.8.  Hematocrit is slightly elevated at 52 but looking through his chart he seems to be chronic tissue.  Patient denies smoking  OBJECTIVE Most recent Vital Signs: Temp: 98 F (36.7 C) (06/10 1134) Temp Source: Oral (06/10 0316) BP: 139/98 (06/10 1134) Pulse Rate: 90 (06/10 1134) Respiratory Rate: (!) 23 O2 Saturdation: 93%  CBG (last 3)  Recent Labs    10/23/21 1239  GLUCAP 105*       Studies:  CT head 10/24/2021 expected evolutionary changes in the right PCA infarct no hemorrhagic conversion or acute change. MRI brain without contrast multifocal ischemic changes in the right PCA territory involving dorsal thalamus, medial right temporal lobe and right occipital lobe.  No hemorrhage or mass effect.   CTA evaluation is limited by poor bolus timing of venous contamination.  Approximate 75% stenosis in proximal left ICA.  Evaluation of distal intracranial vasculature is severely limited. CT perfusion no core infarct but decreased perfusion in bilateral cerebellar and cerebral hemispheres likely artifactual. ECHO pending  LDL 52 mg percent HbA1c 5.8 Physical Exam:    [name] not in distress. . Afebrile. Head is nontraumatic. Neck is supple without bruit.    Cardiac exam no murmur or gallop. Lungs are clear to auscultation. Distal pulses are well felt.  Neurological Exam ; Awake alert oriented to time place and person.  Speech and language appear normal.  Extraocular movements are full range  without nystagmus.  Dense left homonymous hemianopsia.  Symmetric facial movements without weakness.  Subjective left face sensory loss in the lower portion.  Palatal movements are normal.  Tongue midline.  Motor system exam shows no upper extremity drift but diminished fine finger movements on the left and orbits right over left upper extremity.  Mild weakness of left grip.  Left lower extremity drift but leg is noted to ground.  Mild weakness of left hip flexors 4+ out of 5.  Diminished left hemibody touch pinprick sensation.  Reflexes are symmetric.  Plantars are downgoing.  Gait not tested  NIH stroke scale 3. 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 He presented outside time window for thrombolysis and CT angiogram did not show any LVO.  Vascular risk factors of hypertension, hyperlipidemia, extracranial ICA stenosis.    Hospital day # 0  TREATMENT/PLAN Patient has shown slight neurological worsening this morning.  Recommend he stay in bed today.  IV normal saline 500 cc bolus and start normal saline 70 cc an hour.  Continue dual antiplatelet therapy of aspirin and Plavix for 3 weeks followed by Plavix alone and aggressive risk factor modification.  Mobilize out of bed.  Therapy consults.  Patient may also benefit with consideration for possible participation in the [facility] stroke prevention trial  (standard antiplatelet therapy plus factor XI inhibitor Asundexian  versus standard medical therapy plus placebo ).  He was given written information to review and decide.  Discussed with patient, his wife  and Dr. Gilles Chiquito and internal medicine teaching service. Greater than 50% time during this 50-minute visit was spent on counseling and coordination of care about his PCA infarct and discussion about evaluation and treatment plan and answering questions Antony Contras, MD Medical Director [facility] Pager: [phone] 10/24/2021 1:54 PM

## 2021-10-24 NOTE — Progress Notes (Addendum)
HD#0 SUBJECTIVE:  Patient Summary: Roberto [name] is a 71 y.o. with a pertinent PMH of CVA in 2017, who presented with L sided numbness and admitted for R PCA infarct.   Overnight Events: Patient endorsed headache and itching from poison oak rash.  Interim History: Patient appears anxious this morning and is worried about regaining normal sensation to his face and R side of the body as well as the ability to walk normally. We discussed the findings of his MRI and plan for today. Neuro exam was changed from admission (see documentation in physical exam). No overnight neuro exams were documented so unclear at this time when change in status occurred.  OBJECTIVE:  Vital Signs: Vitals:   10/23/21 2035 10/23/21 2300 10/24/21 0316 10/24/21 0822  BP: (!) 155/103 (!) 162/102 (!) 152/90 (!) 152/93  Pulse: 88 83 72 87  Resp: 20 18 16 18   Temp: (!) 97.5 F (36.4 C) 98 F (36.7 C) 97.9 F (36.6 C) 98.4 F (36.9 C)  TempSrc: Oral Oral Oral   SpO2: 94% 94% 96% 93%  Weight:   77.1 kg    Supplemental O2: Room Air SpO2: 93 %  Filed Weights   10/23/21 1200 10/24/21 0316  Weight: 81.6 kg 77.1 kg     Intake/Output Summary (Last 24 hours) at 10/24/2021 0933 Last data filed at 10/24/2021 0247 Gross per 24 hour  Intake --  Output 600 ml  Net -600 ml   Net IO Since Admission: -600 mL [10/24/21 0933]  Physical Exam: Constitutional:No acute distress.  HENT:Rhinophyma present. Cardio:Regular rate and rhythm. No murmurs, rubs, gallops appreciated. Pulm:Clear to auscultation bilaterally.  [id number] for extremity edema.  Skin:Warm and dry with rhinophyma and telangiectasias noted over the face with increased pigmentation over the nasal bridge and cheeks, brown line resembling rosacea. Scabbed poison oak lesions persist over the L forearm. Neuro: Mental Status: Patient is awake, alert, oriented x3. Cranial Nerves: II: Pupils equal, round, and reactive to light.   III,IV, VI: EOMI without  ptosis or diploplia.  V: Facial sensation is symmetric to light touch and temperature. VII: Facial movement is symmetric.  VIII: Hearing is intact to voice. XI: Shoulder shrug is symmetric. XII: Distal tongue is laterally displaced to R. Visual Field: L homonymous hemianopsia. Motor: Normal effort throughout with decrease in strength to LUE, now 3-4/5. RUE 5/5, bilateral LE 5/5. Movement of LUE takes more effort and time than RUE. Sensory: Sensation is decreased over LUE and LLE. Cerebellar: LUE pronator drift superiorly, laterally, and medially. Psych:Anxious mood and affect.  Patient Lines/Drains/Airways Status     Active Line/Drains/Airways     Name Placement date Placement time Site Days   Peripheral IV 10/23/21 20 G Anterior;Distal;Left;Upper Arm 10/23/21  --  Arm  1             ASSESSMENT/PLAN:  Assessment: Principal Problem:   Suspected cerebrovascular accident (CVA)   Plan: #R PCA Infarct Patient presented with L-sided numbness and was found to have R PCA infarct affecting dorsal thalamus, medial R temporal, and R occipital regions of the brain. His current symptoms of L homonymous hemianopsia, L sided sensory changes/deficits, pronator drift coincide with these affected regions. Concerning this morning however is new decreased strength particularly to the LUE, now 3-4/5 from 5/5 with patient describing the limb as feeling heavy and weak as a result with decreased use of the limb. His visual field deficit is more prominent this morning as he compensated during initial exam 06/09 where he no  longer is able to do that. He has LUE pronator drift. Mental status is unchanged. HbA1c 5.8%, LDL 52, triglycerides 272.   STAT CT head due to concern for progression of stroke to new territory (MCA) +/-  hemorrhagic transformation or widening of penumbra of PCA infarct ordered which showed known acute infarction in the right PCA distribution and possibly more confluent involvement in  the right occipital cortex than on the preceding brain MRI, without hemorrhagic conversion or new territory distribution. -Neurology follow, appreciate their assistance  -F/u recommendations given CT findings above.  -F/u echo -F/u PT, OT recommendations -Start atorvastatin 80 mg daily -Lifestyle modifications for pre-diabetes and hypertriglyceridemia   #Polycythemia Hgb on admission elevated to 17.7, increased to 18.2 overnight. Most recent labs that I am able to see in [place] in 2018 showed normal Hgb. Polycythemia vera can be diagnosed later in life, age 44-65, so he is outside of this typical age range but he does not follow regularly with PCP so it is unknown how long he has had elevated hemoglobin, This would definitely increase is risk for CVA if he has had this complication for some time and it has not been evaluated further due to lack of PCP. -Check EPO level -Trend CBC  #Rosacea Several skin exam findings resembling rosacea including rhinophyma, telangiectasias, hyperpigmented skin over nasal bridge and cheeks, as well as over the brows. I have not been able to find information on if rosacea can lead to polycythemia, just a few case reports. -Can give soothing lotions if patient reports skin discomfort  #Poison oak Patient has scabbed poison oak lesions over his R forearm that have become itchy again. -Itch cream PRN  Best Practice: Diet: Cardiac diet IVF: None VTE: SCDs Start: 10/23/21 1545 Code: Full AB: None Therapy Recs: Pending DISPO: Pending.  Signature: Farrel Gordon, D.O.  Internal Medicine Resident, PGY-1 [facility]  Pager: [phone] 9:33 AM, 10/24/2021   Please contact the on call pager after 5 pm and on weekends at [phone].

## 2021-10-24 NOTE — Evaluation (Signed)
Occupational Therapy Evaluation Patient Details Name: Roberto [name] MRN: [id number] DOB: 30-Apr-1951 Today's Date: 10/24/2021   History of Present Illness 71 yo male admitted with L side facial and extremity numbness starting 6/7 MRI(+) multifocal acute ischemia R PCA territory involving dorsal thalmas, medial R temporal lobe, R occipial lobe PMH CVA 2017 glaucoma, HTN   Clinical Impression   PT admitted with R CVA. Pt currently with functional limitiations due to the deficits listed below (see OT problem list). Pt currently with L inattention, L UE deficits, visual deficits and cognition deficits affecting all adls. Pt very flat affect at this time. Pt will benefit from skilled OT to increase their independence and safety with adls and balance to allow discharge CIR.       Recommendations for follow up therapy are one component of a multi-disciplinary discharge planning process, led by the attending physician.  Recommendations may be updated based on patient status, additional functional criteria and insurance authorization.   Follow Up Recommendations  Acute inpatient rehab (3hours/day)    Assistance Recommended at Discharge    Patient can return home with the following Two people to help with walking and/or transfers;A lot of help with bathing/dressing/bathroom;Assist for transportation    Functional Status Assessment  Patient has had a recent decline in their functional status and demonstrates the ability to make significant improvements in function in a reasonable and predictable amount of time.  Equipment Recommendations  BSC/3in1    Recommendations for Other Services Rehab consult     Precautions / Restrictions Precautions Precautions: Fall      Mobility Bed Mobility Overal bed mobility: Needs Assistance Bed Mobility: Supine to Sit     Supine to sit: Min guard     General bed mobility comments: Mod cues for L UE. pt using R UE to place L UE on abdomen and able to roll  to L side. pt able to static sit min guard (A). pt needs cues to place L LE on the floor. pt states I cant feel it    Transfers Overall transfer level: Needs assistance Equipment used: None Transfers: Sit to/from Stand Sit to Stand: Min assist           General transfer comment: pt needs mod A to place L LE on the floor in good positioning prior to stand. OT with hand placement for pressure on L LE with sit<>Stand. pt reaching with R UE toward chair and transfering. pt was able to advance LLE      Balance Overall balance assessment: Needs assistance Sitting-balance support: Single extremity supported, No upper extremity supported, Feet supported Sitting balance-Leahy Scale: Fair     Standing balance support: Single extremity supported, During functional activity Standing balance-Leahy Scale: Poor                             ADL either performed or assessed with clinical judgement   ADL Overall ADL's : Needs assistance/impaired Eating/Feeding: Minimal assistance;Bed level Eating/Feeding Details (indicate cue type and reason): eating R side of plate                     Toilet Transfer: Minimal assistance;Stand-pivot             General ADL Comments: pt with decreased awareness to the L side of body and needs cues to attend to L UE. pt progressing from bed to chair this session with max cues  Vision Baseline Vision/History: 3 Glaucoma Vision Assessment?: Vision impaired- to be further tested in functional context Additional Comments: L homonymous hemianopsia     Perception     Praxis      Pertinent Vitals/Pain Pain Assessment Pain Assessment: Faces Faces Pain Scale: Hurts a little bit Pain Location: L side Pain Descriptors / Indicators: Pins and needles Pain Intervention(s): Monitored during session, Repositioned     Hand Dominance Left   Extremity/Trunk Assessment Upper Extremity Assessment Upper Extremity Assessment: LUE  deficits/detail LUE Deficits / Details: inattention to L UE, decreased sensation. AROM elbow flexion/ extension, AAROM shoulder flexion, LUE Sensation: decreased light touch;decreased proprioception LUE Coordination: decreased fine motor;decreased gross motor   Lower Extremity Assessment Lower Extremity Assessment: Defer to PT evaluation LLE Deficits / Details: incoordination, also antalgic appearing due to "pins and needles" sensation. General weakness LLE Sensation: decreased light touch LLE Coordination: decreased fine motor   Cervical / Trunk Assessment Cervical / Trunk Assessment: Normal   Communication Communication Communication: No difficulties   Cognition Arousal/Alertness: Awake/alert Behavior During Therapy: WFL for tasks assessed/performed, Flat affect Overall Cognitive Status: Impaired/Different from baseline (NT formally, focused/sustained attension) Area of Impairment: Safety/judgement                         Safety/Judgement: Decreased awareness of deficits     General Comments: very flat affect. pt giving simple responses     General Comments  VSS    Exercises     Shoulder Instructions      Home Living Family/patient expects to be discharged to:: Private residence Living Arrangements: Spouse/significant other Available Help at Discharge: Available 24 hours/day;Family Type of Home: House Home Access: Stairs to enter [employer] of Steps: seveeral Entrance Stairs-Rails: Left;Right Home Layout: One level     Bathroom Shower/Tub: Chief Strategy Officer: Standard Bathroom Accessibility: Yes   Home Equipment: Cane - single point;Shower seat (wife has w/c)   Additional Comments: girlfriend present in room      Prior Functioning/Environment Prior Level of Function : Independent/Modified Independent;Working/employed;Driving             Mobility Comments: pushed heavy tool cart around long distances.           OT Problem List: Decreased activity tolerance;Impaired balance (sitting and/or standing);Impaired vision/perception;Decreased coordination;Decreased cognition;Decreased safety awareness;Decreased knowledge of use of DME or AE;Decreased knowledge of precautions;Impaired UE functional use      OT Treatment/Interventions: Self-care/ADL training;Therapeutic exercise;Neuromuscular education;DME and/or AE instruction;Manual therapy;Modalities;Therapeutic activities;Cognitive remediation/compensation;Visual/perceptual remediation/compensation;Patient/family education;Balance training    OT Goals(Current goals can be found in the care plan section) Acute Rehab OT Goals Patient Stated Goal: to be able to walk OT Goal Formulation: With patient/family Time For Goal Achievement: 11/07/21 Potential to Achieve Goals: Good  OT Frequency: Min 2X/week    Co-evaluation              AM-PAC OT "6 Clicks" Daily Activity     Outcome Measure Help from another person eating meals?: A Little Help from another person taking care of personal grooming?: A Little Help from another person toileting, which includes using toliet, bedpan, or urinal?: A Lot Help from another person bathing (including washing, rinsing, drying)?: A Lot Help from another person to put on and taking off regular upper body clothing?: A Little Help from another person to put on and taking off regular lower body clothing?: A Lot 6 Click Score: 15   End of Session Equipment Utilized During  Treatment: Gait belt Nurse Communication: Mobility status;Precautions  Activity Tolerance: Patient tolerated treatment well Patient left: in chair;with call bell/phone within reach;with chair alarm set  OT Visit Diagnosis: Unsteadiness on feet (R26.81);Muscle weakness (generalized) (M62.81)                Time: [id number] OT Time Calculation (min): 18 min Charges:  OT General Charges $OT Visit: 1 Visit OT Evaluation $OT Eval Moderate Complexity:  1 Mod   Brynn, OTR/L  Acute Rehabilitation Services Office: [phone] .   Mateo FlowBrynn Tones 10/24/2021, 1:36 PM

## 2021-10-24 NOTE — Evaluation (Signed)
Physical Therapy Evaluation Patient Details Name: Roberto [name] MRN: [id number] DOB: 23-Dec-1950 Today's Date: 10/24/2021  History of Present Illness  71 yo male admitted with L side facial and extremity numbness starting 6/7 MRI(+) multifocal acute ischemia R PCA territory involving dorsal thalmas, medial R temporal lobe, R occipial lobe PMH CVA 2017 glaucoma, HTN  Clinical Impression  Pt admitted with/for L sided numbness and weakness found to be due to a R PCA territory stroke.  Once up, pt needing from min to max assist for static/dynamic standing tasks to gait.  Pt currently limited functionally due to the problems listed. ( See problems list.)   Pt will benefit from PT to maximize function and safety in order to get ready for next venue listed below.        Recommendations for follow up therapy are one component of a multi-disciplinary discharge planning process, led by the attending physician.  Recommendations may be updated based on patient status, additional functional criteria and insurance authorization.  Follow Up Recommendations Acute inpatient rehab (3hours/day)    Assistance Recommended at Discharge Frequent or constant Supervision/Assistance  Patient can return home with the following  A little help with walking and/or transfers;A little help with bathing/dressing/bathroom;Assistance with cooking/housework;Assist for transportation;Help with stairs or ramp for entrance    Equipment Recommendations Other (comment) (TBA, may need AD before home.)  Recommendations for Other Services  Rehab consult    Functional Status Assessment Patient has had a recent decline in their functional status and demonstrates the ability to make significant improvements in function in a reasonable and predictable amount of time.     Precautions / Restrictions Precautions Precautions: Fall      Mobility  Bed Mobility Overal bed mobility: Needs Assistance Bed Mobility: Supine to Sit      Supine to sit: Min guard     General bed mobility comments: initially up via left elbow, which weakly assisted push up to midline sitting with incoordination.  Pt also reached out to foot board with right hand for stability.  Weakly assisted scoot to EOB.    Transfers Overall transfer level: Needs assistance Equipment used: None Transfers: Sit to/from Stand Sit to Stand: Min assist           General transfer comment: cues for positioning EOB, hand placement with assist to position uncoordinated L hand into a fist for pushing.  Pt was assisted forward and with boost.  bil LE thrust posteriorly against the bed frame for stability.    Ambulation/Gait Ambulation/Gait assistance: Max assist Gait Distance (Feet): 8 Feet Assistive device: 1 person hand held assist Gait Pattern/deviations: Step-to pattern   Gait velocity interpretation: <1.31 ft/sec, indicative of household ambulator   General Gait Details: paretic and antalgic gait pattern L LE with significant instability issues, external support not giving pt enough stability.  Stairs            Wheelchair Mobility    Modified Rankin (Stroke Patients Only) Modified Rankin (Stroke Patients Only) Pre-Morbid Rankin Score: No symptoms Modified Rankin: Moderately severe disability     Balance Overall balance assessment: Needs assistance Sitting-balance support: Single extremity supported, No upper extremity supported, Feet supported Sitting balance-Leahy Scale: Fair     Standing balance support: Single extremity supported, During functional activity Standing balance-Leahy Scale: Poor Standing balance comment: external stability through 1 UE/trunk not enough to fully stabilize this pt for gait and dynamic tasks.  Pertinent Vitals/Pain Pain Assessment Pain Assessment: Faces Faces Pain Scale: Hurts a little bit Pain Location: L side Pain Descriptors / Indicators: Pins and  needles Pain Intervention(s): Monitored during session    Home Living Family/patient expects to be discharged to:: Private residence Living Arrangements: Spouse/significant other Available Help at Discharge: Available 24 hours/day;Family Type of Home: House Home Access: Stairs to enter Entrance Stairs-Rails: Chemical engineer of Steps: seveeral   Home Layout: One level Home Equipment: Cane - single point;Shower seat (wife has w/c)      Prior Function Prior Level of Function : Independent/Modified Independent;Working/employed;Driving             Mobility Comments: pushed heavy tool cart around long distances.       Hand Dominance        Extremity/Trunk Assessment   Upper Extremity Assessment Upper Extremity Assessment: Defer to OT evaluation    Lower Extremity Assessment Lower Extremity Assessment: LLE deficits/detail LLE Deficits / Details: incoordination, also antalgic appearing due to "pins and needles" sensation. General weakness LLE Sensation: decreased light touch LLE Coordination: decreased fine motor    Cervical / Trunk Assessment Cervical / Trunk Assessment: Normal  Communication   Communication: No difficulties  Cognition Arousal/Alertness: Awake/alert Behavior During Therapy: WFL for tasks assessed/performed, Flat affect Overall Cognitive Status: Impaired/Different from baseline (NT formally, focused/sustained attension)                                          General Comments General comments (skin integrity, edema, etc.): vss    Exercises     Assessment/Plan    PT Assessment Patient needs continued PT services  PT Problem List Decreased strength;Decreased activity tolerance;Decreased balance;Decreased mobility;Decreased knowledge of use of DME;Impaired sensation;Pain       PT Treatment Interventions DME instruction;Gait training;Functional mobility training;Therapeutic activities;Balance  training;Neuromuscular re-education;Patient/family education    PT Goals (Current goals can be found in the Care Plan section)  Acute Rehab PT Goals Patient Stated Goal: to ultimately go back to work, PLOF. PT Goal Formulation: With patient Time For Goal Achievement: 11/07/21 Potential to Achieve Goals: Good    Frequency Min 4X/week     Co-evaluation               AM-PAC PT "6 Clicks" Mobility  Outcome Measure Help needed turning from your back to your side while in a flat bed without using bedrails?: A Little Help needed moving from lying on your back to sitting on the side of a flat bed without using bedrails?: A Little Help needed moving to and from a bed to a chair (including a wheelchair)?: A Lot Help needed standing up from a chair using your arms (e.g., wheelchair or bedside chair)?: A Little Help needed to walk in hospital room?: A Lot Help needed climbing 3-5 steps with a railing? : A Lot 6 Click Score: 15    End of Session   Activity Tolerance: Other (comment);Patient limited by pain;Patient tolerated treatment well (pins and needle pain) Patient left: in bed;with call bell/phone within reach;with family/visitor present;Other (comment) (session interrupted by neuro team, left pt with them at EOB)   PT Visit Diagnosis: Unsteadiness on feet (R26.81);Other abnormalities of gait and mobility (R26.89);Difficulty in walking, not elsewhere classified (R26.2);Other symptoms and signs involving the nervous system (R29.898)    Time: [id number] PT Time Calculation (min) (ACUTE ONLY): 23 min   Charges:  PT Evaluation $PT Eval Moderate Complexity: 1 Mod PT Treatments $Gait Training: 8-22 mins        10/24/2021  Ginger Carne., PT Acute Rehabilitation Services [phone]  (pager) [phone]  (office)  Tessie Fass Kingson Lohmeyer 10/24/2021, 11:44 AM

## 2021-10-24 NOTE — Evaluation (Signed)
Clinical/Bedside Swallow Evaluation Patient Details  Name: Kymarion Barkley MRN: [id number] Date of Birth: July 29, 1950  Today's Date: 10/24/2021 Time: SLP Start Time (ACUTE ONLY): 1140 SLP Stop Time (ACUTE ONLY): 1203 SLP Time Calculation (min) (ACUTE ONLY): 23 min  Past Medical History:  Past Medical History:  Diagnosis Date   Anemia    Glaucoma    Hypertension    Past Surgical History:  Past Surgical History:  Procedure Laterality Date   HERNIA REPAIR     HPI:  Davyd Garrido is a 72 y.o. M with pertinent PMH of CVA who presents with L sided facial and extremity numbness for two days. He states that he began feeling like something was 'off' a few days ago, and that yesterday he fell when walking back into his house from the mail box. When he fell he realized he couldn't walked and leaned on his truck to help get back inside. At that time he went to bed and when he woke this morning had L sided numbness and some arm pain; 10/23/21 MRI head revealed Multifocal acute ischemia within the right PCA territory, involving  the dorsal thalamus, medial right temporal lobe and the right  occipital lobe. No hemorrhage or mass effect; BSE generated.    Assessment / Plan / Recommendation  Clinical Impression  Pt seen for clinical swallowing evaluation with slight L sensation loss with oral movements/ROM and slight oral (labial) residue noted with min cues provided by SLP for oral clearance required d/t decreased awareness/min impulsivity with bite size.  Decreased awareness noted min overall on L (with consuming foods on L side of tray) d/t L neglect.  Flat affect during conversational interactions observed with short answers vs detailed information provided by pt. Pt did not have residue in buccal cavity on L post intake, but this is a possiblity with increased portion size and/or various consistencies increased during PO intake, so pt educated re: lingual sweep to L and intermittent clearance of L labial residue.   Recommend general swallowing precautions paired with L lingual sweep/labial clearance during meals with Regular/thin liquid diet; SLE (Speech/language cognitive) assessment may be warranted for cognitive changes.  Thank you for this consultation. SLP Visit Diagnosis: Dysphagia, unspecified (R13.10)    Aspiration Risk  Mild aspiration risk    Diet Recommendation   Regular/thin liquids   Medication Administration: Whole meds with liquid (as tolerated and/or whole/puree)    Other  Recommendations Oral Care Recommendations: Oral care BID;Staff/trained caregiver to provide oral care    Recommendations for follow up therapy are one component of a multi-disciplinary discharge planning process, led by the attending physician.  Recommendations may be updated based on patient status, additional functional criteria and insurance authorization.  Follow up Recommendations Acute inpatient rehab (3hours/day)      Assistance Recommended at Discharge Frequent or constant Supervision/Assistance  Functional Status Assessment Patient has had a recent decline in their functional status and demonstrates the ability to make significant improvements in function in a reasonable and predictable amount of time.  Frequency and Duration min 2x/week  1 week       Prognosis Prognosis for Safe Diet Advancement: Good      Swallow Study   General Date of Onset: 10/23/21 HPI: Nikitas Seepersad is a 71 y.o. M with pertinent PMH of CVA who presents with L sided facial and extremity numbness for two days. He states that he began feeling like something was 'off' a few days ago, and that yesterday he fell when walking back into  his house from the mail box. When he fell he realized he couldn't walked and leaned on his truck to help get back inside. At that time he went to bed and when he woke this morning had L sided numbness and some arm pain; 10/23/21 MRI head revealed Multifocal acute ischemia within the right PCA territory,  involving  the dorsal thalamus, medial right temporal lobe and the right  occipital lobe. No hemorrhage or mass effect; BSE generated. Type of Study: Bedside Swallow Evaluation Previous Swallow Assessment: [facility] passed 10/23/21 Diet Prior to this Study: Regular;Thin liquids Temperature Spikes Noted: No Respiratory Status: Room air History of Recent Intubation: No Behavior/Cognition: Alert;Cooperative (somewhat flat affect) Oral Cavity Assessment: Within Functional Limits Oral Care Completed by SLP: No (Pt eating lunch tray when SLP entered room) Oral Cavity - Dentition: Missing dentition;Poor condition Vision: Functional for self-feeding Self-Feeding Abilities: Able to feed self Patient Positioning: Upright in bed Baseline Vocal Quality: Normal Volitional Cough: Strong Volitional Swallow: Able to elicit    Oral/Motor/Sensory Function Overall Oral Motor/Sensory Function: Mild impairment Facial ROM: Reduced left;Other (Comment) Facial Symmetry: Abnormal symmetry left;Other (Comment) (slight) Facial Strength: Within Functional Limits Facial Sensation: Reduced left Lingual ROM: Reduced left Lingual Symmetry: Abnormal symmetry left Lingual Strength: Within Functional Limits Lingual Sensation: Within Functional Limits   Ice Chips Ice chips: Not tested   Thin Liquid Thin Liquid: Within functional limits Presentation: Self Fed;Straw    Nectar Thick Nectar Thick Liquid: Not tested   Honey Thick Honey Thick Liquid: Not tested   Puree Puree: Within functional limits Presentation: Self Fed   Solid     Solid: Impaired Presentation: Self Fed Oral Phase Impairments: Other (comment) (reduced sensation on L; residue on L labial area) Oral Phase Functional Implications: Left anterior spillage      Elvina Sidle, M.S., CCC-SLP 10/24/2021,12:38 PM

## 2021-10-24 NOTE — Progress Notes (Signed)
[place] New Study Start: OCEANIC-STROKE   SUMMARY For more information refer to: [email or web address]. Study Identifier: [id number]    A Multicenter, International, Randomized, Placebo Controlled, Double-blind, Parallel Group and Event Driven Phase 3 Study of the Oral FXIa Inhibitor Asundexian ([place] [id number]) for the Prevention of Ischemic Stroke in Male and Male Participants Aged 71 Years and Older After an Acute Non-cardioembolic Ischemic Stroke or High-risk TIA  Brief Summary This study will randomize adult patients with an initial diagnosis of acute non-cardioembolic ischemic stroke or high-risk transient ischemic attack (TIA) to treatment within 72 hours of symptom onset and with the intention to be treated with antiplatelet therapy.  Design Phase 3, Randomized, Interventional, Event-Driven  Intervention Asundexian [id number]) 50 mg tablets or matching placebo tablets (pink, oval, film-coated tablets  Concomitant Therapy Participants are to receive antiplatelet therapy (single or dual; provider discretion) during the study conduct.  Prohibited Therapy Oral anticoagulation Full dose and/or long-term anticoagulation therapy with heparin or LMWH  Chronic (more than 4 weeks continuous) therapy with NSAIDs during the study conduct Concomitant use of combined P-gp and strong/moderate CYP3A4 inducers Concomitant use of combined P-gp and strong CYP3A4 inhibitors Herbal/traditional medications and/or supplements with known anticoagulant/antiplatelet effects  Anticoagulation Prophylaxis Venous thromboembolism prophylaxis with LMWH or unfractionated heparin for short periods of time (2 weeks) is allowed.  Potential Drug-Drug Interactions Rosuvastatin 20 mg daily or higher or atorvastatin 80 mg (additional monitoring may be warranted due to increased concentrations of rosuvastatin and atorvastatin - asundexian [id number]) is a weak inhibitor of CYP2C8)  Administration   Can be taken irrespective of food intake. Should be swallowed whole with water, preferably in the morning. CANNOT be crushed or broken.      Plan: Start Cecille Rubin [phone]) 50 mg tablets or placebo] this evening. Study medication must be picked up from pharmacy, medication can not be tubed.    Please contact IDS if any questions or concerns regarding the study medication.    Acey Lav, PharmD, [place] Pharmacist  [phone]

## 2021-10-25 DIAGNOSIS — I63431 Cerebral infarction due to embolism of right posterior cerebral artery: Secondary | ICD-10-CM

## 2021-10-25 LAB — CBC
HCT: 50.8 % (ref 39.0–52.0)
Hemoglobin: 17.1 g/dL — ABNORMAL HIGH (ref 13.0–17.0)
MCH: 31.4 pg (ref 26.0–34.0)
MCHC: 33.7 g/dL (ref 30.0–36.0)
MCV: 93.2 fL (ref 80.0–100.0)
Platelets: 198 10*3/uL (ref 150–400)
RBC: 5.45 MIL/uL (ref 4.22–5.81)
RDW: 12.8 % (ref 11.5–15.5)
WBC: 11.5 10*3/uL — ABNORMAL HIGH (ref 4.0–10.5)
nRBC: 0 % (ref 0.0–0.2)

## 2021-10-25 LAB — BASIC METABOLIC PANEL
Anion gap: 8 (ref 5–15)
BUN: 14 mg/dL (ref 8–23)
CO2: 24 mmol/L (ref 22–32)
Calcium: 8.4 mg/dL — ABNORMAL LOW (ref 8.9–10.3)
Chloride: 101 mmol/L (ref 98–111)
Creatinine, Ser: 0.82 mg/dL (ref 0.61–1.24)
GFR, Estimated: >60 mL/min (ref >60)
Glucose, Bld: 111 mg/dL — ABNORMAL HIGH (ref 70–99)
Potassium: 4 mmol/L (ref 3.5–5.1)
Sodium: 133 mmol/L — ABNORMAL LOW (ref 135–145)

## 2021-10-25 MED ORDER — ENOXAPARIN SODIUM 40 MG/0.4ML IJ SOSY
40.0000 mg | PREFILLED_SYRINGE | INTRAMUSCULAR | Status: DC
Start: 2021-10-25 — End: 2021-10-26
  Administered 2021-10-25: 40 mg via SUBCUTANEOUS
  Filled 2021-10-25: qty 0.4

## 2021-10-25 MED ORDER — LOSARTAN POTASSIUM 50 MG PO TABS
50.0000 mg | ORAL_TABLET | Freq: Every day | ORAL | Status: DC
  Administered 2021-10-25 – 2021-10-26 (×2): 50 mg via ORAL
  Filled 2021-10-25 (×2): qty 1

## 2021-10-25 MED ORDER — ENOXAPARIN SODIUM 40 MG/0.4ML IJ SOSY: 40.0000 mg | PREFILLED_SYRINGE | INTRAMUSCULAR | Status: DC

## 2021-10-25 NOTE — Progress Notes (Signed)
Patient sat at bedside to bath; patient became dizzy per patient; dependent of bath; was able to stand briefly at bedside; returned to bed without acute distress; felt better laying back down.

## 2021-10-25 NOTE — Progress Notes (Addendum)
NAME:  Roberto [name], MRN:  [id number], DOB:  07/24/1950, LOS: 1 ADMISSION DATE:  10/23/2021  Subjective  Patient evaluated at bedside this AM. Reports no acute issues this morning. Does mention continued facial and body numbness, no change from yesterday. Discussed plan for today and eventual discharge planning.  Objective   Blood pressure (!) 173/95, pulse 82, temperature 98.7 F (37.1 C), temperature source Oral, resp. rate 16, weight 77.1 kg, SpO2 96 %.     Intake/Output Summary (Last 24 hours) at 10/25/2021 1102 Last data filed at 10/25/2021 0522 Gross per 24 hour  Intake 1430.54 ml  Output 1050 ml  Net 380.54 ml   Filed Weights   10/23/21 1200 10/24/21 0316  Weight: 81.6 kg 77.1 kg   Physical Exam: General: Resting comfortably in no acute distress CV: Regular rate, rhythm. No murmurs appreciated. Warm extremities Pulm: Normal respiratory effort on room air. Clear to ausculation bilaterally. Neuro: Awake, alert, fully oriented. Pupils equal, round, reactive. L homonymous hemianopsia present. Otherwise cranial nerves in tact. LUE 4/5, otherwise motor groups 5/5. Sensation decreased on left upper and lower extremities. Finger to nose normal.  Labs       Latest Ref Rng & Units 10/25/2021    4:24 AM 10/24/2021    3:18 AM 10/23/2021   12:49 PM  CBC  WBC 4.0 - 10.5 K/uL 11.5  8.3    Hemoglobin 13.0 - 17.0 g/dL 17.1  18.2  17.7   Hematocrit 39.0 - 52.0 % 50.8  52.1  52.0   Platelets 150 - 400 K/uL 198  244        Latest Ref Rng & Units 10/25/2021    4:24 AM 10/24/2021    3:18 AM 10/23/2021   12:49 PM  BMP  Glucose 70 - 99 mg/dL 111  134  104   BUN 8 - 23 mg/dL 14  15  13    Creatinine 0.61 - 1.24 mg/dL 0.82  0.90  0.90   Sodium 135 - 145 mmol/L 133  132  132   Potassium 3.5 - 5.1 mmol/L 4.0  3.8  3.9   Chloride 98 - 111 mmol/L 101  100  101   CO2 22 - 32 mmol/L 24  23    Calcium 8.9 - 10.3 mg/dL 8.4  8.7      Summary   Roberto [name] is 71yo person with previous CVA in 2017  admitted 6/9 with R PCA infarct, thought to be thrombotic in nature, now pending CIR placement.   Assessment & Plan:  Principal Problem:   CVA (cerebral vascular accident) [facility]) Active Problems:   Essential hypertension  #Acute R PCA cerebral infarct R/p [facility] yesterday reviewed by neurology, no concerning features for hemorrhagic conversion. This morning neuro exam largely unchanged. Echo yesterday unremarkable. Discussed the patient and his wife the need for risk factor modification. Did have previous CVA - likely had another given was not on appropriate medications. In addition, he is being given investigational drug for randomized trial. Planning for inpatient rehabilitation once available. - Appreciate neuro's recommendations - Continue DAPT x3wk, followed by Plavix daily - Continue high dose statin - Lifestyle modifications - PT/OT  #Hypertension Patient has been hypertensive since arrival to hospital. Discussed with neurology, out of permissive hypertension window.  - Start losartan 50mg  daily - SBP goal 120-180  - Daily BMP  #Polycythemia Unclear origin, no history of tobacco use or underlying lung disease. Per chart review was previously normal. No obvious inducing medication. Will need outpatient  follow-up, possibly work-up for a hyperviscosity syndrome if remains elevated.  - Daily CBC - Follow-up with PCP  Best practice:   DIET: HH IVF: n/a DVT PPX: lovenox BOWEL: Miralax CODE: FULL FAM COM: Wife at bedside this morning  Sanjuan Dame, MD Internal Medicine Resident PGY-2 PAGER: [phone] 10/25/2021 11:02 AM  If after hours (below), please contact on-call pager: [phone] 5PM-7AM Monday-Friday 1PM-7AM Saturday-Sunday

## 2021-10-25 NOTE — Progress Notes (Signed)
Stroke Team Progress Note  SUBJECTIVE Patient is sitting up in bed with his girlfriend Roberto [name] at the bedside.  He continues to have increased left upper extremity weakness though subjective numbness appears to be somewhat better.  Continues to have peripheral vision loss.  He did sign consent and is participating in the [facility] stroke prevention study echocardiogram showed ejection fraction of 60 to 65% without obvious cardiac source of embolism.  OBJECTIVE Most recent Vital Signs: Temp: 98.7 F (37.1 C) (06/11 0800) Temp Source: Oral (06/11 0800) BP: 173/95 (06/11 0800) Pulse Rate: 82 (06/11 0800) Respiratory Rate: 16 O2 Saturdation: 96%  CBG (last 3)  Recent Labs    10/23/21 1239  GLUCAP 105*       Studies:  CT head 10/24/2021 expected evolutionary changes in the right PCA infarct no hemorrhagic conversion or acute change. MRI brain without contrast multifocal ischemic changes in the right PCA territory involving dorsal thalamus, medial right temporal lobe and right occipital lobe.  No hemorrhage or mass effect.   CTA evaluation is limited by poor bolus timing of venous contamination.  Approximate 75% stenosis in proximal left ICA.  Evaluation of distal intracranial vasculature is severely limited. CT perfusion no core infarct but decreased perfusion in bilateral cerebellar and cerebral hemispheres likely artifactual. ECHO pending  LDL 52 mg percent HbA1c 5.8 Plan for loop recorder at discharge to look for paroxysmal A-fib Physical Exam:    [name] not in distress. . Afebrile. Head is nontraumatic. Neck is supple without bruit.    Cardiac exam no murmur or gallop. Lungs are clear to auscultation. Distal pulses are well felt.  Neurological Exam ; Awake alert oriented to time place and person.  Speech and language appear normal.  Extraocular movements are full range without nystagmus.  Dense left homonymous hemianopsia.  Symmetric facial movements without  weakness.  Subjective left face sensory loss in the lower portion.  Palatal movements are normal.  Tongue midline.  Motor system exam shows significant upper extremity drift with grip weakness and 2-3/5 strength..  Left lower extremity drift with 4/5 strength.  Mild weakness of left hip flexors 4+ out of 5.  Diminished left hemibody touch pinprick sensation.  Reflexes are symmetric.  Plantars are downgoing.  Gait not tested  NIH stroke scale 5. 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 He presented outside time window for thrombolysis and CT angiogram did not show any LVO.  Vascular risk factors of hypertension, hyperlipidemia, extracranial ICA stenosis.    Hospital day # 1  TREATMENT/PLAN Patient has shown  some neurological worsening t yesterday .  Cautious mobilization out of bed if tolerated.  Continue dual antiplatelet therapy of aspirin and Plavix for 3 weeks followed by Plavix alone and aggressive risk factor modification.  Mobilize out of bed.  Therapy consults.  Patient is participating   in the [facility] stroke prevention trial  (standard antiplatelet therapy plus factor XI inhibitor Asundexian  versus standard medical therapy plus placebo ).  He he has been randomized and will go home with the study medication provided by pharmacy at discharge.  He will need loop recorder insertion at discharge to look for paroxysmal A-fib.  Discussed with patient, his girlfriend and answered questions greater than 50% time during this 35 minute visit was spent on counseling and coordination of care about his PCA infarct and discussion about evaluation and  treatment plan and answering questions Roberto Heady, MD Medical Director [facility] Pager: [phone] 10/25/2021 11:21 AM

## 2021-10-26 ENCOUNTER — Inpatient Hospital Stay (HOSPITAL_COMMUNITY): Payer: PRIVATE HEALTH INSURANCE

## 2021-10-26 DIAGNOSIS — I63431 Cerebral infarction due to embolism of right posterior cerebral artery: Secondary | ICD-10-CM | POA: Diagnosis not present

## 2021-10-26 LAB — BASIC METABOLIC PANEL
Anion gap: 9 (ref 5–15)
BUN: 14 mg/dL (ref 8–23)
CO2: 20 mmol/L — ABNORMAL LOW (ref 22–32)
Calcium: 8.4 mg/dL — ABNORMAL LOW (ref 8.9–10.3)
Chloride: 102 mmol/L (ref 98–111)
Creatinine, Ser: 0.99 mg/dL (ref 0.61–1.24)
GFR, Estimated: >60 mL/min (ref >60)
Glucose, Bld: 106 mg/dL — ABNORMAL HIGH (ref 70–99)
Potassium: 4.1 mmol/L (ref 3.5–5.1)
Sodium: 131 mmol/L — ABNORMAL LOW (ref 135–145)

## 2021-10-26 LAB — CBC
HCT: 51.2 % (ref 39.0–52.0)
Hemoglobin: 17.5 g/dL — ABNORMAL HIGH (ref 13.0–17.0)
MCH: 32.1 pg (ref 26.0–34.0)
MCHC: 34.2 g/dL (ref 30.0–36.0)
MCV: 93.9 fL (ref 80.0–100.0)
Platelets: 187 10*3/uL (ref 150–400)
RBC: 5.45 MIL/uL (ref 4.22–5.81)
RDW: 12.7 % (ref 11.5–15.5)
WBC: 10.1 10*3/uL (ref 4.0–10.5)
nRBC: 0 % (ref 0.0–0.2)

## 2021-10-26 LAB — GLUCOSE, CAPILLARY: Glucose-Capillary: 139 mg/dL — ABNORMAL HIGH (ref 70–99)

## 2021-10-26 LAB — ERYTHROPOIETIN: Erythropoietin: 7.2 m[IU]/mL (ref 2.6–18.5)

## 2021-10-26 MED ORDER — LACTATED RINGERS IV BOLUS
500.0000 mL | Freq: Once | INTRAVENOUS | Status: DC
Start: 2021-10-27 — End: 2021-10-26

## 2021-10-26 MED ORDER — LACTATED RINGERS IV BOLUS: 1000.0000 mL | Freq: Once | INTRAVENOUS | Status: DC

## 2021-10-26 MED ORDER — SODIUM CHLORIDE 0.9 % IV BOLUS
500.0000 mL | Freq: Once | INTRAVENOUS | Status: AC
  Administered 2021-10-26: 500 mL via INTRAVENOUS

## 2021-10-26 MED ORDER — IOHEXOL 350 MG/ML SOLN
75.0000 mL | Freq: Once | INTRAVENOUS | Status: AC | PRN
  Administered 2021-10-26: 75 mL via INTRAVENOUS

## 2021-10-26 MED ORDER — SENNOSIDES-DOCUSATE SODIUM 8.6-50 MG PO TABS
1.0000 | ORAL_TABLET | Freq: Two times a day (BID) | ORAL | Status: DC
  Administered 2021-10-26 – 2021-10-27 (×4): 1 via ORAL
  Filled 2021-10-26 (×5): qty 1

## 2021-10-26 NOTE — Progress Notes (Signed)
Inpatient Rehabilitation Admissions Coordinator   I met with patient and his girlfriend, Vaughan Basta, at bedside. We discussed goals and expectations of a possible CIR admit. They are in agreement. I await updated therapy notes today and then can begin insurance Auth for possible CIR admit. Acute team made aware.  Danne Baxter, RN, MSN Rehab Admissions Coordinator [phone] 10/26/2021 11:36 AM

## 2021-10-26 NOTE — Progress Notes (Signed)
Pt found unresponsive at 1930 at shift change, not responding to calls with right sided gazing, BP read 209/190, CBG 139, Rapid respond RN called, on call Doc also paged, by the time the Rapid RN got to room, pt became responsive again but with left arm more weak and flaccid, Code stroke was later called at [place], pt taken down to CT where CT and CTA was done accordingly, pt brought back to room thereafter, pt and family at bedside reassured, will however continue to monitor. Obasogie-Asidi, Sakai Heinle Efe

## 2021-10-26 NOTE — Progress Notes (Signed)
   HD#2 SUBJECTIVE:  Patient Summary: Roberto [name] is a 71 y.o. with a pertinent PMH of CVA in 2017, who presented with L sided numbness and admitted for R PCA infarct.   Overnight Events: None  Interim History: Patient reports that he feels unchanged today and has no complaints. He seemed either fatigued or like he is feeling down today.  OBJECTIVE:  Vital Signs: Vitals:   10/25/21 2353 10/26/21 0400 10/26/21 0741 10/26/21 1228  BP: (!) 161/95 139/74 (!) 146/83 139/82  Pulse: 78 80 78 96  Resp: 16 18 16 18   Temp: 98 F (36.7 C) 98.6 F (37 C) 97.7 F (36.5 C) 99.1 F (37.3 C)  TempSrc:  Oral Oral Oral  SpO2: 94% 94% 94% 93%  Weight:       Supplemental O2: Room Air SpO2: 93 %  Filed Weights   10/23/21 1200 10/24/21 0316  Weight: 81.6 kg 77.1 kg     Intake/Output Summary (Last 24 hours) at 10/26/2021 1319 Last data filed at 10/26/2021 1040 Gross per 24 hour  Intake 120 ml  Output 1100 ml  Net -980 ml   Net IO Since Admission: -959.46 mL [10/26/21 1319]  Physical Exam: Constitutional:No acute distress. Cardio:Regular rate and rhythm. No murmurs, rubs, gallops. Pulm:Normal work of breathing on room air. Neuro: Mental Status: Patient is awake, alert, oriented x3 Neglect of LUE present. Cranial Nerves: V: Facial sensation is symmetric to light touch and temperature. VII: Facial movement is symmetric.  VIII: Hearing is intact to voice Motor: RUE and RLE 5/5; LUE 4/5, LLE 5/5 Sensory: Sensation is decreased over RUE and RLE with numbness over L lower aspect of face. Psych:Normal mood with flat affect.  Patient Lines/Drains/Airways Status     Active Line/Drains/Airways     Name Placement date Placement time Site Days   Peripheral IV 10/23/21 20 G Anterior;Distal;Left;Upper Arm 10/23/21  --  Arm  3   Peripheral IV 10/23/21 Right Antecubital 10/23/21  1938  Antecubital  3             ASSESSMENT/PLAN:  Assessment: Principal Problem:   CVA (cerebral  vascular accident) ([place]) Active Problems:   Essential hypertension   Plan: #R PCA Infarct Neurological exam has remained stable, as detailed above. At this time PT/OT recommendation is for CIR and initiation of admission process is pending updated evaluation for today. -Neurology following, appreciate their assistance             -Now enrolled in clinical trial -SCDs only for VTE prophylaxis -Continue atorvastatin 80 mg daily -Continue ASA 81 mg daily x 3 weeks -Continue Plavix 75 mg daily -Lifestyle modifications for pre-diabetes and hypertriglyceridemia    #Polycythemia Hgb stable at this time. Consider outpatient work-up for hyperviscosity syndrome if Hgb remains elevated. -F/u EPO level -Trend CBC   #Hypertension Losartan 50 mg daily initiated 06/11. BP have been improved with SBP 130s-140s. -Continue losartan 50 mg daily   #Poison oak -Itch cream PRN  Best Practice: Diet: Cardiac diet IVF: None VTE: Place and maintain sequential compression device Start: 10/26/21 1121 Code: Full AB: None Therapy Recs: CIR DISPO: Anticipated discharge  1-2 days  to Rehab pending  insurance auth and final recommendations by therapy teams .  Signature: Farrel Gordon, D.O.  Internal Medicine Resident, PGY-1 [facility]  Pager: [phone] 1:19 PM, 10/26/2021   Please contact the on call pager after 5 pm and on weekends at [phone].

## 2021-10-26 NOTE — Progress Notes (Signed)
Pt c/o of nausea at 2320 after coughing, HR observed to be in the 130s, no nausea medication ordered, Doc on call paged and notified, came to bedside to see pt, EKG and CXR done as ordered,, BP read 105/77 at 2321, 500cc NS Bolus ordered and commenced accordingly, pt put back on Tele monitoring,will continue to monitor. Obasogie-Asidi, Avagail Whittlesey Efe

## 2021-10-26 NOTE — Progress Notes (Addendum)
Stroke Team Progress Note Subjective: Doing well he had no questions today.  He is part of a stroke trial  Objective: Vital signs in last 24 hours: Temp:  [97.7 F (36.5 C)-99.1 F (37.3 C)] 99.1 F (37.3 C) (06/12 1228) Pulse Rate:  [78-96] 96 (06/12 1228) Resp:  [16-18] 18 (06/12 1228) BP: (133-162)/(74-95) 139/82 (06/12 1228) SpO2:  [93 %-96 %] 93 % (06/12 1228)    Diet   Diet Heart Room service appropriate? Yes; Fluid consistency: Thin   Intake/Output from previous day: 06/11 0701 - 06/12 0700 In: 360 [P.O.:360] Out: 600 [Urine:600] Intake/Output this shift: Total I/O In: -  Out: 500 [Urine:500]  He is awake and alert.  Speech is normal.  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  Left hemiparesis on exam and left hemibody decreased sensation.   Lab Results: CBG (last 3)  No results for input(s): "GLUCAP" in the last 72 hours.  Studies: ECHOCARDIOGRAM COMPLETE  Result Date: 10/24/2021    ECHOCARDIOGRAM REPORT   Patient Name:   Roberto [name] Date of Exam: 10/24/2021 Medical Rec #:  [id number]   Height:       66.0 in Accession #:    [id number]  Weight:       170.0 lb Date of Birth:  [birth year]   BSA:          1.866 m Patient Age:    71 years    BP:           146/96 mmHg Patient Gender: M           HR:           73 bpm. Exam Location:  Inpatient Procedure: 2D Echo, Cardiac Doppler and Color Doppler Indications:    Stroke  History:        Patient has prior history of Echocardiogram examinations, most                 recent 12/14/2016. Stroke.  Sonographer:    Merrie Roof RDCS Referring Phys: [phone] [place]  1. Left ventricular ejection fraction, by estimation, is 60 to 65%. The left ventricle has normal function. The left ventricle has no regional wall motion abnormalities. Left ventricular diastolic parameters are consistent with Grade I diastolic dysfunction (impaired relaxation).  2. Right ventricular systolic function is normal. The right ventricular size is  normal.  3. The mitral valve is normal in structure. No evidence of mitral valve regurgitation. No evidence of mitral stenosis.  4. The aortic valve is tricuspid. Aortic valve regurgitation is not visualized. Aortic valve sclerosis is present, with no evidence of aortic valve stenosis.  5. The inferior vena cava is normal in size with greater than 50% respiratory variability, suggesting right atrial pressure of 3 mmHg. FINDINGS  Left Ventricle: Left ventricular ejection fraction, by estimation, is 60 to 65%. The left ventricle has normal function. The left ventricle has no regional wall motion abnormalities. The left ventricular internal cavity size was normal in size. There is  no left ventricular hypertrophy. Left ventricular diastolic parameters are consistent with Grade I diastolic dysfunction (impaired relaxation). Right Ventricle: The right ventricular size is normal. Right ventricular systolic function is normal. Left Atrium: Left atrial size was normal in size. Right Atrium: Right atrial size was normal in size. Pericardium: There is no evidence of pericardial effusion. Mitral Valve: The mitral valve is normal in structure. No evidence of mitral valve regurgitation. No evidence of mitral valve stenosis. Tricuspid Valve: The tricuspid valve is normal  in structure. Tricuspid valve regurgitation is not demonstrated. No evidence of tricuspid stenosis. Aortic Valve: The aortic valve is tricuspid. Aortic valve regurgitation is not visualized. Aortic valve sclerosis is present, with no evidence of aortic valve stenosis. Aortic valve mean gradient measures 1.0 mmHg. Aortic valve peak gradient measures 2.8  mmHg. Aortic valve area, by VTI measures 2.65 cm. Pulmonic Valve: The pulmonic valve was normal in structure. Pulmonic valve regurgitation is not visualized. No evidence of pulmonic stenosis. Aorta: The aortic root is normal in size and structure. Venous: The inferior vena cava is normal in size with greater than  50% respiratory variability, suggesting right atrial pressure of 3 mmHg. IAS/Shunts: No atrial level shunt detected by color flow Doppler.  LEFT VENTRICLE PLAX 2D LVIDd:         3.60 cm   Diastology LVIDs:         2.60 cm   LV e' medial:    5.55 cm/s LV PW:         1.10 cm   LV E/e' medial:  6.3 LV IVS:        1.20 cm   LV e' lateral:   6.74 cm/s LVOT diam:     2.00 cm   LV E/e' lateral: 5.2 LV SV:         42 LV SV Index:   23 LVOT Area:     3.14 cm  RIGHT VENTRICLE RV Basal diam:  3.50 cm RV S prime:     11.50 cm/s TAPSE (M-mode): 2.1 cm LEFT ATRIUM             Index        RIGHT ATRIUM           Index LA diam:        3.70 cm 1.98 cm/m   RA Area:     21.30 cm LA Vol (A2C):   59.2 ml 31.72 ml/m  RA Volume:   64.10 ml  34.35 ml/m LA Vol (A4C):   42.3 ml 22.67 ml/m LA Biplane Vol: 50.7 ml 27.17 ml/m  AORTIC VALVE AV Area (Vmax):    2.64 cm AV Area (Vmean):   2.62 cm AV Area (VTI):     2.65 cm AV Vmax:           83.40 cm/s AV Vmean:          56.500 cm/s AV VTI:            0.159 m AV Peak Grad:      2.8 mmHg AV Mean Grad:      1.0 mmHg LVOT Vmax:         70.20 cm/s LVOT Vmean:        47.200 cm/s LVOT VTI:          0.134 m LVOT/AV VTI ratio: 0.84  AORTA Ao Root diam: 3.60 cm Ao Asc diam:  3.50 cm MITRAL VALVE MV Area (PHT): 3.91 cm    SHUNTS MV Decel Time: 194 msec    Systemic VTI:  0.13 m MV E velocity: 35.10 cm/s  Systemic Diam: 2.00 cm MV A velocity: 88.00 cm/s MV E/A ratio:  0.40 Kirk Ruths MD Electronically signed by Kirk Ruths MD Signature Date/Time: 10/24/2021/5:14:34 PM    Final     Assessment: The patient right PCA stroke.  Stroke work-up is complete.  He is enrolled in a trial.  He will follow-up with Dr. Leonie Man outpatient.  Loop recorder on discharge.  Neurology will sign off.  Total of 25 mins spent reviewing chart, discussion with patient and family on prognosis, Dx and plan. Discussed case with patient's nurse. Reviewed Imaging personally.     Dorene Grebe

## 2021-10-26 NOTE — Progress Notes (Addendum)
Physical Therapy Treatment Patient Details Name: Roberto [name] MRN: [id number] DOB: [birth year] Today's Date: 10/26/2021   History of Present Illness 71 yo male admitted with L side facial and extremity numbness starting 6/7 MRI(+) multifocal acute ischemia R PCA territory involving dorsal thalmas, medial R temporal lobe, R occipial lobe PMH CVA 2017 glaucoma, HTN    PT Comments    Pt was seen for progression to side of bed today, has been reluctant to try and was greatly encouraged by his wife to work with PT.  Requires min mod assist to sit up and work on balance skills with active assisted LE exercises.  Pt is not feeling energetic and finally insisted on return to bed.  Repositioned to encourage him to be active in his core, and wife has agreed to try to encourage OOB later.  Wife has been vocal that she cannot move him alone.  Will recommend CIR admission due to the extent of deficits, and will recommend him to get as much follow up therapy as possible given the challenges for his wife to care for him.  Also WB on L side with support on UE with pillow in bed to increase input for recovery.  Recommendations for follow up therapy are one component of a multi-disciplinary discharge planning process, led by the attending physician.  Recommendations may be updated based on patient status, additional functional criteria and insurance authorization.  Follow Up Recommendations  Acute inpatient rehab (3hours/day)     Assistance Recommended at Discharge Frequent or constant Supervision/Assistance  Patient can return home with the following Two people to help with walking and/or transfers;A little help with bathing/dressing/bathroom;Assistance with cooking/housework;Assist for transportation;Help with stairs or ramp for entrance   Equipment Recommendations  Other (comment) (unsafe to walk and test this)    Recommendations for Other Services Rehab consult     Precautions / Restrictions  Precautions Precautions: Fall Restrictions Weight Bearing Restrictions: No     Mobility  Bed Mobility Overal bed mobility: Needs Assistance Bed Mobility: Supine to Sit, Sit to Supine     Supine to sit: Min assist, Mod assist Sit to supine: Min assist, Mod assist   General bed mobility comments: mod for trunk support and for encouragement of effort    Transfers Overall transfer level: Needs assistance                 General transfer comment: declined to attempt    Ambulation/Gait               General Gait Details: pt declined to attempt   Stairs             Wheelchair Mobility    Modified Rankin (Stroke Patients Only) Modified Rankin (Stroke Patients Only) Pre-Morbid Rankin Score: No symptoms Modified Rankin: Moderately severe disability     Balance Overall balance assessment: Needs assistance Sitting-balance support: Feet supported, Single extremity supported Sitting balance-Leahy Scale: Fair     Standing balance support: Bilateral upper extremity supported, During functional activity   Standing balance comment: unable to get pt up to stand                            Cognition Arousal/Alertness: Lethargic Behavior During Therapy: Anxious, Flat affect Overall Cognitive Status: Impaired/Different from baseline Area of Impairment: Problem solving, Awareness, Safety/judgement  Safety/Judgement: Decreased awareness of safety, Decreased awareness of deficits Awareness: Intellectual Problem Solving: Slow processing, Requires verbal cues, Requires tactile cues General Comments: requires repetitive information for balance and hand placement        Exercises      General Comments General comments (skin integrity, edema, etc.): pt is generally weak on L side but can actively assist with movment, declining to use L side to get up to stand      Pertinent Vitals/Pain Pain Assessment Pain  Assessment: No/denies pain    Home Living                          Prior Function            PT Goals (current goals can now be found in the care plan section) Acute Rehab PT Goals Patient Stated Goal: to ultimately go back to work, PLOF. Progress towards PT goals: PT to reassess next treatment    Frequency    Min 4X/week      PT Plan Current plan remains appropriate    Co-evaluation              AM-PAC PT "6 Clicks" Mobility   Outcome Measure  Help needed turning from your back to your side while in a flat bed without using bedrails?: A Lot Help needed moving from lying on your back to sitting on the side of a flat bed without using bedrails?: A Lot Help needed moving to and from a bed to a chair (including a wheelchair)?: Total Help needed standing up from a chair using your arms (e.g., wheelchair or bedside chair)?: Total Help needed to walk in hospital room?: Total Help needed climbing 3-5 steps with a railing? : Total 6 Click Score: 8    End of Session Equipment Utilized During Treatment: Gait belt Activity Tolerance: Patient tolerated treatment well;Patient limited by fatigue;Treatment limited secondary to medical complications (Comment) Patient left: in bed;with call bell/phone within reach;with family/visitor present;Other (comment) Nurse Communication: Mobility status PT Visit Diagnosis: Unsteadiness on feet (R26.81);Other abnormalities of gait and mobility (R26.89);Difficulty in walking, not elsewhere classified (R26.2);Other symptoms and signs involving the nervous system (R29.898)     Time: [id number] PT Time Calculation (min) (ACUTE ONLY): 26 min  Charges:  $Therapeutic Exercise: 8-22 mins $Therapeutic Activity: 8-22 mins Ramond Dial 10/26/2021, 6:01 PM  Mee Hives, PT PhD Acute Rehab Dept. Number: [place] [facility] [place]

## 2021-10-26 NOTE — Progress Notes (Signed)
Subjective: Patient noted by RN to have acute onset of worsened left sided weakness. Code Stroke was called.   Objective: Current vital signs: BP 131/78 (BP Location: Left Arm)   Pulse 83   Temp 99.2 F (37.3 C) (Oral)   Resp 18   Wt 77.1 kg   SpO2 100%   BMI 27.43 kg/m  Vital signs in last 24 hours: Temp:  [97.7 F (36.5 C)-99.2 F (37.3 C)] 99.2 F (37.3 C) (06/12 1633) Pulse Rate:  [78-96] 83 (06/12 1633) Resp:  [16-18] 18 (06/12 1633) BP: (131-161)/(74-95) 131/78 (06/12 1633) SpO2:  [93 %-100 %] 100 % (06/12 1633)  Intake/Output from previous day: 06/11 0701 - 06/12 0700 In: 360 [P.O.:360] Out: 600 [Urine:600] Intake/Output this shift: No intake/output data recorded. Nutritional status:  Diet Order             Diet Heart Room service appropriate? Yes; Fluid consistency: Thin  Diet effective now                   Neurologic Exam: Ment: Awake and alert. Speech fluent without evidence for aphasia. Left sided neglect is noted.  CN: Left homonymous hemianopsia. Can gaze to the right conjugately without difficulty. Cannot track past midline to the left, but can cross the midline to the left after some hesitation with saccades. Impaired fine touch sensation to left side of face. No facial droop with smile. Phonation intact. 0/5 left shoulder shrug.  Motor:  LUE: 0/5 proximally and distally except for 2/5 triceps.   LLE: 2/5 proximally and distally except 3/5 quadriceps RUE and RLE 5/5 Sensory: Insensate to fine touch, coarse touch and pressure to LUE. Cannot feel FT or pressure to left thigh, but can feel pressure to left leg below knee. Intact sensation on the right Reflexes: Normoactive x 4 Cerebellar: No ataxia on the right. Unable to assess on the left.  Gait: Unable to assess.   Pertinent portion of Dr. Clydene Fake exam from yesterday: Motor system exam shows significant upper extremity drift with grip weakness and 2-3/5 strength.. Left lower extremity drift with  4/5 strength.  Mild weakness of left hip flexors 4+ out of 5.   Lab Results: Results for orders placed or performed during the hospital encounter of 10/23/21 (from the past 48 hour(s))  CBC     Status: Abnormal   Collection Time: 10/25/21  4:24 AM  Result Value Ref Range   WBC 11.5 (H) 4.0 - 10.5 K/uL   RBC 5.45 4.22 - 5.81 MIL/uL   Hemoglobin 17.1 (H) 13.0 - 17.0 g/dL   HCT 50.8 39.0 - 52.0 %   MCV 93.2 80.0 - 100.0 fL   MCH 31.4 26.0 - 34.0 pg   MCHC 33.7 30.0 - 36.0 g/dL   RDW 12.8 11.5 - 15.5 %   Platelets 198 150 - 400 K/uL   nRBC 0.0 0.0 - 0.2 %    Comment: Performed at [facility], [place] [address], [place], [place] [id number]  Basic metabolic panel     Status: Abnormal   Collection Time: 10/25/21  4:24 AM  Result Value Ref Range   Sodium 133 (L) 135 - 145 mmol/L   Potassium 4.0 3.5 - 5.1 mmol/L   Chloride 101 98 - 111 mmol/L   CO2 24 22 - 32 mmol/L   Glucose, Bld 111 (H) 70 - 99 mg/dL    Comment: Glucose reference range applies only to samples taken after fasting for at least 8 hours.  BUN 14 8 - 23 mg/dL   Creatinine, Ser 0.82 0.61 - 1.24 mg/dL   Calcium 8.4 (L) 8.9 - 10.3 mg/dL   GFR, Estimated >60 >60 mL/min    Comment: (NOTE) Calculated using the CKD-EPI Creatinine Equation (2021)    Anion gap 8 5 - 15    Comment: Performed at [facility] [address], [place], [place] [address]  Erythropoietin     Status: None   Collection Time: 10/25/21 11:10 AM  Result Value Ref Range   Erythropoietin 7.2 2.6 - 18.5 mIU/mL    Comment: (NOTE) [employer] UniCel DxI 800 Immunoassay System Values obtained with different assay methods or kits cannot be used interchangeably. Results cannot be interpreted as absolute evidence of the presence or absence of malignant disease. Performed At: [facility] [place], [place] [id number] Rush Farmer MD [id number]   CBC     Status: Abnormal   Collection Time: 10/26/21  4:02 AM  Result  Value Ref Range   WBC 10.1 4.0 - 10.5 K/uL   RBC 5.45 4.22 - 5.81 MIL/uL   Hemoglobin 17.5 (H) 13.0 - 17.0 g/dL   HCT 51.2 39.0 - 52.0 %   MCV 93.9 80.0 - 100.0 fL   MCH 32.1 26.0 - 34.0 pg   MCHC 34.2 30.0 - 36.0 g/dL   RDW 12.7 11.5 - 15.5 %   Platelets 187 150 - 400 K/uL   nRBC 0.0 0.0 - 0.2 %    Comment: Performed at [facility], [place] [address] [place], [place] [id number]  Basic metabolic panel     Status: Abnormal   Collection Time: 10/26/21  4:02 AM  Result Value Ref Range   Sodium 131 (L) 135 - 145 mmol/L   Potassium 4.1 3.5 - 5.1 mmol/L   Chloride 102 98 - 111 mmol/L   CO2 20 (L) 22 - 32 mmol/L   Glucose, Bld 106 (H) 70 - 99 mg/dL    Comment: Glucose reference range applies only to samples taken after fasting for at least 8 hours.   BUN 14 8 - 23 mg/dL   Creatinine, Ser 0.99 0.61 - 1.24 mg/dL   Calcium 8.4 (L) 8.9 - 10.3 mg/dL   GFR, Estimated >60 >60 mL/min    Comment: (NOTE) Calculated using the CKD-EPI Creatinine Equation (2021)    Anion gap 9 5 - 15    Comment: Performed at [facility] [address], [place], [place] [address]  Glucose, capillary     Status: Abnormal   Collection Time: 10/26/21  7:22 PM  Result Value Ref Range   Glucose-Capillary 139 (H) 70 - 99 mg/dL    Comment: Glucose reference range applies only to samples taken after fasting for at least 8 hours.    No results found for this or any previous visit (from the past 240 hour(s)).  Lipid Panel No results for input(s): "CHOL", "TRIG", "HDL", "CHOLHDL", "VLDL", "LDLCALC" in the last 72 hours.  Studies/Results: No results found.  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 He presented outside time window for thrombolysis and CT angiogram did not show any LVO.  Vascular risk factors of hypertension, hyperlipidemia, extracranial ICA stenosis. He has been enrolled in the [facility] trial and is on ASA, Plavix and placebo vs clinical trial medication (asundexan).  1. 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  2. NIHSS = 10. 3. CT head: Increased size of evolving early subacute right PCA distribution infarct. No evidence for hemorrhagic transformation or significant regional mass effect. 4. CTA of head and neck: Occlusion of the proximal right P2 segment, in keeping with the evolving early subacute right PCA distribution infarct. No other emergent large vessel occlusion. Moderate proximal left P2 stenosis. Mild-to-moderate atheromatous change about the carotid bifurcations and carotid siphons.  5. Not a TNK candidate due to recent stroke. Not a thrombectomy candidate as LKN based on last detailed baseline exam was > 24 hours ago (see Dr. Clydene Fake note), in addition to high risk for hemorrhagic transformation due to luxury perfusion if a thrombectomy is performed.   Recommendations: 1. Continue Plavix, ASA and trial medication versus placebo.  2. Frequent neuro checks.  3. The patient is participating in the [facility] stroke prevention trial  (standard antiplatelet therapy plus factor XI inhibitor Asundexian versus standard medical therapy plus placebo). He has been randomized and will go home with the study medication provided by pharmacy at discharge.  He will need loop recorder insertion at discharge to look for paroxysmal A-fib.   4. SBP goal of 130-150 given high risk for  hemorrhagic transformation of his large right PCA territory stroke. Benefits of permissive HTN are significantly outweighed by the risks.  5. Further recommendations following Stroke Team reassessment tomorrow.  40 minutes of critical care time spent today in the re-evaluation of this stroke patient with acute neurological decompensation and imminent high risk of severe morbidity and mortality.    LOS: 2 days   @Electronically  signed: Dr. Kerney Elbe 10/26/2021  7:58 PM

## 2021-10-26 NOTE — Progress Notes (Signed)
  Transition of Care [facility]) Screening Note   Patient Details  Name: Roberto [name] Date of Birth: [birth year]   Transition of Care [facility] CM/SW Contact:    Kermit Balo, RN Phone Number: 10/26/2021, 2:35 PM   Patient is from home with his girlfriend. CIR has started insurance for a rehab stay. Transition of Care Department [facility]) has reviewed patient. We will continue to monitor patient advancement through interdisciplinary progression rounds. If new patient transition needs arise, please place a TOC consult.

## 2021-10-26 NOTE — Progress Notes (Addendum)
Received a page that patient had an alteration in consciousness level. Went to bedside to assess. By the time IMTS arrived at bedside patient was again alert and oriented and answering questions appropriately with rapid response at bedside. However he had new neurological deficits concerning for a worsening of his prior stroke and a code stroke was called.  Exam: Gen: elderly male lying in bed in NAD Resp: normal WOB on room air Neuro: alert and oriented x3, decreased sensation over the left face, upper and lower extremities. 2/5 on grip strength on the LUE, 0/5 of the proximal LUE, 1/5 of the forearm. 4 -/5 on the LL.  Left homonymous hemianopsia   CT scan of head was negative for hemorrhage but did show increased size of evolving subacute stroke of the right PCA distribution. Last known normal was likely when IMTS rounded this morning. However given the fact that the strokes already show up on CT this stroke extension had occurred hours before the transient alteration in consciousness level that occurred prior to the code stroke being called. Given the recent PCA stroke in the same distribution as well as the age of this new/worsened infarct he is not a candidate for any thrombolytic therapy or revascularization given high risk of bleed. Will continue with current management on DAPT as well as the [facility] stroke study (asundexian vs placebo) medication. Per neurology will not allow for permissive hypertension given the significant risk of bleeding due to the size of the stroke. Blood pressure goal of between 130-150. Will keep Q4 neuro checks at this time.   Addendum:  Received a page that patient was complaining of nausea, went to bedside to assess. Patient was more confused and less alert than prior. Upon further clarification with nurse the patient was witnessed to have a coughing spell followed by retching. His HR went up the the 130s and his oxygen saturation lowered to the high 80s low 90s on room  air. His blood pressure dropped to 105/77. I suspect he may have aspirated which resulted in his transient desaturation as well as his drop in blood pressure/elevated HR. Ordered an EKG which verified he was in sinus tachycardia. CXR was normal. Blood pressure normalized back the 130s, however will give a 500 mL bolus to try maintain the target BP of 130-150. No new neurologic deficits on exam, change in consciousness improved with normalization of blood pressure. Given concern for possible aspiration will make patient NPO until speech can evaluate him.

## 2021-10-26 NOTE — Significant Event (Addendum)
Rapid Response Event Note   Reason for Call :  Change in LOC, new L sided weakness  Initial Focused Assessment:  On arrival, pt laying in bed, able to answer simple questions and follow commands. (Per RN pt not responsive at time of initial call). BP 209/190? HR 70s, RR 18, spO2 97% on RA. Recheck of BP 127/63. CBG 139  Primary team at bedside.   Left sided weakness noted, diminished sensation to L, and visual neglect on L also noted. NIH 13   Interventions:  Code Stroke called by primary team.  Head CT/CTA- worsening of R PCA.   Plan of Care:  Continue to monitor neuro status.  SBP goal 130-150.   RN instructed to call with any changes or concerns.     Event Summary:   MD Notified: PTA (1920) Call Time: 1920 Arrival Time: 1930 End Time: 2025  Sherilyn Dacosta, RN

## 2021-10-27 ENCOUNTER — Inpatient Hospital Stay (HOSPITAL_COMMUNITY): Payer: PRIVATE HEALTH INSURANCE

## 2021-10-27 DIAGNOSIS — J690 Pneumonitis due to inhalation of food and vomit: Secondary | ICD-10-CM

## 2021-10-27 DIAGNOSIS — I63431 Cerebral infarction due to embolism of right posterior cerebral artery: Secondary | ICD-10-CM | POA: Diagnosis not present

## 2021-10-27 DIAGNOSIS — E871 Hypo-osmolality and hyponatremia: Secondary | ICD-10-CM

## 2021-10-27 DIAGNOSIS — N179 Acute kidney failure, unspecified: Secondary | ICD-10-CM

## 2021-10-27 DIAGNOSIS — D751 Secondary polycythemia: Secondary | ICD-10-CM

## 2021-10-27 LAB — HEPATIC FUNCTION PANEL
ALT: 24 U/L (ref 0–44)
AST: 23 U/L (ref 15–41)
Albumin: 3.2 g/dL — ABNORMAL LOW (ref 3.5–5.0)
Alkaline Phosphatase: 63 U/L (ref 38–126)
Bilirubin, Direct: 0.4 mg/dL — ABNORMAL HIGH (ref 0.0–0.2)
Indirect Bilirubin: 1 mg/dL — ABNORMAL HIGH (ref 0.3–0.9)
Total Bilirubin: 1.4 mg/dL — ABNORMAL HIGH (ref 0.3–1.2)
Total Protein: 7.4 g/dL (ref 6.5–8.1)

## 2021-10-27 LAB — BASIC METABOLIC PANEL
Anion gap: 14 (ref 5–15)
BUN: 26 mg/dL — ABNORMAL HIGH (ref 8–23)
CO2: 19 mmol/L — ABNORMAL LOW (ref 22–32)
Calcium: 8.9 mg/dL (ref 8.9–10.3)
Chloride: 98 mmol/L (ref 98–111)
Creatinine, Ser: 1.5 mg/dL — ABNORMAL HIGH (ref 0.61–1.24)
GFR, Estimated: 49 mL/min — ABNORMAL LOW (ref >60)
Glucose, Bld: 112 mg/dL — ABNORMAL HIGH (ref 70–99)
Potassium: 4.1 mmol/L (ref 3.5–5.1)
Sodium: 131 mmol/L — ABNORMAL LOW (ref 135–145)

## 2021-10-27 LAB — BLOOD CULTURE ID PANEL (REFLEXED) - BCID2
A.calcoaceticus-baumannii: NOT DETECTED
Bacteroides fragilis: NOT DETECTED
Candida albicans: NOT DETECTED
Candida auris: NOT DETECTED
Candida glabrata: NOT DETECTED
Candida krusei: NOT DETECTED
Candida parapsilosis: NOT DETECTED
Candida tropicalis: NOT DETECTED
Cryptococcus neoformans/gattii: NOT DETECTED
Enterobacter cloacae complex: NOT DETECTED
Enterobacterales: NOT DETECTED
Enterococcus Faecium: NOT DETECTED
Enterococcus faecalis: NOT DETECTED
Escherichia coli: NOT DETECTED
Haemophilus influenzae: NOT DETECTED
Klebsiella aerogenes: NOT DETECTED
Klebsiella oxytoca: NOT DETECTED
Klebsiella pneumoniae: NOT DETECTED
Listeria monocytogenes: NOT DETECTED
Meth resistant mecA/C and MREJ: DETECTED — AB
Neisseria meningitidis: NOT DETECTED
Proteus species: NOT DETECTED
Pseudomonas aeruginosa: NOT DETECTED
Salmonella species: NOT DETECTED
Serratia marcescens: NOT DETECTED
Staphylococcus aureus (BCID): DETECTED — AB
Staphylococcus epidermidis: NOT DETECTED
Staphylococcus lugdunensis: NOT DETECTED
Staphylococcus species: DETECTED — AB
Stenotrophomonas maltophilia: NOT DETECTED
Streptococcus agalactiae: NOT DETECTED
Streptococcus pneumoniae: NOT DETECTED
Streptococcus pyogenes: DETECTED — AB
Streptococcus species: DETECTED — AB

## 2021-10-27 LAB — CBC WITH DIFFERENTIAL/PLATELET
Abs Immature Granulocytes: 0.77 10*3/uL — ABNORMAL HIGH (ref 0.00–0.07)
Basophils Absolute: 0.1 10*3/uL (ref 0.0–0.1)
Basophils Relative: 0 %
Eosinophils Absolute: 0 10*3/uL (ref 0.0–0.5)
Eosinophils Relative: 0 %
HCT: 53.8 % — ABNORMAL HIGH (ref 39.0–52.0)
Hemoglobin: 18.8 g/dL — ABNORMAL HIGH (ref 13.0–17.0)
Immature Granulocytes: 3 %
Lymphocytes Relative: 2 %
Lymphs Abs: 0.5 10*3/uL — ABNORMAL LOW (ref 0.7–4.0)
MCH: 32.2 pg (ref 26.0–34.0)
MCHC: 34.9 g/dL (ref 30.0–36.0)
MCV: 92.1 fL (ref 80.0–100.0)
Monocytes Absolute: 2.2 10*3/uL — ABNORMAL HIGH (ref 0.1–1.0)
Monocytes Relative: 8 %
Neutro Abs: 23.9 10*3/uL — ABNORMAL HIGH (ref 1.7–7.7)
Neutrophils Relative %: 87 %
Platelets: 202 10*3/uL (ref 150–400)
RBC: 5.84 MIL/uL — ABNORMAL HIGH (ref 4.22–5.81)
RDW: 12.9 % (ref 11.5–15.5)
WBC: 27.4 10*3/uL — ABNORMAL HIGH (ref 4.0–10.5)
nRBC: 0 % (ref 0.0–0.2)

## 2021-10-27 LAB — LACTIC ACID, PLASMA
Lactic Acid, Venous: 1.7 mmol/L (ref 0.5–1.9)
Lactic Acid, Venous: 1.2 mmol/L (ref 0.5–1.9)

## 2021-10-27 LAB — TECHNOLOGIST SMEAR REVIEW

## 2021-10-27 LAB — CBC
HCT: 54.2 % — ABNORMAL HIGH (ref 39.0–52.0)
Hemoglobin: 18.7 g/dL — ABNORMAL HIGH (ref 13.0–17.0)
MCH: 32.2 pg (ref 26.0–34.0)
MCHC: 34.5 g/dL (ref 30.0–36.0)
MCV: 93.3 fL (ref 80.0–100.0)
Platelets: 203 10*3/uL (ref 150–400)
RBC: 5.81 MIL/uL (ref 4.22–5.81)
RDW: 13 % (ref 11.5–15.5)
WBC: 27.2 10*3/uL — ABNORMAL HIGH (ref 4.0–10.5)
nRBC: 0 % (ref 0.0–0.2)

## 2021-10-27 LAB — MRSA NEXT GEN BY PCR, NASAL: MRSA by PCR Next Gen: NOT DETECTED

## 2021-10-27 LAB — SAVE SMEAR(SSMR), FOR PROVIDER SLIDE REVIEW

## 2021-10-27 MED ORDER — VANCOMYCIN HCL 1500 MG/300ML IV SOLN
1500.0000 mg | Freq: Once | INTRAVENOUS | Status: DC
  Administered 2021-10-27: 1500 mg via INTRAVENOUS
  Filled 2021-10-27: qty 300

## 2021-10-27 MED ORDER — SODIUM CHLORIDE 0.9 % IV SOLN
2.0000 g | INTRAVENOUS | Status: DC
  Administered 2021-10-27: 2 g via INTRAVENOUS
  Filled 2021-10-27: qty 20

## 2021-10-27 MED ORDER — LOSARTAN POTASSIUM 50 MG PO TABS: 50.0000 mg | ORAL_TABLET | Freq: Every day | ORAL | Status: DC

## 2021-10-27 MED ORDER — SODIUM CHLORIDE 0.9 % IV SOLN
2.0000 g | Freq: Two times a day (BID) | INTRAVENOUS | Status: DC
  Administered 2021-10-27: 2 g via INTRAVENOUS
  Filled 2021-10-27: qty 12.5

## 2021-10-27 MED ORDER — LACTATED RINGERS IV BOLUS
1000.0000 mL | Freq: Once | INTRAVENOUS | Status: AC
  Administered 2021-10-27: 1000 mL via INTRAVENOUS

## 2021-10-27 MED ORDER — METRONIDAZOLE 500 MG/100ML IV SOLN
500.0000 mg | Freq: Two times a day (BID) | INTRAVENOUS | Status: DC
  Administered 2021-10-27: 500 mg via INTRAVENOUS
  Filled 2021-10-27: qty 100

## 2021-10-27 MED ORDER — VANCOMYCIN VARIABLE DOSE PER UNSTABLE RENAL FUNCTION (PHARMACIST DOSING): Status: DC

## 2021-10-27 MED ORDER — SODIUM CHLORIDE 0.9 % IV SOLN
3.0000 g | Freq: Three times a day (TID) | INTRAVENOUS | Status: DC
  Administered 2021-10-27: 3 g via INTRAVENOUS
  Filled 2021-10-27 (×3): qty 8

## 2021-10-27 NOTE — Progress Notes (Signed)
Morning labs came back with a leukocytosis of 27 with a neutrophilic predominance from a prior baseline WNL. He has evidence of end organ damage with an AKI, he is also tachycardic to the low 100s with a new oxygen requirement. Infectious workup started with lactic acid, hepatic function panel, and blood cultures. Giving patient another fluid bolus given AKI and low normal blood pressures (targeting a BP between 130-150). No clear source of infection at this time. CXR earlier this evening was normal and patient is not having any urinary symptoms. Could have a component of reactive leukocytosis in response to stroke and aspiration however would not expect it to be this high. Will continue to monitor.

## 2021-10-27 NOTE — Progress Notes (Addendum)
Occupational Therapy Treatment Patient Details Name: Roberto [name] MRN: [id number] DOB: [birth year] Today's Date: 10/27/2021   History of present illness 71 yo male admitted with L side facial and extremity numbness starting 6/7 MRI(+) multifocal acute ischemia R PCA territory involving dorsal thalmas, medial R temporal lobe, R occipial lobe. Code stroke activated 6/12, MRI revealing Increased size of evolving early subacute right PCA distribution infarct.  PMH CVA 2017 glaucoma, HTN   OT comments  Pt with significant event (code stroke) overnight; repeat MRI revealing increased size of evolving early subacute R PCA distribution infarct. Pt with trace grasp in LUE, no  AROM, PROM WFL. Pt with L neglect, requiring max cues to attend to L side stimuli. Pt max - total A for UB ADL this session, mod-max A +2 for bed mobility, fatiguing quickly sitting EOB. BP WNL. Pt presenting with impairments listed below, will follow acutely. Continue to recommend AIR at d/c.   Recommendations for follow up therapy are one component of a multi-disciplinary discharge planning process, led by the attending physician.  Recommendations may be updated based on patient status, additional functional criteria and insurance authorization.    Follow Up Recommendations  Acute inpatient rehab (3hours/day)    Assistance Recommended at Discharge Frequent or constant Supervision/Assistance  Patient can return home with the following  Two people to help with walking and/or transfers;A lot of help with bathing/dressing/bathroom;Assist for transportation;Assistance with feeding;Assistance with cooking/housework;Direct supervision/assist for medications management;Direct supervision/assist for financial management   Equipment Recommendations  None recommended by OT;Other (comment) (defer to next venue of care)    Recommendations for Other Services Rehab consult    Precautions / Restrictions Precautions Precautions:  Fall Restrictions Weight Bearing Restrictions: No       Mobility Bed Mobility Overal bed mobility: Needs Assistance Bed Mobility: Supine to Sit, Sit to Supine, Rolling Rolling: Mod assist, +2 for physical assistance   Supine to sit: Max assist, +2 for physical assistance Sit to supine: Max assist, +2 for physical assistance   General bed mobility comments: mod A for trunk support, fatiguing quickly    Transfers                   General transfer comment: pt declining     Balance Overall balance assessment: Needs assistance Sitting-balance support: Feet supported, Single extremity supported Sitting balance-Leahy Scale: Fair                                     ADL either performed or assessed with clinical judgement   ADL Overall ADL's : Needs assistance/impaired                 Upper Body Dressing : Maximal assistance;Sitting;Total assistance Upper Body Dressing Details (indicate cue type and reason): to don gown                        Extremity/Trunk Assessment Upper Extremity Assessment Upper Extremity Assessment: LUE deficits/detail LUE Deficits / Details: trace grasp noted in LUE, no active movement in L shoulder, elbow, wrist. PROM WFL LUE Sensation: decreased light touch;decreased proprioception LUE Coordination: decreased fine motor;decreased gross motor   Lower Extremity Assessment Lower Extremity Assessment: Defer to PT evaluation        Vision   Vision Assessment?: Vision impaired- to be further tested in functional context Additional Comments: L homonymous hemianopsia, R gaze with L inattention  Perception Perception Perception: Impaired   Praxis Praxis Praxis: Impaired Praxis Impairment Details: Ideation;Initiation;Ideomotor    Cognition Arousal/Alertness: Lethargic Behavior During Therapy: Anxious, Flat affect Overall Cognitive Status: Impaired/Different from baseline Area of Impairment: Problem  solving, Awareness, Safety/judgement                         Safety/Judgement: Decreased awareness of safety, Decreased awareness of deficits Awareness: Intellectual Problem Solving: Slow processing, Requires verbal cues, Requires tactile cues General Comments: requires repetitive information for balance and hand placement        Exercises Exercises: Other exercises Other Exercises Other Exercises: WB through L elbow x5    Shoulder Instructions       General Comments VSS on RA; wife present during session    Pertinent Vitals/ Pain       Pain Assessment Pain Assessment: No/denies pain  Home Living                                          Prior Functioning/Environment              Frequency  Min 2X/week        Progress Toward Goals  OT Goals(current goals can now be found in the care plan section)  Progress towards OT goals: Not progressing toward goals - comment (pt with new CVA)  Acute Rehab OT Goals Patient Stated Goal: none stated OT Goal Formulation: With patient/family Time For Goal Achievement: 11/07/21 Potential to Achieve Goals: Good ADL Goals Pt Will Perform Eating: with min guard assist;sitting;with adaptive utensils Pt Will Perform Grooming: with min guard assist;sitting;with adaptive equipment Pt Will Transfer to Toilet: with min assist;ambulating;bedside commode Additional ADL Goal #1: pt will locate L UE with R UE and position Mod I prior to any transfer attempts Additional ADL Goal #2: pt will complete HEP L UE with Min (A) written directions with girlfriend  Plan Discharge plan remains appropriate;Frequency remains appropriate    Co-evaluation    PT/OT/SLP Co-Evaluation/Treatment: Yes Reason for Co-Treatment: Complexity of the patient's impairments (multi-system involvement);For patient/therapist safety;To address functional/ADL transfers   OT goals addressed during session: ADL's and  self-care;Strengthening/ROM      AM-PAC OT "6 Clicks" Daily Activity     Outcome Measure   Help from another person eating meals?: A Lot Help from another person taking care of personal grooming?: A Lot Help from another person toileting, which includes using toliet, bedpan, or urinal?: Total Help from another person bathing (including washing, rinsing, drying)?: A Lot Help from another person to put on and taking off regular upper body clothing?: A Lot Help from another person to put on and taking off regular lower body clothing?: Total 6 Click Score: 10    End of Session    OT Visit Diagnosis: Unsteadiness on feet (R26.81);Muscle weakness (generalized) (M62.81);Low vision, both eyes (H54.2)   Activity Tolerance Patient tolerated treatment well   Patient Left in bed;with call bell/phone within reach;with bed alarm set;with family/visitor present   Nurse Communication Mobility status;Precautions;Other (comment) (pt's medical stability)        Time: [id number] OT Time Calculation (min): 29 min  Charges: OT General Charges $OT Visit: 1 Visit OT Treatments $Self Care/Home Management : 8-22 mins  Lynnda Child, OTD, OTR/L Acute Rehab [phone]) [phone]   Kaylyn Lim 10/27/2021, 1:15 PM

## 2021-10-27 NOTE — Progress Notes (Signed)
Speech Language Pathology Treatment: Dysphagia  Patient Details Name: Roberto [name] MRN: [id number] DOB: 26-Dec-1950 Today's Date: 10/27/2021 Time: [id number] SLP Time Calculation (min) (ACUTE ONLY): 30 min  Assessment / Plan / Recommendation Clinical Impression  Pt seen for reassessment of swallowing function (seen for initial eval 6/10), post rapid response event and imaging revealing worsening of R PCA. New BSE orders placed due to concerns for aspiration.   Pt lethargic this am and required intermittent verbal/tactile cues to remain alert throughout session. Oral mechanism examination significant for reduced L labial and lingual strength/sensation. L lingual deviation also noted upon protrusion. Pt consumed 3oz of water by straw via consecutive swallowing (3oz water challenge), demonstrating immediate belching and delayed brief overt cough, concerning for reduced airway protection vs esophageal component. Single sips of liquids by cup reduced s/sx of aspiration significantly, however belching persisted and some L anterior loss of liquid exhibited. Anterior loss, prolonged mastication and min diffuse lingual residuals noted with regular solid consumption. Oral clearance achieved with liquid wash. Given clinical presentation this date and likely impact of fatigue, recommend dys 3/ thin liquid diet (no straws). Will f/u for tolerance and if instrumental swallow assessment is warranted. Above noted to RN.   HPI HPI: Pt is a 71 y.o. M  who presented 6/9 with L sided facial and extremity numbness for two days. He states that he began feeling like something was 'off' a few days ago, and that yesterday he fell when walking back into his house from the mail box. When he fell he realized he couldn't walked and leaned on his truck to help get back inside. At that time he went to bed and when he woke this morning had L sided numbness and some arm pain; 10/23/21 MRI head revealed Multifocal acute ischemia within the  right PCA territory, involving the dorsal thalamus, medial right temporal lobe and the right occipital lobe. No hemorrhage or mass effect. BSE 6/10 revealed oral deficits, regular/thin liquid diet recommended. 6/12 Code Stroke called with Head CT/CTA showed worsening of R PCA. CXR negative. New BSE ordered due to conerns for worsening dysphagia. PMH: CVA, glaucoma, HTN.      SLP Plan  Continue with current plan of care      Recommendations for follow up therapy are one component of a multi-disciplinary discharge planning process, led by the attending physician.  Recommendations may be updated based on patient status, additional functional criteria and insurance authorization.    Recommendations  Diet recommendations: Dysphagia 3 (mechanical soft);Thin liquid Liquids provided via: Cup;No straw Medication Administration: Whole meds with puree Supervision: Staff to assist with self feeding;Full supervision/cueing for compensatory strategies Compensations: Minimize environmental distractions;Slow rate;Small sips/bites;Follow solids with liquid Postural Changes and/or Swallow Maneuvers: Seated upright 90 degrees;Upright 30-60 min after meal                Oral Care Recommendations: Oral care BID;Staff/trained caregiver to provide oral care Follow Up Recommendations: Acute inpatient rehab (3hours/day) Assistance recommended at discharge: Frequent or constant Supervision/Assistance SLP Visit Diagnosis: Dysphagia, unspecified (R13.10) Plan: Continue with current plan of care            Ellwood Dense, [place], [place] Office Number: Anon Raices  10/27/2021, 9:36 AM

## 2021-10-27 NOTE — Progress Notes (Signed)
Physical Therapy Treatment Patient Details Name: Roberto [name] MRN: [id number] DOB: 1950-07-30 Today's Date: 10/27/2021   History of Present Illness 71 yo male admitted with L side facial and extremity numbness starting 6/7 MRI(+) multifocal acute ischemia R PCA territory involving dorsal thalmas, medial R temporal lobe, R occipial lobe. Code stroke activated 6/12, MRI revealing Increased size of evolving early subacute right PCA distribution infarct.  PMH CVA 2017 glaucoma, HTN    PT Comments    Pt is seen after a code stroke overnight, and did note some improvement from the strength on L side reported by MD last night.  Pt is reluctant to push too much today, did allow PT and OT to get him up and work on his sitting control and strength of L side.  Follow up with him to work on standing as he will tolerate, and transfers to chair as well.  Follow acute PT goals as outlined on POC.  Recommendations for follow up therapy are one component of a multi-disciplinary discharge planning process, led by the attending physician.  Recommendations may be updated based on patient status, additional functional criteria and insurance authorization.  Follow Up Recommendations  Acute inpatient rehab (3hours/day)     Assistance Recommended at Discharge Frequent or constant Supervision/Assistance  Patient can return home with the following Two people to help with walking and/or transfers;A little help with bathing/dressing/bathroom;Assistance with cooking/housework;Assist for transportation;Help with stairs or ramp for entrance   Equipment Recommendations  Other (comment) (determine in rehab setting)    Recommendations for Other Services Rehab consult     Precautions / Restrictions Precautions Precautions: Fall Restrictions Weight Bearing Restrictions: No     Mobility  Bed Mobility Overal bed mobility: Needs Assistance Bed Mobility: Supine to Sit, Sit to Supine Rolling: Mod assist, +2 for physical  assistance, +2 for safety/equipment   Supine to sit: Max assist, +2 for physical assistance, +2 for safety/equipment Sit to supine: Max assist, +2 for physical assistance, +2 for safety/equipment   General bed mobility comments: two therapist support for trunk and LE's, max assist of two for transition up in bed    Transfers                   General transfer comment: declined    Ambulation/Gait                   Stairs             Wheelchair Mobility    Modified Rankin (Stroke Patients Only)       Balance Overall balance assessment: Needs assistance Sitting-balance support: Feet supported, Single extremity supported Sitting balance-Leahy Scale: Fair Sitting balance - Comments: fair once set                                    Cognition Arousal/Alertness: Lethargic Behavior During Therapy: Anxious, Flat affect Overall Cognitive Status: Impaired/Different from baseline Area of Impairment: Problem solving, Awareness, Safety/judgement                         Safety/Judgement: Decreased awareness of safety, Decreased awareness of deficits Awareness: Intellectual Problem Solving: Slow processing, Requires verbal cues, Requires tactile cues General Comments: repetitive instructions esp for LLE placement on floor and sequencing to get set and balanced on side of bed        Exercises General Exercises - Lower Extremity  Ankle Circles/Pumps: AAROM Heel Slides: AAROM Hip ABduction/ADduction: AAROM Hip Flexion/Marching: AAROM Toe Raises: AAROM Other Exercises Other Exercises: practice to WB through L side esp hip and to encourage LLE use    General Comments General comments (skin integrity, edema, etc.): wife in attendance to therapy with minimal interaction today      Pertinent Vitals/Pain Pain Assessment Pain Assessment: No/denies pain    Home Living   Living Arrangements:  (girlfriend of 20 plus years, Vaughan Basta)          Technical brewer of Steps: several            Prior Function            PT Goals (current goals can now be found in the care plan section)      Frequency    Min 4X/week      PT Plan Current plan remains appropriate    Co-evaluation PT/OT/SLP Co-Evaluation/Treatment: Yes Reason for Co-Treatment: Complexity of the patient's impairments (multi-system involvement);For patient/therapist safety PT goals addressed during session: Mobility/safety with mobility;Balance OT goals addressed during session: ADL's and self-care;Strengthening/ROM      AM-PAC PT "6 Clicks" Mobility   Outcome Measure  Help needed turning from your back to your side while in a flat bed without using bedrails?: A Lot Help needed moving from lying on your back to sitting on the side of a flat bed without using bedrails?: Total Help needed moving to and from a bed to a chair (including a wheelchair)?: Total Help needed standing up from a chair using your arms (e.g., wheelchair or bedside chair)?: Total Help needed to walk in hospital room?: Total Help needed climbing 3-5 steps with a railing? : Total 6 Click Score: 7    End of Session Equipment Utilized During Treatment: Gait belt Activity Tolerance: Patient tolerated treatment well;Patient limited by fatigue;Treatment limited secondary to medical complications (Comment) Patient left: in bed;with call bell/phone within reach;with family/visitor present;Other (comment) Nurse Communication: Mobility status PT Visit Diagnosis: Unsteadiness on feet (R26.81);Other abnormalities of gait and mobility (R26.89);Difficulty in walking, not elsewhere classified (R26.2);Other symptoms and signs involving the nervous system (R29.898)     Time: [id number] PT Time Calculation (min) (ACUTE ONLY): 29 min  Charges:  $Therapeutic Activity: 8-22 mins     Ramond Dial 10/27/2021, 4:44 PM  Mee Hives, PT PhD Acute Rehab Dept. Number: [place] and [place]

## 2021-10-27 NOTE — Progress Notes (Addendum)
PHARMACY - PHYSICIAN COMMUNICATION CRITICAL VALUE ALERT - BLOOD CULTURE IDENTIFICATION (BCID)  Roberto [name] is an 71 y.o. male who presented to [facility] on 10/23/2021 with a chief complaint of L sided numbness.  Currently being treated for aspiration PNA.  Assessment:  Blood cultures are growing MRSA and Strep pyogenes (GAS) in 3 of 4 bottles thus far.  Name of physician (or Provider) Contacted: Dr. August Saucer  Current antibiotics: vancomycin and Unasyn  Changes to prescribed antibiotics recommended:  Recommendations accepted by provider  - Continue vanc and narrow Unasyn to Rocephin since also covering for aspiration - Hold off on clinda since not hypotensive, not on pressor - ID auto-consulted with Staph bacteremia, f/u recommendations  Results for orders placed or performed during the hospital encounter of 10/23/21  Blood Culture ID Panel (Reflexed) (Collected: 10/27/2021  4:05 AM)  Result Value Ref Range   Enterococcus faecalis NOT DETECTED NOT DETECTED   Enterococcus Faecium NOT DETECTED NOT DETECTED   Listeria monocytogenes NOT DETECTED NOT DETECTED   Staphylococcus species DETECTED (A) NOT DETECTED   Staphylococcus aureus (BCID) DETECTED (A) NOT DETECTED   Staphylococcus epidermidis NOT DETECTED NOT DETECTED   Staphylococcus lugdunensis NOT DETECTED NOT DETECTED   Streptococcus species DETECTED (A) NOT DETECTED   Streptococcus agalactiae NOT DETECTED NOT DETECTED   Streptococcus pneumoniae NOT DETECTED NOT DETECTED   Streptococcus pyogenes DETECTED (A) NOT DETECTED   A.calcoaceticus-baumannii NOT DETECTED NOT DETECTED   Bacteroides fragilis NOT DETECTED NOT DETECTED   Enterobacterales NOT DETECTED NOT DETECTED   Enterobacter cloacae complex NOT DETECTED NOT DETECTED   Escherichia coli NOT DETECTED NOT DETECTED   Klebsiella aerogenes NOT DETECTED NOT DETECTED   Klebsiella oxytoca NOT DETECTED NOT DETECTED   Klebsiella pneumoniae NOT DETECTED NOT DETECTED   Proteus species NOT  DETECTED NOT DETECTED   Salmonella species NOT DETECTED NOT DETECTED   Serratia marcescens NOT DETECTED NOT DETECTED   Haemophilus influenzae NOT DETECTED NOT DETECTED   Neisseria meningitidis NOT DETECTED NOT DETECTED   Pseudomonas aeruginosa NOT DETECTED NOT DETECTED   Stenotrophomonas maltophilia NOT DETECTED NOT DETECTED   Candida albicans NOT DETECTED NOT DETECTED   Candida auris NOT DETECTED NOT DETECTED   Candida glabrata NOT DETECTED NOT DETECTED   Candida krusei NOT DETECTED NOT DETECTED   Candida parapsilosis NOT DETECTED NOT DETECTED   Candida tropicalis NOT DETECTED NOT DETECTED   Cryptococcus neoformans/gattii NOT DETECTED NOT DETECTED   Meth resistant mecA/C and MREJ DETECTED (A) NOT DETECTED    Roberto [name] D. Laney Potash, PharmD, BCPS, BCCCP 10/27/2021, 5:55 PM

## 2021-10-27 NOTE — Progress Notes (Signed)
HD#3 SUBJECTIVE:  Patient Summary: Roberto [name] is a 71 y.o. with a pertinent PMH of CVA in 2017, who presented with L sided numbness and admitted for R PCA infarct.   Overnight Events: Patient has change in mental status and increased neurological deficit on exam, on L side of his body. CODE STROKE was called and repeat CT head, CTA were obtained. He received 1.5L IVF.  Interim History: Patient complains this morning of a 7.5/10 headache over his R eye and is requesting something for the pain and a drink. He feels worse this morning and that his symptoms are progressing since when he was initially admitted.  OBJECTIVE:  Vital Signs: Vitals:   10/27/21 0500 10/27/21 0543 10/27/21 0621 10/27/21 0749  BP:  105/70 114/71 121/67  Pulse:  98 97 95  Resp:  (!) 22 18 (!) 22  Temp:  100.1 F (37.8 C)  99.9 F (37.7 C)  TempSrc:  Oral  Oral  SpO2:  92% 91% 93%  Weight:      Height: 5\' 6"  (1.676 m)      Supplemental O2: Room Air SpO2: 93 % O2 Flow Rate (L/min): 2 L/min  Filed Weights   10/23/21 1200 10/24/21 0316  Weight: 81.6 kg 77.1 kg     Intake/Output Summary (Last 24 hours) at 10/27/2021 0950 Last data filed at 10/27/2021 0550 Gross per 24 hour  Intake 1077.52 ml  Output 900 ml  Net 177.52 ml   Net IO Since Admission: -281.94 mL [10/27/21 0950]  Physical Exam: Constitutional:Somnolent gentleman in no acute distress. Cardio:Regular rate and rhythm. No murmurs, rubs, gallops. Pulm:Clear to auscultation bilaterally. Patient has a productive cough. Abdomen:Soft, nondistended. QF:[id number] for extremity edema. Skin:Skin is warm and dry. There is a new large blister on lateral aspect of R 5th finger. Neuro: Mental Status: Patient is somnolent but oriented to self, place, and time. Has LUE and new LLE neglect. Cranial Nerves: II: Pupils equal, round, and reactive to light.   III,IV, VI: Unable to track with eyes only. V: Facial sensation decreased on L. VII: Facial  movement is symmetric.  VIII: Hearing is intact to voice Motor: RUE and RLE 4/5; LUE and LLE 0/5.  Sensory: Sensation is intact on R, though at one point he states his knee is being touched when it is his foot. Patient denies sensation on the L. Cerebellar: Finger-Nose on R not intact. Unable to assess on L.    Patient Lines/Drains/Airways Status     Active Line/Drains/Airways     Name Placement date Placement time Site Days   Peripheral IV 10/23/21 20 G Anterior;Distal;Left;Upper Arm 10/23/21  --  Arm  4   External Urinary Catheter 10/26/21  --  --  1             ASSESSMENT/PLAN:  Assessment: Principal Problem:   CVA (cerebral vascular accident) [facility]) Active Problems:   Essential hypertension   Aspiration pneumonia ([place])   Erythrocytosis   Hyponatremia   Acute kidney injury ([place])   Plan: #R PCA Infarct Unfortunately Roberto [name]' deficits continue to progress. CODE STROKE overnight 06/12 with CT head showing increased size of evolving subacute R PCA infarct without hemorrhagic transformation or significant regional mass effect. CTA head at this time showed occlusion of proximal R P2 segment, moderate proximal L P2 stenosis, mild-moderate atheromatous change about the carotid bifurcations and carotid siphons. Likely that his acute progression was related to relative hypotension compared to his baseline, rather than new thrombosis. -Neurology following, appreciate  their assistance             -Now enrolled in clinical trial -SCDs only for VTE prophylaxis -Not a TNK candidate or thrombectomy candidate per most recent note -Will need to have loop recorder placed prior to discharge to assess for a fib -Continue atorvastatin 80 mg daily -Continue ASA 81 mg daily x 3 weeks -Continue Plavix 75 mg daily -Lifestyle modifications for pre-diabetes and hypertriglyceridemia    #Erythrocytosis Hgb has increased further to 18.8. Given thrombotic event in conjunction with erythrocytosis  I am worried about a myeloproliferative neoplasm. EPO was normal. I will seek neurology's opinion re: therapeutic phlebotomy. -F/u JAK2, smear -Trend CBC  #Leukocytosis #Elevated temperature Patient has a new leukocytosis of 27.4 from 10.1 06/12. He had an elevated temperature overnight to 100.66F with hypotension and tachycardia, as well as a new O2 requirement of 2L [place]. CXR at that time did not show any acute abnormalities. Blood cultures were drawn and he was initiated on ceftriaxone, metronidazole, and vancomycin. He has a new cough this morning that is productive as well. At this time I am most concerned for aspiration event given altered mentation and recent CVA that has resulted in progression of neurological deficits each day. -SLP consulted with recommendation for dysphagia 3 diet -Repeat CXR -F/u blood cultures -Trend CBC, fever curve -De-escalate to unasyn  #Acute kidney injury Likely secondary to hypotensive episode overnight. Scr 1.50 from 0.99. He is s/p 1.5 L IVF overnight. -Avoid nephrotoxic agents -Trend BMP   #Hypertension Patient was hypotensive overnight, with stable BP on exam this morning. He did receive 1.5 L IVF. Losartan dose for today has been held. -Restart losartan 50 mg daily if indicated.  Best Practice: Diet: Dysphagia 3 IVF: None VTE: Place and maintain sequential compression device Start: 10/26/21 1121 Code: Full AB: Unasyn Therapy Recs: CIR Family Contact: Misty [phone]), called and notified. DISPO: Anticipated discharge pending Medical stability and Insurance for SNF coverage.  Signature: Farrel Gordon, D.O.  Internal Medicine Resident, PGY-1 [facility]  Pager: [phone] 9:50 AM, 10/27/2021   Please contact the on call pager after 5 pm and on weekends at [phone].

## 2021-10-27 NOTE — PMR Pre-admission (Shared)
PMR Admission Coordinator Pre-Admission Assessment  Patient: Roberto [name] is an 71 y.o., male MRN: [id number] DOB: 16-Dec-1950 Height: 5' 6" (167.6 cm) Weight: 77.1 kg  Insurance Information HMO:     PPO:      PCP:      IPA:      80/20:      OTHER:  PRIMARY: [employer]      Policy#: [id number]      Subscriber: pt CM Name: Mickel Baas      Phone#: [phone] ext [phone]     Fax#: [phone] Pre-Cert#: [id number] approved form 6/14 until 6/21 with updates due 6/20      Employer: [employer] Benefits:  Phone #: [phone]     Name: 6/12 Eff. Date: 11/14/2020     Deduct: $1500      Out of Pocket Max: $5500      CIR: 60%      SNF: 60% 60 day limit Outpatient: $25 co pay per visit     Co-Pay: 20 combined visits Home Health: $25 per visit      Co-Pay: 60 combined visits DME: 60%     Co-Pay: 40% Providers: in network  SECONDARY: Medicare      Policy#: ***       Financial Counselor:       Phone#:   The Therapist, art Information Summary" for patients in Inpatient Rehabilitation Facilities with attached "Privacy Act [place] Records" was provided and verbally reviewed with: Patient and Family  Emergency Contact Information Contact Information     Name Relation Home Work Mobile   [place] Brother [phone]     Jairo Ben Granddaughter   [place]   [phone]      Current Medical History  Patient Admitting Diagnosis: CVA  History of Present Illness: 71 year old male with history of CVA/TIA 2017, HTN, glaucoma and gout. Presented on 10/23/21 with left sided weakness. Was supposed to be on ASA but was not taking it.   Neurology consulted. MRI multifocal ischemic changes in the right PCA territory incovlvine dorsal thalamus, medial right temporal lobe and right occipital lobe. CTA limited by poor bolus timing of venous contamination. Approximate 75 % stenosis in the  proximal left ICA. Evaluation of distal intracranial vasculature severely  limited. Worsening neuro exam on 6/10 . Repeat CT head with expected evolutionary changes in the right PCA infarct with no hemorrhagic conversion or acute change. ECHO unremarkable. Plan DAPT for 3 weeks and then Plavix alone. Continue high dose statin . Allow permissive HTN then start losartan. Lovenox for DVT prophylaxis. Patient is participating in the [facility] stroke prevention trial ( standard antiplatelet therapy plus factor XI inhibitor Asundexian versus standard medical therapy plus placebo ). Will need LOOP prior to discharge to rehab. On 6/12 with acute Neuro changes. Given 1.5 liters IVF. Repeat CT showing increased size of evolving subacute R PCA infarct without hemorrhagic transformation or significant regional mass effect. Felt acute progression related to relative hypotension compared to his baseline.   Erythrocytosis. EPO normal Felt given thrombotic event in conjunction with erythrocytosis felt need to rule out myeloproliferative neoplasm. Will ask if needed therapeutic phlebotomy and trend CBC.  Leukocytosis on 6/12 with elevated temp. Blood cultures drawn and initiated ceftriaxone, metronidazole and vancomycin. Concern for aspiration event. SLP evaluated for place on Dysphagia 3 diet. Deescalated to Unasyn only. Creat elevated felt secondary to hypotension. Avoid nephrotoxins agents and trend BMP.  Complete NIHSS TOTAL: 13  Patient's medical record from [facility]  has been reviewed by the rehabilitation admission coordinator and physician.  Past Medical History  Past Medical History:  Diagnosis Date   Anemia    Glaucoma    Hypertension    Has the patient had major surgery during 100 days prior to admission? No  Family History   family history includes Heart disease in his mother; Stroke in his father.  Current Medications  Current Facility-Administered Medications:    acetaminophen (TYLENOL) tablet 1,000 mg, 1,000 mg, Oral, Q6H PRN, Garvin Fila, MD, 1,000 mg at  10/25/21 1613   acetaminophen-codeine (TYLENOL #3) 300-30 MG per tablet 2 tablet, 2 tablet, Oral, Q6H PRN, Garvin Fila, MD, 2 tablet at 10/26/21 1506   Ampicillin-Sulbactam (UNASYN) 3 g in sodium chloride 0.9 % 100 mL IVPB, 3 g, Intravenous, Q8H, Skeet Simmer, RPH, Last Rate: 200 mL/hr at 10/27/21 1149, 3 g at 10/27/21 1149   aspirin chewable tablet 81 mg, 81 mg, Oral, Daily, Sanjuan Dame, MD, 81 mg at 10/27/21 1030   atorvastatin (LIPITOR) tablet 80 mg, 80 mg, Oral, Daily, Farrel Gordon, DO, 80 mg at 10/27/21 1030   calamine lotion 1 application , 1 application , Topical, TID PRN, Ileene Musa, Alexa, MD, 1 application  at 27/25/36 1508   clopidogrel (PLAVIX) tablet 75 mg, 75 mg, Oral, Daily, Sanjuan Dame, MD, 75 mg at 10/27/21 1030   melatonin tablet 3 mg, 3 mg, Oral, QHS, Demaio, Alexa, MD, 3 mg at 10/26/21 2208   polyethylene glycol (MIRALAX / GLYCOLAX) packet 17 g, 17 g, Oral, Daily PRN, Farrel Gordon, DO   senna-docusate (Senokot-S) tablet 1 tablet, 1 tablet, Oral, BID, Sanjuan Dame, MD, 1 tablet at 10/27/21 1030   Study - OCEANIC-STROKE - asundexian 50 mg or placebo tablet (PI-Sethi), 1 tablet, Oral, Daily, Leonie Man, Pramod S, MD, 50 mg at 10/27/21 1031   vancomycin variable dose per unstable renal function (pharmacist dosing), , Does not apply, See admin instructions, Laren Everts, RPH  Patients Current Diet:  Diet Order             DIET DYS 3 Room service appropriate? No; Fluid consistency: Thin  Diet effective now                  Precautions / Restrictions Precautions Precautions: Fall Restrictions Weight Bearing Restrictions: No   Has the patient had 2 or more falls or a fall with injury in the past year? No  Prior Activity Level Community (5-7x/wk): independent, driving and working  Prior Functional Level Self Care: Did the patient need help bathing, dressing, using the toilet or eating? Independent  Indoor Mobility: Did the patient need assistance with  walking from room to room (with or without device)? Independent  Stairs: Did the patient need assistance with internal or external stairs (with or without device)? Independent  Functional Cognition: Did the patient need help planning regular tasks such as shopping or remembering to take medications? Independent  Patient Information Are you of Hispanic, Latino/a,or Spanish origin?: A. No, not of Hispanic, Latino/a, or Spanish origin What is your race?: A. White Do you need or want an interpreter to communicate with a doctor or health care staff?: 0. No  Patient's Response To:  Health Literacy and Transportation Is the patient able to respond to health literacy and transportation needs?: Yes Health Literacy - How often do you need to have someone help you when you read instructions, pamphlets, or other written material from your doctor or pharmacy?: Never In the past 12  months, has lack of transportation kept you from medical appointments or from getting medications?: No In the past 12 months, has lack of transportation kept you from meetings, work, or from getting things needed for daily living?: No  Development worker, international aid / [place]: Kasandra Knudsen - single point, Shower seat (wife has w/c)  Prior Device Use: Indicate devices/aids used by the patient prior to current illness, exacerbation or injury? None of the above  Current Functional Level Cognition  Overall Cognitive Status: Impaired/Different from baseline Orientation Level: Oriented to person, Oriented to place, Oriented to situation, Disoriented to time Safety/Judgement: Decreased awareness of safety, Decreased awareness of deficits General Comments: requires repetitive information for balance and hand placement    Extremity Assessment (includes Sensation/Coordination)  Upper Extremity Assessment: LUE deficits/detail LUE Deficits / Details: trace grasp noted in LUE, no active movement in L shoulder, elbow, wrist. PROM  WFL LUE Sensation: decreased light touch, decreased proprioception LUE Coordination: decreased fine motor, decreased gross motor  Lower Extremity Assessment: Defer to PT evaluation LLE Deficits / Details: incoordination, also antalgic appearing due to "pins and needles" sensation. General weakness LLE Sensation: decreased light touch LLE Coordination: decreased fine motor    ADLs  Overall ADL's : Needs assistance/impaired Eating/Feeding: Minimal assistance, Bed level Eating/Feeding Details (indicate cue type and reason): eating R side of plate Upper Body Dressing : Maximal assistance, Sitting, Total assistance Upper Body Dressing Details (indicate cue type and reason): to don gown Toilet Transfer: Minimal assistance, Stand-pivot General ADL Comments: pt with decreased awareness to the L side of body and needs cues to attend to L UE. pt progressing from bed to chair this session with max cues    Mobility  Overal bed mobility: Needs Assistance Bed Mobility: Supine to Sit, Sit to Supine, Rolling Rolling: Mod assist, +2 for physical assistance Supine to sit: Max assist, +2 for physical assistance Sit to supine: Max assist, +2 for physical assistance General bed mobility comments: mod A for trunk support, fatiguing quickly    Transfers  Overall transfer level: Needs assistance Equipment used: None Transfers: Sit to/from Stand Sit to Stand: Min assist General transfer comment: pt declining    Ambulation / Gait / Stairs / Emergency planning/management officer  Ambulation/Gait Ambulation/Gait assistance: Max Web designer (Feet): 8 Feet Assistive device: 1 person hand held assist Gait Pattern/deviations: Step-to pattern General Gait Details: pt declined to attempt Gait velocity interpretation: <1.31 ft/sec, indicative of household ambulator    Posture / Balance Balance Overall balance assessment: Needs assistance Sitting-balance support: Feet supported, Single extremity supported Sitting  balance-Leahy Scale: Fair Standing balance support: Bilateral upper extremity supported, During functional activity Standing balance-Leahy Scale: Poor Standing balance comment: unable to get pt up to stand    Special needs/care consideration    Previous Home Environment  Living Arrangements:  (girlfriend of 20 plus years, Vaughan Basta)  Lives With: Significant other Available Help at Discharge: Available 24 hours/day, Family Type of Home: House Home Layout: One level Home Access: Stairs to enter Entrance Stairs-Rails: Left, Right Entrance Stairs-Number of Steps: several Bathroom Shower/Tub: Government social research officer Accessibility: Yes Home Care Services: No Additional Comments: girlfriend present in room  Discharge Living Setting Plans for Discharge Living Setting: Patient's home, Lives with (comment) (girlfriend, Vaughan Basta) Type of Home at Discharge: House Discharge Home Layout: One level Discharge Home Access: Stairs to enter Entrance Stairs-Rails: Right, Left Entrance Stairs-Number of Steps: several Discharge Bathroom Shower/Tub: Tub/shower unit Discharge Bathroom Toilet: Standard Discharge Bathroom Accessibility: Yes How  Accessible: Accessible via walker Does the patient have any problems obtaining your medications?: No  Social/Family/Support Systems Patient Roles: Partner (employee) Contact Information: girlfrient, Vaughan Basta and brother, Patsy [place] Anticipated Caregiver: girlfriend and family Anticipated Caregiver's Contact Information: see contacts Ability/Limitations of Caregiver: Vaughan Basta can not lift . He needs to be ambulatory Caregiver Availability: 24/7 Discharge Plan Discussed with Primary Caregiver: Yes Is Caregiver In Agreement with Plan?: Yes Does Caregiver/Family have Issues with Lodging/Transportation while Pt is in Rehab?: No  Goals Patient/Family Goal for Rehab: supervision PT, supervision to Min OT, supervision SLP Expected length of stay: ELOS  14 to 20 days Pt/Family Agrees to Admission and willing to participate: Yes Program Orientation Provided & Reviewed with Pt/Caregiver Including Roles  & Responsibilities: Yes  Decrease burden of Care through IP rehab admission: n/a  Possible need for SNF placement upon discharge: not anticipated  Patient Condition: I have reviewed medical records from [facility], spoken with CM, and patient and family member. I met with patient at the bedside for inpatient rehabilitation assessment.  Patient will benefit from ongoing PT, OT, and SLP, can actively participate in 3 hours of therapy a day 5 days of the week, and can make measurable gains during the admission.  Patient will also benefit from the coordinated team approach during an Inpatient Acute Rehabilitation admission.  The patient will receive intensive therapy as well as Rehabilitation physician, nursing, social worker, and care management interventions.  Due to bladder management, bowel management, safety, skin/wound care, disease management, medication administration, pain management, and patient education the patient requires 24 hour a day rehabilitation nursing.  The patient is currently *** with mobility and basic ADLs.  Discharge setting and therapy post discharge at home with home health is anticipated.  Patient has agreed to participate in the Acute Inpatient Rehabilitation Program and will admit today.  Preadmission Screen Completed By:  Cleatrice Burke, 10/27/2021 1:52 PM ______________________________________________________________________   Discussed status with Dr. Marland Kitchen on *** at *** and received approval for admission today.  Admission Coordinator:  Cleatrice Burke, RN, time Marland KitchenSudie Grumbling ***   Assessment/Plan: Diagnosis: Does the need for close, 24 hr/day Medical supervision in concert with the patient's rehab needs make it unreasonable for this patient to be served in a less intensive setting?  {yes_no_potentially:3041433} Co-Morbidities requiring supervision/potential complications: *** Due to {due EP:3295188}, does the patient require 24 hr/day rehab nursing? {yes_no_potentially:3041433} Does the patient require coordinated care of a physician, rehab nurse, PT, OT, and SLP to address physical and functional deficits in the context of the above medical diagnosis(es)? {yes_no_potentially:3041433} Addressing deficits in the following areas: {deficits:3041436} Can the patient actively participate in an intensive therapy program of at least 3 hrs of therapy 5 days a week? {yes_no_potentially:3041433} The potential for patient to make measurable gains while on inpatient rehab is {potential:3041437} Anticipated functional outcomes upon discharge from inpatient rehab: {functional outcomes:304600100} PT, {functional outcomes:304600100} OT, {functional outcomes:304600100} SLP Estimated rehab length of stay to reach the above functional goals is: *** Anticipated discharge destination: {anticipated dc setting:21604} 10. Overall Rehab/Functional Prognosis: {potential:3041437}   MD Signature: ***

## 2021-10-27 NOTE — Progress Notes (Signed)
Stroke Team Progress Note  SUBJECTIVE He had apparent worsening last night and a code stroke was called.  It appears that he is back to his baseline.  There is concern for possible sepsis.  He had a temperature 100.1 overnight.  His white count is 27.  He is being pancultured.  He is otherwise unchanged.  OBJECTIVE Most recent Vital Signs: Temp: 99.5 F (37.5 C) (06/13 1149) Temp Source: Oral (06/13 1149) BP: 136/80 (06/13 1149) Pulse Rate: 99 (06/13 1149) Respiratory Rate: (!) 23 O2 Saturdation: 92%  CBG (last 3)  Recent Labs    10/26/21 1922  GLUCAP 139*        Studies:  CT head 10/24/2021 expected evolutionary changes in the right PCA infarct no hemorrhagic conversion or acute change. MRI brain without contrast multifocal ischemic changes in the right PCA territory involving dorsal thalamus, medial right temporal lobe and right occipital lobe.  No hemorrhage or mass effect.   CTA evaluation is limited by poor bolus timing of venous contamination.  Approximate 75% stenosis in proximal left ICA.  Evaluation of distal intracranial vasculature is severely limited. CT perfusion no core infarct but decreased perfusion in bilateral cerebellar and cerebral hemispheres likely artifactual. ECHO 60-65%. LDL 52 mg percent HbA1c 5.8 Physical Exam:    [name] laying in bed with his wife sitting next to him  Neurological Exam ; Awake alert oriented to time place and person.  Speech is slightly dysarthric.  Extraocular movements are full range without nystagmus.   left homonymous hemianopsia.  Symmetric facial movements without weakness.  Facial sensation intact..   Motor: no drift in right UE/LE.  Left leg: 2/5. Left arm 0/5 distally, 2/5 proximally. Sensory: Diminished left hemibody touch light touch. Reflexes are symmetric.  Plantars are downgoing.    NIH stroke scale 3. Premorbid modified Rankin scale 0 ASSESSMENT 71 year old Caucasian male right PCA infarct  of embolic origin from cryptogenic source.  outside time window for thrombolysis and CT angiogram did not show any LVO. Vascular risk factors of hypertension, hyperlipidemia, extracranial ICA stenosis.  Recent code stroke called yesterday likely recurrence of old stroke symptoms in setting of fever and possible sepsis. Primary doing infectious workup.Will check MRI as he is in a trail medication.    Hospital day # 3  Recommendation: -Con't  [place] stroke prevention trial  (standard antiplatelet therapy plus factor XI inhibitor Asundexian  versus standard medical therapy plus placebo.  -MRI to eval for new CVAs. -Loop recorder on discharge.    Total of 25 mins spent reviewing chart, discussion with patient and family on prognosis, Dx and plan. Discussed case with patient's nurse. Reviewed Imaging personally.    10/27/2021 1:57 PM

## 2021-10-27 NOTE — Progress Notes (Signed)
Pharmacy Antibiotic Note  Roberto [name] is a 71 y.o. male admitted on 10/23/2021, now with concern for sepsis.  Pharmacy has been consulted for vancomycin dosing.  Concern for AKI w/ baseline SCr <1 and now 1.5.  Plan: Vancomycin 1500mg  IV x1; monitor SCr +/- vanc levels prior to redosing. Cefepime and metronidazole ordered by MD.  Height: 5\' 6"  (167.6 cm) Weight: 77.1 kg (169 lb 15.6 oz) IBW/kg (Calculated) : 63.8  Temp (24hrs), Avg:98.8 F (37.1 C), Min:97.7 F (36.5 C), Max:99.4 F (37.4 C)  Recent Labs  Lab 10/23/21 1249 10/24/21 0318 10/25/21 0424 10/26/21 0402 10/27/21 0241 10/27/21 0353  WBC  --  8.3 11.5* 10.1 27.2* 27.4*  CREATININE 0.90 0.90 0.82 0.99 1.50*  --   LATICACIDVEN  --   --   --   --   --  1.7    Estimated Creatinine Clearance: 44.1 mL/min (A) (by C-G formula based on SCr of 1.5 mg/dL (H)).    No Known Allergies   Thank you for allowing pharmacy to be a part of this patient's care.  10/29/21, PharmD, BCPS  10/27/2021 5:39 AM

## 2021-10-27 NOTE — Progress Notes (Signed)
Pt's WBC read 27.2, pt a bit sweaty with a temp of 99.4,Dr Katsadouros (on call) notified, new orders placed, same started, pt and family reassured, will continue to monitor. Obasogie-Asidi, Jeevan Kalla Efe

## 2021-10-27 NOTE — Progress Notes (Signed)
Pharmacy Antibiotic Note  Roberto [name] is a 71 y.o. male started on empiric antibiotics this am for sepsis, unknown source.  Vancomycin, Cefepime and Metronidazole given. Cefepime/Metronidazole changing to Unasyn for aspiration pneumonia coverage. Pharmacy has been consulted for Vancomycin and Unasyn dosing.   Creatinine up to 1.5 today, IVF given.  Baseline 0.8-1.0.  Plan: Vancomycin 1500 mg IV x 1 given at 0830 this am. No standing regimen yet. Anticipating q36hr dosing interval with Scr up, or q24h if SCr returns to baseline. Unasyn 3 gm IV q8h. Follow renal function, culture data, clinical progress and antibiotic plans.  Height: 5\' 6"  (167.6 cm) Weight: 77.1 kg (169 lb 15.6 oz) IBW/kg (Calculated) : 63.8  Temp (24hrs), Avg:99.3 F (37.4 C), Min:98.6 F (37 C), Max:100.1 F (37.8 C)  Recent Labs  Lab 10/23/21 1249 10/24/21 0318 10/25/21 0424 10/26/21 0402 10/27/21 0241 10/27/21 0353 10/27/21 0609  WBC  --  8.3 11.5* 10.1 27.2* 27.4*  --   CREATININE 0.90 0.90 0.82 0.99 1.50*  --   --   LATICACIDVEN  --   --   --   --   --  1.7 1.2    Estimated Creatinine Clearance: 44.1 mL/min (A) (by C-G formula based on SCr of 1.5 mg/dL (H)).    No Known Allergies  Antimicrobials this admission:  Vancomycin 6/13>>  Cefepime x 1 on 6/13  Metronidazole x 1 on 6/13   Unasyn 6/13 >>  Dose adjustments this admission:  n/a  Microbiology results:  6/13 blood: sent  Thank you for allowing pharmacy to be a part of this patient's care.  7/13, RPh 10/27/2021 11:04 AM

## 2021-10-27 NOTE — Progress Notes (Signed)
  Inpatient Rehabilitation Admissions Coordinator   I met with patient and his girlfriend, Vaughan Basta at bedside. Noted overnight medical issues. I have begun insurance  approval with [employer] for CIR admit, but unless he is able to participate more with therapy, he may need SNF. I will follow his progress.  Danne Baxter, RN, MSN Rehab Admissions Coordinator [phone] 10/27/2021 10:50 AM

## 2021-10-28 ENCOUNTER — Inpatient Hospital Stay (HOSPITAL_COMMUNITY): Payer: PRIVATE HEALTH INSURANCE

## 2021-10-28 DIAGNOSIS — B9562 Methicillin resistant Staphylococcus aureus infection as the cause of diseases classified elsewhere: Secondary | ICD-10-CM | POA: Diagnosis not present

## 2021-10-28 DIAGNOSIS — I10 Essential (primary) hypertension: Secondary | ICD-10-CM | POA: Diagnosis not present

## 2021-10-28 DIAGNOSIS — N179 Acute kidney failure, unspecified: Secondary | ICD-10-CM

## 2021-10-28 DIAGNOSIS — Z8673 Personal history of transient ischemic attack (TIA), and cerebral infarction without residual deficits: Secondary | ICD-10-CM

## 2021-10-28 DIAGNOSIS — I639 Cerebral infarction, unspecified: Secondary | ICD-10-CM

## 2021-10-28 DIAGNOSIS — B954 Other streptococcus as the cause of diseases classified elsewhere: Secondary | ICD-10-CM

## 2021-10-28 DIAGNOSIS — D72829 Elevated white blood cell count, unspecified: Secondary | ICD-10-CM

## 2021-10-28 DIAGNOSIS — I63431 Cerebral infarction due to embolism of right posterior cerebral artery: Secondary | ICD-10-CM | POA: Diagnosis not present

## 2021-10-28 DIAGNOSIS — R7881 Bacteremia: Secondary | ICD-10-CM | POA: Diagnosis not present

## 2021-10-28 LAB — BASIC METABOLIC PANEL
Anion gap: 8 (ref 5–15)
BUN: 28 mg/dL — ABNORMAL HIGH (ref 8–23)
CO2: 17 mmol/L — ABNORMAL LOW (ref 22–32)
Calcium: 8.2 mg/dL — ABNORMAL LOW (ref 8.9–10.3)
Chloride: 104 mmol/L (ref 98–111)
Creatinine, Ser: 1.04 mg/dL (ref 0.61–1.24)
GFR, Estimated: >60 mL/min (ref >60)
Glucose, Bld: 120 mg/dL — ABNORMAL HIGH (ref 70–99)
Potassium: 3.6 mmol/L (ref 3.5–5.1)
Sodium: 129 mmol/L — ABNORMAL LOW (ref 135–145)

## 2021-10-28 LAB — CBC
HCT: 48.4 % (ref 39.0–52.0)
Hemoglobin: 16.8 g/dL (ref 13.0–17.0)
MCH: 32.2 pg (ref 26.0–34.0)
MCHC: 34.7 g/dL (ref 30.0–36.0)
MCV: 92.7 fL (ref 80.0–100.0)
Platelets: 162 10*3/uL (ref 150–400)
RBC: 5.22 MIL/uL (ref 4.22–5.81)
RDW: 13 % (ref 11.5–15.5)
WBC: 11.1 10*3/uL — ABNORMAL HIGH (ref 4.0–10.5)
nRBC: 0 % (ref 0.0–0.2)

## 2021-10-28 LAB — C DIFFICILE QUICK SCREEN W PCR REFLEX
C Diff antigen: NEGATIVE
C Diff interpretation: NOT DETECTED
C Diff toxin: NEGATIVE

## 2021-10-28 MED ORDER — VANCOMYCIN HCL 1500 MG/300ML IV SOLN
1500.0000 mg | INTRAVENOUS | Status: DC
  Administered 2021-10-28 – 2021-11-01 (×5): 1500 mg via INTRAVENOUS
  Filled 2021-10-28 (×5): qty 300

## 2021-10-28 MED ORDER — LOPERAMIDE HCL 2 MG PO CAPS
2.0000 mg | ORAL_CAPSULE | ORAL | Status: DC | PRN
  Administered 2021-10-28 – 2021-11-02 (×4): 2 mg via ORAL
  Filled 2021-10-28 (×4): qty 1

## 2021-10-28 MED ORDER — CLINDAMYCIN PHOSPHATE 600 MG/50ML IV SOLN
600.0000 mg | Freq: Three times a day (TID) | INTRAVENOUS | Status: AC
  Administered 2021-10-28 – 2021-10-30 (×6): 600 mg via INTRAVENOUS
  Filled 2021-10-28 (×6): qty 50

## 2021-10-28 NOTE — H&P (View-Only) (Signed)
[facility] for Infectious Disease    Date of Admission:  10/23/2021   Total days of inpatient antibiotics 2        Reason for Consult: Polymicrobial     Principal Problem:   CVA (cerebral vascular accident) [facility]) Active Problems:   Essential hypertension   Aspiration pneumonia ([place])   Erythrocytosis   Hyponatremia   Acute kidney injury ([place])   Assessment: 44 YM who is a [place] admitted for CVA, recent hx of contact dermatitis due to poison IV.  #Hospital associated MRSA  and Strep pyogenes bacteremia #Right 5th finger tenderness #Poor dentition #Hx of recent contact dermatitis(R fore-arm) due to poison IV #CVA -No signs of overt phlebitis(mild erythema in antecubital) noted but he did develop a significant leukocytosis on day 4 of hospitalization. R PIV(6/9-6/13). More likely he was bacteremic coming in. He had poison IV about a week ago (Monday), which he has been scratching, mostly on right forearm. He could have introduced bacteremia through those wounds. Noted to have tender right 5 th finger and poor dentition.  -TTE showed no vegetation Recommendations:  -D/C ceftriaxone -Continue vancomycin -Add Clindamycin x 48h -Tee -Repeat cx -Orthopantogram -Right hand MRI Microbiology:   Antibiotics: Cefepime 6/11-6/12 Metronidazole 6/12 Unasyn 6/13 Ceftriaxone 6/13-p Vancomycin 6/13-p  Cultures: Blood 6/13 MRSA and strep pyogenes    HPI: Roberto [name] is a 71 y.o. [name] with pertinent PMH of CVA admitted for CVA. He had left sided facial and extremity numbness x 2days. CTA showed left ICA 75% stenosis and decreased perfusion of b/l cerebral and cerebellar hemisphere. MRI brain showed multifocal acute ischemia with right PCA territory, involving dorsal thalamus, medial right temporal lobe and right occipital lobe. MRI today showed interval worsening  of R PCA infarct. ID engaged as pt wbc trended to 27K, blood Cx+ MRSA and Strep pyogenes.     Review of Systems: ROS  Past Medical History:  Diagnosis Date   Anemia    Glaucoma    Hypertension     Social History   Tobacco Use   Smoking status: Never   Smokeless tobacco: Never  Substance Use Topics   Alcohol use: Yes    Alcohol/week: 1.0 standard drink of alcohol    Types: 1 Cans of beer per week    Comment: daily   Drug use: No    Family History  Problem Relation Age of Onset   Heart disease Mother    Stroke Father    Scheduled Meds:  aspirin  81 mg Oral Daily   atorvastatin  80 mg Oral Daily   clopidogrel  75 mg Oral Daily   melatonin  3 mg Oral QHS   OCEANIC-STROKE asundexian or placebo  1 tablet Oral Daily   vancomycin variable dose per unstable renal function (pharmacist dosing)   Does not apply See admin instructions   Continuous Infusions:  cefTRIAXone (ROCEPHIN)  IV 2 g (10/27/21 2111)   PRN Meds:.acetaminophen, acetaminophen-codeine, calamine No Known Allergies  OBJECTIVE: Blood pressure 121/62, pulse 87, temperature 97.8 F (36.6 C), temperature source Oral, resp. rate 18, height 5\' 6"  (1.676 m), weight 77.1 kg, SpO2 94 %.  Physical Exam Constitutional:      General: He is not in acute distress.    Appearance: He is normal weight. He is not toxic-appearing.  HENT:     Head: Normocephalic and atraumatic.     Right Ear: External ear normal.     Left Ear: External ear  normal.     Nose: No congestion or rhinorrhea.     Mouth/Throat:     Mouth: Mucous membranes are moist.     Pharynx: Oropharynx is clear.     Comments: Multiple dental carries Eyes:     Extraocular Movements: Extraocular movements intact.     Conjunctiva/sclera: Conjunctivae normal.     Pupils: Pupils are equal, round, and reactive to light.  Cardiovascular:     Rate and Rhythm: Normal rate and regular rhythm.     Heart sounds: No murmur heard.    No friction rub. No gallop.  Pulmonary:     Effort: Pulmonary effort is normal.     Breath sounds: Normal breath  sounds.  Abdominal:     General: Abdomen is flat. Bowel sounds are normal.     Palpations: Abdomen is soft.  Musculoskeletal:        General: No swelling.     Cervical back: Normal range of motion and neck supple.     Comments: Right pinky finger with limited movement.   Skin:    General: Skin is warm and dry.     Comments: Right forearm rash  Neurological:     General: No focal deficit present.     Mental Status: He is oriented to person, place, and time.  Psychiatric:        Mood and Affect: Mood normal.      Lab Results Lab Results  Component Value Date   WBC 11.1 (H) 10/28/2021   HGB 16.8 10/28/2021   HCT 48.4 10/28/2021   MCV 92.7 10/28/2021   PLT 162 10/28/2021    Lab Results  Component Value Date   CREATININE 1.04 10/28/2021   BUN 28 (H) 10/28/2021   NA 129 (L) 10/28/2021   K 3.6 10/28/2021   CL 104 10/28/2021   CO2 17 (L) 10/28/2021    Lab Results  Component Value Date   ALT 24 10/27/2021   AST 23 10/27/2021   ALKPHOS 63 10/27/2021   BILITOT 1.4 (H) 10/27/2021       Laurice Record, MD [facility] for Infectious [facility] 10/28/2021, 10:28 AM

## 2021-10-28 NOTE — Progress Notes (Signed)
Inpatient Rehabilitation Admissions Coordinator   I met with patient and his girlfriend, Vaughan Basta, at bedside. I explained that patient will need to be able to participate/tolerate more with therapy before I can consider for CIR admit. He has been reluctant to try transfers with therapy. If he continues to need slow paced rehab, I would recommend SNF rehab for which I discussed with Vaughan Basta. TOC RN CM, Kelli, made aware that SNF may need to be considered for prolonged , slower paced rehab.  Danne Baxter, RN, MSN Rehab Admissions Coordinator [phone] 10/28/2021 11:33 AM

## 2021-10-28 NOTE — Progress Notes (Signed)
HD#4 SUBJECTIVE:  Patient Summary: Roberto [name] is a 71 y.o. with a pertinent PMH of CVA in 2017, who presented with L sided numbness and admitted for R PCA infarct now complicated by MRSA and strep pyogenes bacteremia.  Overnight Events: Blood culture resulted positive for MRSA and strep pyogenes bacteremia; antibiotic regimen was adjusted to ceftriaxone and vancomycin to continue coverage for aspiration pneumonia. C. Diff was also tested after large volume diarrhea which was negative.  Interim History: Patient is overall feeling well today but bothered by large volumes of diarrhea and persistence of L sided numbness.  OBJECTIVE:  Vital Signs: Vitals:   10/27/21 1521 10/27/21 2058 10/27/21 2330 10/28/21 0448  BP: 133/62 114/70 137/79 123/71  Pulse: 91 89 98 87  Resp: 19 20 (!) 24 18  Temp: 99.3 F (37.4 C) 98.7 F (37.1 C) 98.2 F (36.8 C) 97.8 F (36.6 C)  TempSrc: Oral Oral Axillary Oral  SpO2: 95% 92% 92% 94%  Weight:      Height:       Supplemental O2: Room Air SpO2: 94 % O2 Flow Rate (L/min): 2 L/min  Filed Weights   10/23/21 1200 10/24/21 0316  Weight: 81.6 kg 77.1 kg     Intake/Output Summary (Last 24 hours) at 10/28/2021 0759 Last data filed at 10/27/2021 2300 Gross per 24 hour  Intake --  Output 200 ml  Net -200 ml   Net IO Since Admission: -481.94 mL [10/28/21 0759]  Physical Exam: Constitutional:Somnolent gentleman in no acute distress. Cardio:Regular rate and rhythm. No murmurs, rubs, gallops. Pulm:Normal work of breathing on room air. OEV:[id number] for extremity edema. Skin:Skin is warm and dry. Blister on R 5th finger is now deflated and red. Healing rash from poison oak present over R forearm. Neuro: Mental Status: Patient is oriented to self, place, and time. Has LUE neglect. 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 V: Facial sensation decreased on L. VII: Facial movement is symmetric.  VIII: Hearing is  intact to voice Motor: RUE and RLE 4/5; LUE 0/5, and LLE 2/5.  Sensory: Sensation is intact on RUE and RLE. Endorses sensation to LLE and L side of face however denies sensation to LUE.   Patient Lines/Drains/Airways Status     Active Line/Drains/Airways     Name Placement date Placement time Site Days   Peripheral IV 10/23/21 20 G Anterior;Distal;Left;Upper Arm 10/23/21  --  Arm  5   External Urinary Catheter 10/26/21  --  --  2             ASSESSMENT/PLAN:  Assessment: Principal Problem:   CVA (cerebral vascular accident) [facility]) Active Problems:   Essential hypertension   Aspiration pneumonia (HCC)   Erythrocytosis   Hyponatremia   Acute kidney injury (H[facility])   Plan: #MRSA and streptococcus pyogenes bacteremia Blood cultures were collected overnight 06/12 as patient's presentation was concerning for sepsis. He had an increasing temperature (Tmax 100.1), hypotension, tachycardia, and new supplemental O2 requirements. He was initiated on metronidazole, vancomycin, and ceftriaxone for broad spectrum coverage until a source could be identified as well as IVF. Initial and repeat CXR without acute abnormalities. Blood culture resulted positive for MRSA and strep pyogenes 06/13 and antibiotics were changed to vancomycin and ceftriaxone. He has been afebrile and hemodynamically stable without increased O2 requirement since daytime 06/13. Leukocytosis drastically improved to 11.1 (27.4). Source of infection still not clear, though at this time my leading concern is from skin breakdown from scratching  at known poison oak rash allowing for inoculation; there is a large blister on the pink of his R hand which is the affected limb. Blister is no longer enlarged with purulent appearing fluid, rather is now flat and red.  -ID on board, appreciate their assistance  -MRI R hand  -Orthopantogram  -TEE -Continue vancomycin, ceftriaxone -Trend CBC, fever curve  #R PCA Infarct Exam today  revealed no acute changes. He has been working with our therapy teams who continue to recommend CIR at time of discharge.  -Neurology following, appreciate their assistance             -Now enrolled in clinical trial -SCDs only for VTE prophylaxis -Not a TNK candidate or thrombectomy candidate per most recent note -Will need to have loop recorder placed prior to discharge to assess for a fib, however this will be delayed until completion of antibiotics for bacteremia -Continue atorvastatin 80 mg daily -Continue ASA 81 mg daily x 3 weeks -Continue Plavix 75 mg daily -Lifestyle modifications for pre-diabetes and hypertriglyceridemia    #Erythrocytosis Hgb improved to 16.8 today. There is still concern that his hyperviscosity could increase risk of future thrombotic events, however there were no formal recommendations yesterday regarding therapeutic phlebotomy. Smear review was unremarkable, JAK2 testing pending at this time. -F/u JAK2 -Trend CBC   #Acute kidney injury, resolved Likely secondary to hypotensive episode overnight 06/12, during which he received IVF resuscitation. Scr now back to baseline at 1.04 from 1.50.  -Avoid nephrotoxic agents -Trend BMP   #Hypertension Normotensive at this time, losartan still being held. -Restart losartan 50 mg daily if indicated.  Best Practice: Diet: Dysphagia 3 IVF: None VTE: Place and maintain sequential compression device Start: 10/26/21 1121 Code: Full AB: Vancomycin, ceftriaxone Therapy Recs: CIR Family Contact: [name] and [name] (partner), called and notified. DISPO: Anticipated discharge pending Medical stability and approval for CIR/insurance auth .  Signature: Champ Mungo, D.O.  Internal Medicine Resident, PGY-1 [facility]  Pager: [phone] 7:59 AM, 10/28/2021   Please contact the on call pager after 5 pm and on weekends at [phone].

## 2021-10-28 NOTE — Progress Notes (Addendum)
Stroke Team Progress Note   ATTENDING NOTE: I reviewed above note and agree with the assessment and plan. Pt was seen and examined.   71 year old male with history of hypertension, glaucoma, gout, TIA in 2018 admitted for left-sided numbness, left hand weakness and left leg gave out.  Patient had a TIA in 2018, MRI negative for acute infarct, carotid Doppler negative.  MRA showed left P2 stenosis.  On this admission CT no acute abnormality.  CTA head neck showed left ICA 75% stenosis, CT perfusion negative.  MRI showed right PCA including thalamus small scattered infarcts.  EF 60 to 65%, LDL 52, A1c 5.8.  Patient was put on aspirin and Plavix DAPT and Lipitor 80.  Patient also enrolled to [facility] clinical trial.  However, patient had code stroke activation in the evening of 10/26/2021 due to altered mental status, worsening left-sided weakness, right-sided gaze, elevated BP and HR.  Found to have spike WBC to 23.2, AKI with creatinine 1.5, Tmax 100.1.  CT repeat showed increased size of subacute right PCA infarct.  CT head and neck repeat showed right P2 occlusion, left P2 moderate stenosis, left ICA significance and soft plaque but no significant stenosis.  MRI repeat showed large complete right PCA infarct including thalamus.  Blood culture positive for MRSA, ID consulted, currently on vancomycin and Rocephin.  On exam, patient wife at bedside, patient lying in bed, awake alert, orientated x3, answer questions appropriately, no aphasia, follows simple commands, mild dysarthria.  However left hemianopia, right gaze preference, left gaze incomplete, slight left facial droop, left upper extremity flaccid, left lower extremity 1-2/5.  Left-sided sensory loss.  Etiology for patient's stroke not quite clear, concerning for large vessel disease versus cardioembolic source versus endocarditis.  Patient extension of right PCA stroke likely due to hypotension in the setting of bacteremia, sepsis, septic shock.   Recommend TEE to rule out endocarditis.  If TEE ruled out, may still consider cardio monitoring such as a 30-day monitoring or loop recorder.  Okay to continue aspirin Plavix DAPT as well as Lipitor 80 and investigational drug at this time.  Will reconsider antithrombotic regimen if TEE shows valvular vegetation.  Continue antibiotic per ID.  We will follow  For detailed assessment and plan, please refer to above as I have made changes wherever appropriate.   Rosalin Hawking, MD PhD Stroke Neurology 10/28/2021 9:51 PM  I spent extensive time with the patient, patient wife at bedside, and patient brother over the phone, more than 50% of which was spent in counseling and coordination of care, reviewing test results, images and medication, and discussing the diagnosis, treatment plan and potential prognosis. This patient's care requiresreview of multiple databases, neurological assessment, discussion with family, other specialists and medical decision making of high complexity. I had long discussion with patient and wife at bedside as well as brother over the phone, updated pt current condition, treatment plan and potential prognosis, and answered all the questions.  They expressed understanding and appreciation.     SUBJECTIVE code stroke was called.the night before . His wife is at the bedside and states he has been agitated and @ times confused through out the night. There is  still concern for possible sepsis.  He is still febrile, however WBC has declined to 11K. Cultures pending  OBJECTIVE Most recent Vital Signs: Temp: 98.1 F (36.7 C) (06/14 1207) Temp Source: Oral (06/14 1207) BP: 129/79 (06/14 1207) Pulse Rate: 88 (06/14 1207) Respiratory Rate: 18 O2 Saturdation: 96%  CBG (last  3)  Recent Labs    10/26/21 1922  GLUCAP 139*   MRI Brain w/o contrast: 10/28/21    IMPRESSION: 1. Motion degraded exam. 2. Interval worsening and expansion of previously identified right PCA  distribution infarct, now fairly large in and involving the majority of the right PCA distribution. Minimal associated petechial blood products without frank hemorrhagic transformation. 3. No other new acute intracranial abnormality.  Studies:  CT head 10/24/2021 expected evolutionary changes in the right PCA infarct no hemorrhagic conversion or acute change. MRI brain without contrast multifocal ischemic changes in the right PCA territory involving dorsal thalamus, medial right temporal lobe and right occipital lobe.  No hemorrhage or mass effect.   CTA evaluation is limited by poor bolus timing of venous contamination.  Approximate 75% stenosis in proximal left ICA.  Evaluation of distal intracranial vasculature is severely limited. CT perfusion no core infarct but decreased perfusion in bilateral cerebellar and cerebral hemispheres likely artifactual. MRI Brain Completed and shows a motion degraded exam, There is interval worsening and expansion of previously identified on the right PCA distribution which is now fairly large and involving  the majority of right PCA distribution. No other new acute intracranial abnormality ECHO 60-65%. LDL 52 mg percent HbA1c 5.8 Physical Exam:    [name] laying in bed with his wife sitting next to him  Neurological Exam ; Awake alert oriented to person and place [facility])  Speech is dysarthric. And closes his eyes when speaking. States "Im tired". Extraocular movements are full range without nystagmus.   left homonymous hemianopsia.  Symmetric facial movements without weakness.  Facial sensation intact..   Motor: no drift in right UE/LE.  Left leg: 2/5. Left arm 0/5 distally, 2/5 proximally. Sensory: Diminished left hemibody touch light touch. Reflexes are symmetric.  Plantars are downgoing.    NIH stroke scale 3. 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  outside time window for thrombolysis and CT angiogram did not show any LVO. Vascular risk factors of hypertension, hyperlipidemia, extracranial ICA stenosis.  Recent code stroke called likely due to interval worsening and expansion of right PCA region infarct in setting of fever and possible sepsis. Primary doing infectious workup.     Hospital day # 4  Recommendation: -Con't  [place] stroke prevention trial  (standard antiplatelet therapy plus factor XI inhibitor Asundexian  versus standard medical therapy plus placebo. Patients MRI is now with interval worsening and expansion of Right PCA Stroke. Will discuss with Dr Erlinda Hong -.Consider TEE to r/o endocarditis/ cryptogenic infection  -Loop recorder on discharge.   [place] , PA-C  10/28/2021 4:47 PM

## 2021-10-28 NOTE — Consult Note (Signed)
Roberto [name] for Infectious Disease    Date of Admission:  10/23/2021   Total days of inpatient antibiotics 2        Reason for Consult: Polymicrobial     Principal Problem:   CVA (cerebral vascular accident) [facility]) Active Problems:   Essential hypertension   Aspiration pneumonia ([place])   Erythrocytosis   Hyponatremia   Acute kidney injury ([place])   Assessment: 71 YM who is a [place] admitted for CVA, recent hx of contact dermatitis due to poison IV.  #Hospital associated MRSA  and Strep pyogenes bacteremia #Right 5th finger tenderness #Poor dentition #Hx of recent contact dermatitis(R fore-arm) due to poison IV #CVA -No signs of overt phlebitis(mild erythema in antecubital) noted but he did develop a significant leukocytosis on day 4 of hospitalization. R PIV(6/9-6/13). More likely he was bacteremic coming in. He had poison IV about a week ago (Monday), which he has been scratching, mostly on right forearm. He could have introduced bacteremia through those wounds. Noted to have tender right 5 th finger and poor dentition.  -TTE showed no vegetation Recommendations:  -D/C ceftriaxone -Continue vancomycin -Add Clindamycin x 48h -Tee -Repeat cx -Orthopantogram -Right hand MRI Microbiology:   Antibiotics: Cefepime 6/11-6/12 Metronidazole 6/12 Unasyn 6/13 Ceftriaxone 6/13-p Vancomycin 6/13-p  Cultures: Blood 6/13 MRSA and strep pyogenes    HPI: Roberto [name] is a 71 y.o. male with pertinent PMH of CVA admitted for CVA. He had left sided facial and extremity numbness x 2days. CTA showed left ICA 75% stenosis and decreased perfusion of b/l cerebral and cerebellar hemisphere. MRI brain showed multifocal acute ischemia with right PCA territory, involving dorsal thalamus, medial right temporal lobe and right occipital lobe. MRI today showed interval worsening  of R PCA infarct. ID engaged as pt wbc trended to 27K, blood Cx+ MRSA and Strep pyogenes.     Review of Systems: ROS  Past Medical History:  Diagnosis Date   Anemia    Glaucoma    Hypertension     Social History   Tobacco Use   Smoking status: Never   Smokeless tobacco: Never  Substance Use Topics   Alcohol use: Yes    Alcohol/week: 1.0 standard drink of alcohol    Types: 1 Cans of beer per week    Comment: daily   Drug use: No    Family History  Problem Relation Age of Onset   Heart disease Mother    Stroke Father    Scheduled Meds:  aspirin  81 mg Oral Daily   atorvastatin  80 mg Oral Daily   clopidogrel  75 mg Oral Daily   melatonin  3 mg Oral QHS   OCEANIC-STROKE asundexian or placebo  1 tablet Oral Daily   vancomycin variable dose per unstable renal function (pharmacist dosing)   Does not apply See admin instructions   Continuous Infusions:  cefTRIAXone (ROCEPHIN)  IV 2 g (10/27/21 2111)   PRN Meds:.acetaminophen, acetaminophen-codeine, calamine No Known Allergies  OBJECTIVE: Blood pressure 121/62, pulse 87, temperature 97.8 F (36.6 C), temperature source Oral, resp. rate 18, height 5\' 6"  (1.676 m), weight 77.1 kg, SpO2 94 %.  Physical Exam Constitutional:      General: He is not in acute distress.    Appearance: He is normal weight. He is not toxic-appearing.  HENT:     Head: Normocephalic and atraumatic.     Right Ear: External ear normal.     Left Ear: External ear  normal.     Nose: No congestion or rhinorrhea.     Mouth/Throat:     Mouth: Mucous membranes are moist.     Pharynx: Oropharynx is clear.     Comments: Multiple dental carries Eyes:     Extraocular Movements: Extraocular movements intact.     Conjunctiva/sclera: Conjunctivae normal.     Pupils: Pupils are equal, round, and reactive to light.  Cardiovascular:     Rate and Rhythm: Normal rate and regular rhythm.     Heart sounds: No murmur heard.    No friction rub. No gallop.  Pulmonary:     Effort: Pulmonary effort is normal.     Breath sounds: Normal breath  sounds.  Abdominal:     General: Abdomen is flat. Bowel sounds are normal.     Palpations: Abdomen is soft.  Musculoskeletal:        General: No swelling.     Cervical back: Normal range of motion and neck supple.     Comments: Right pinky finger with limited movement.   Skin:    General: Skin is warm and dry.     Comments: Right forearm rash  Neurological:     General: No focal deficit present.     Mental Status: He is oriented to person, place, and time.  Psychiatric:        Mood and Affect: Mood normal.      Lab Results Lab Results  Component Value Date   WBC 11.1 (H) 10/28/2021   HGB 16.8 10/28/2021   HCT 48.4 10/28/2021   MCV 92.7 10/28/2021   PLT 162 10/28/2021    Lab Results  Component Value Date   CREATININE 1.04 10/28/2021   BUN 28 (H) 10/28/2021   NA 129 (L) 10/28/2021   K 3.6 10/28/2021   CL 104 10/28/2021   CO2 17 (L) 10/28/2021    Lab Results  Component Value Date   ALT 24 10/27/2021   AST 23 10/27/2021   ALKPHOS 63 10/27/2021   BILITOT 1.4 (H) 10/27/2021       Laurice Record, MD [facility] 10/28/2021, 10:28 AM

## 2021-10-28 NOTE — Progress Notes (Signed)
Speech Language Pathology Treatment: Dysphagia  Patient Details Name: Roberto [name] MRN: [id number] DOB: 10/11/1950 Today's Date: 10/28/2021 Time: 1345-1400 SLP Time Calculation (min) (ACUTE ONLY): 15 min  Assessment / Plan / Recommendation Clinical Impression  Patient seen by SLP for skilled treatment focused on dysphagia goals with patient's significant other present in room. She reported that patient has eaten very little food and that he doesn't seem to want anything, even items he would normally enjoy. Patient had eyes closed but would open them when requested and would respond with one-word answers to basic questions and he would follow basic one-step commands. He was able to hold cup of water in hand and gave self a sip when verbally cued to initiate but without cues, he would not initiate or interact at all. SLP did inform patient's girlfriend that if patient does not start eating enough food by mouth, may need to consider short term feeding tube; she was understanding an in agreement with potential need for this. SLP did not observe any overt s/s aspiration with limited sample of one cup sip of water. His girlfriend denied observing any coughing or other difficulty with PO intake. SLP will continue to follow acutely.    HPI HPI: Pt is a 71 y.o. M  who presented 6/9 with L sided facial and extremity numbness for two days. He states that he began feeling like something was 'off' a few days ago, and that yesterday he fell when walking back into his house from the mail box. When he fell he realized he couldn't walked and leaned on his truck to help get back inside. At that time he went to bed and when he woke this morning had L sided numbness and some arm pain; 10/23/21 MRI head revealed Multifocal acute ischemia within the right PCA territory, involving the dorsal thalamus, medial right temporal lobe and the right occipital lobe. No hemorrhage or mass effect. BSE 6/10 revealed oral deficits,  regular/thin liquid diet recommended. 6/12 Code Stroke called with Head CT/CTA showed worsening of R PCA. CXR negative. New BSE ordered due to conerns for worsening dysphagia. PMH: CVA, glaucoma, HTN.      SLP Plan  Continue with current plan of care      Recommendations for follow up therapy are one component of a multi-disciplinary discharge planning process, led by the attending physician.  Recommendations may be updated based on patient status, additional functional criteria and insurance authorization.    Recommendations  Diet recommendations: Dysphagia 3 (mechanical soft);Thin liquid Liquids provided via: Cup;No straw Medication Administration: Whole meds with puree Supervision: Staff to assist with self feeding;Full supervision/cueing for compensatory strategies Compensations: Minimize environmental distractions;Slow rate;Small sips/bites;Follow solids with liquid Postural Changes and/or Swallow Maneuvers: Seated upright 90 degrees;Upright 30-60 min after meal                Oral Care Recommendations: Oral care BID;Staff/trained caregiver to provide oral care Follow Up Recommendations: Acute inpatient rehab (3hours/day) Assistance recommended at discharge: Frequent or constant Supervision/Assistance SLP Visit Diagnosis: Dysphagia, unspecified (R13.10) Plan: Continue with current plan of care          Sonia Baller, MA, CCC-SLP Speech Therapy

## 2021-10-29 DIAGNOSIS — I63431 Cerebral infarction due to embolism of right posterior cerebral artery: Secondary | ICD-10-CM | POA: Diagnosis not present

## 2021-10-29 DIAGNOSIS — B954 Other streptococcus as the cause of diseases classified elsewhere: Secondary | ICD-10-CM | POA: Diagnosis not present

## 2021-10-29 DIAGNOSIS — R7881 Bacteremia: Secondary | ICD-10-CM | POA: Diagnosis not present

## 2021-10-29 DIAGNOSIS — B9562 Methicillin resistant Staphylococcus aureus infection as the cause of diseases classified elsewhere: Secondary | ICD-10-CM | POA: Diagnosis not present

## 2021-10-29 LAB — CBC
HCT: 46.3 % (ref 39.0–52.0)
Hemoglobin: 16.3 g/dL (ref 13.0–17.0)
MCH: 32.3 pg (ref 26.0–34.0)
MCHC: 35.2 g/dL (ref 30.0–36.0)
MCV: 91.7 fL (ref 80.0–100.0)
Platelets: 151 10*3/uL (ref 150–400)
RBC: 5.05 MIL/uL (ref 4.22–5.81)
RDW: 13 % (ref 11.5–15.5)
WBC: 8.8 10*3/uL (ref 4.0–10.5)
nRBC: 0 % (ref 0.0–0.2)

## 2021-10-29 LAB — BASIC METABOLIC PANEL
Anion gap: 9 (ref 5–15)
BUN: 24 mg/dL — ABNORMAL HIGH (ref 8–23)
CO2: 20 mmol/L — ABNORMAL LOW (ref 22–32)
Calcium: 8 mg/dL — ABNORMAL LOW (ref 8.9–10.3)
Chloride: 100 mmol/L (ref 98–111)
Creatinine, Ser: 0.85 mg/dL (ref 0.61–1.24)
GFR, Estimated: >60 mL/min (ref >60)
Glucose, Bld: 108 mg/dL — ABNORMAL HIGH (ref 70–99)
Potassium: 3.9 mmol/L (ref 3.5–5.1)
Sodium: 129 mmol/L — ABNORMAL LOW (ref 135–145)

## 2021-10-29 NOTE — Progress Notes (Signed)
Occupational Therapy Treatment Patient Details Name: Roberto [name] MRN: [id number] DOB: 10-Nov-1950 Today's Date: 10/29/2021   History of present illness 71 yo male admitted with L side facial and extremity numbness starting 6/7 MRI(+) multifocal acute ischemia R PCA territory involving dorsal thalmas, medial R temporal lobe, R occipial lobe. Code stroke activated 6/12, MRI revealing Increased size of evolving early subacute right PCA distribution infarct.  PMH CVA 2017 glaucoma, HTN   OT comments  Patient progressing slowly since new CVA, and showed improved trace movements to LT shoulder particularly shoulder extension, improved sitting balance and tolerance although does still report dizziness after prolonged EOB sitting, and ability to stand with Max As of 1 person, but very quick to fatigue with standing tolerance <5 seconds and inability to perform full 2nd stand although did try. Patient remains limited by generalized weakness and LT hemiparesis with flaccid LUE, decreased activity tolerance, and flat affect with depressed appearing mood,  along with deficits noted below. Pt continues to demonstrate good rehab potential and would benefit from continued skilled OT to increase safety and independence with ADLs and functional transfers to allow pt to return home safely and reduce caregiver burden and fall risk.    Recommendations for follow up therapy are one component of a multi-disciplinary discharge planning process, led by the attending physician.  Recommendations may be updated based on patient status, additional functional criteria and insurance authorization.    Follow Up Recommendations  Acute inpatient rehab (3hours/day)    Assistance Recommended at Discharge Frequent or constant Supervision/Assistance  Patient can return home with the following  Two people to help with walking and/or transfers;A lot of help with bathing/dressing/bathroom;Assist for transportation;Assistance with  feeding;Assistance with cooking/housework;Direct supervision/assist for medications management;Direct supervision/assist for financial management   Equipment Recommendations  Other (comment) (Defer to post-acute recommendations.)    Recommendations for Other Services Rehab consult    Precautions / Restrictions Precautions Precautions: Fall Precaution Comments: LT UE flaccid Restrictions Weight Bearing Restrictions: No       Mobility Bed Mobility Overal bed mobility: Needs Assistance Bed Mobility: Supine to Sit, Sit to Supine     Supine to sit: Max assist Sit to supine: Mod assist   General bed mobility comments: assistance for trunk and LLE support. verbal cues for technique and sequencing. increased time and effort required. Total Assist posterior scoot with pt crossing legs with need of max cues for pt to motor plan uncrossing LEs and bending knees to assist. 2nd scoot with MAx As of 2 with pt pushing with Feet and holding LT UE with RT after assist with positioning and instruction.    Transfers                         Balance Overall balance assessment: Needs assistance Sitting-balance support: Feet supported, Single extremity supported Sitting balance-Leahy Scale: Fair Sitting balance - Comments: intermittent use of RUE for support in sitting position and no external support required after ~1 min. Pt reaching RT hand to foot board initially to stablize. Balance was unpredictable at times.   Standing balance support: Bilateral upper extremity supported, During functional activity, Single extremity supported (RT UE on RW, LUE held by OT) Standing balance-Leahy Scale: Poor                             ADL either performed or assessed with clinical judgement   ADL Overall ADL's : Needs assistance/impaired  Toilet Transfer: Moderate assistance;Maximal assistance Toilet Transfer Details (indicate cue type and reason):  Worked on pre-ADL activities with LUE NMR, sitting balance/tolerance and sit to stand. Pt stood from EOB with Max As of 1 person, RT hand on RW and LT UE supported by OT. Pt stood <5 seconds and then required sitting rest.  Attempted 2nd stand from EOB to RW but pt unable to come fully upright on seconds stand and lowered back to EOB for safety. Pt unable to take steps. Toileting- Clothing Manipulation and Hygiene: Total assistance Toileting - Clothing Manipulation Details (indicate cue type and reason): On external catheter     Functional mobility during ADLs: Moderate assistance;Maximal assistance;Rolling walker (2 wheels);Cueing for sequencing General ADL Comments: pt with decreased awareness to the L side of body and needs cues to attend to L UE. S.O in room and states that she stands to his left often to reinforce LT sided awareness.    Extremity/Trunk Assessment Upper Extremity Assessment Upper Extremity Assessment: LUE deficits/detail LUE Deficits / Details: No active movement in L shoulder, trace to elbow, none to wrist or hand. PROM WFL. Pt able to perform ~2-/5 shoulder shrugs x 2 and shoulder extension of 2/5 x 10 while sitting EOB. LUE Sensation: decreased light touch;decreased proprioception (Absent light touch and deep pressure to LT hand) LUE Coordination: decreased fine motor;decreased gross motor       Cervical / Trunk Assessment Cervical / Trunk Assessment: Normal    Vision Baseline Vision/History: 3 Glaucoma Vision Assessment?: Vision impaired- to be further tested in functional context Additional Comments: L homonymous hemianopsia, R gaze with L inattention. Needs repeated cues to attend to LUE during exercises.   Perception Perception Perception: Impaired Inattention/Neglect: Does not attend to left visual field;Does not attend to left side of body   Praxis Praxis Praxis: Impaired Praxis Impairment Details: Ideation;Initiation;Ideomotor    Cognition  Arousal/Alertness: Awake/alert Behavior During Therapy: Flat affect, WFL for tasks assessed/performed Overall Cognitive Status: Impaired/Different from baseline Area of Impairment: Safety/judgement, Problem solving                         Safety/Judgement: Decreased awareness of safety, Decreased awareness of deficits Awareness: Intellectual Problem Solving: Slow processing, Requires verbal cues, Requires tactile cues General Comments: Mood appears depressed. Therapeutic use of self for encoruagement and empathizing with pt's situation. Pt did actively engage but c/o dizziness with sitting and standing. Vitals stable.        Exercises Other Exercises Other Exercises: emphais with initiating muscle activation in L LE, quality of movement. EOB pt performed shoulder shrugs x 10 with LT upper trap tapping to facilitate mm activation, scapular retractions with 5 sec hold each rep x8. Forearm supported shoulder extension/flexion x10.    Shoulder Instructions       General Comments vitals monitored throughout session without significant change. gown noted to be wet during session (under IV site in L arm). nursing alerted and present in room to assess.    Pertinent Vitals/ Pain       Pain Assessment Pain Assessment: No/denies pain  Home Living                                          Prior Functioning/Environment              Frequency  Min 2X/week  Progress Toward Goals  OT Goals(current goals can now be found in the care plan section)  Progress towards OT goals: Progressing toward goals  Acute Rehab OT Goals Patient Stated Goal: "Walk" OT Goal Formulation: With patient Time For Goal Achievement: 11/07/21 Potential to Achieve Goals: Good  Plan Discharge plan remains appropriate;Frequency remains appropriate    Co-evaluation                 AM-PAC OT "6 Clicks" Daily Activity     Outcome Measure   Help from another person  eating meals?: A Lot Help from another person taking care of personal grooming?: A Lot Help from another person toileting, which includes using toliet, bedpan, or urinal?: Total Help from another person bathing (including washing, rinsing, drying)?: A Lot Help from another person to put on and taking off regular upper body clothing?: A Lot Help from another person to put on and taking off regular lower body clothing?: Total 6 Click Score: 10    End of Session Equipment Utilized During Treatment: Gait belt;Rolling walker (2 wheels)  OT Visit Diagnosis: Unsteadiness on feet (R26.81);Muscle weakness (generalized) (M62.81);Low vision, both eyes (H54.2)   Activity Tolerance Patient tolerated treatment well   Patient Left in bed;with call bell/phone within reach;with family/visitor present;with nursing/sitter in room   Nurse Communication Other (comment) (RN to room to assist with bed linen change and return to bed.)        Time: 1310-1346 OT Time Calculation (min): 36 min  Charges: OT General Charges $OT Visit: 1 Visit OT Treatments $Therapeutic Activity: 8-22 mins $Neuromuscular Re-education: 8-22 mins  Victorino Dike, OT Acute Rehab Services Office: [phone] 10/29/2021  Theodoro Clock 10/29/2021, 2:23 PM

## 2021-10-29 NOTE — Progress Notes (Signed)
    [facility] has been requested to perform a transesophageal echocardiogram on 6/16 for Bacteremia.  After careful review of history and examination, the risks and benefits of transesophageal echocardiogram have been explained including risks of esophageal damage, perforation (1:10,000 risk), bleeding, pharyngeal hematoma as well as other potential complications associated with conscious sedation including aspiration, arrhythmia, respiratory failure and death. Alternatives to treatment were discussed, questions were answered. Patient is willing to proceed. Labs and vital signs stable.   Laverda Page, NP-C 10/29/2021 3:11 PM

## 2021-10-29 NOTE — Progress Notes (Addendum)
Speech Language Pathology Treatment: Dysphagia  Patient Details Name: Roberto [name] MRN: [id number] DOB: [birth year] Today's Date: 10/29/2021 Time: 0910-0950 SLP Time Calculation (min) (ACUTE ONLY): 40 min  Assessment / Plan / Recommendation Clinical Impression  SLP visit to assess pt tolerance of po diet and readiness for dietary advancement.  Pt with NO po intake for breakfast and states he is not hungry.  Admits that he is sad given his situation and suspect this may be contributing to his poor intake.  Pt informed this SLP that he will be "dead in six months", concerning for his mental status.  Pt without dysarthria and is fully alert today.  He declines coughing with intake and desires po medications to be taken with liquids - whole.  With great encouragement, pt completed 3 ounce Yale - probably missed approximately 1/2 ounce left in the cup.  Swallow was timely however and voice was clear.  Pt also consumed single cracker without oral pocketing nor deficits.  Pt observed taking medications with water without difficulties/oral pocketing.  Suspect resolution of acute dysphagia due to improved mentation/alertness.  Recommend to advance diet to regular/thin and encouraged his girlfriend to bring him something to eat that he may like given he is not hardly eating anything.  Using teach back with min cues for precautions, educated pt to recommendations.  Messaged MD with recommendations, concern for pt's sadness and diet advancement plans.  Thanks.   SLP recommend pt have a speech/cognitive evaluation at next venue of care given his obvious cognitive linguistic deficits.     HPI HPI: Pt is a 71 y.o. M  who presented 6/9 with L sided facial and extremity numbness for two days. He states that he began feeling like something was 'off' a few days ago, and that yesterday he fell when walking back into his house from the mail box. When he fell he realized he couldn't walked and leaned on his truck to help get  back inside. At that time he went to bed and when he woke this morning had L sided numbness and some arm pain; 10/23/21 MRI head revealed Multifocal acute ischemia within the right PCA territory, involving the dorsal thalamus, medial right temporal lobe and the right occipital lobe. No hemorrhage or mass effect. BSE 6/10 revealed oral deficits, regular/thin liquid diet recommended. 6/12 Code Stroke called with Head CT/CTA showed worsening of R PCA. CXR negative. New BSE ordered due to conerns for worsening dysphagia. PMH: CVA, glaucoma, HTN.      SLP Plan  Continue with current plan of care      Recommendations for follow up therapy are one component of a multi-disciplinary discharge planning process, led by the attending physician.  Recommendations may be updated based on patient status, additional functional criteria and insurance authorization.    Recommendations  Diet recommendations: Regular;Thin liquid Liquids provided via: Cup;Straw Medication Administration: Whole meds with liquid Supervision: Staff to assist with self feeding;Full supervision/cueing for compensatory strategies Compensations: Minimize environmental distractions;Slow rate;Small sips/bites (check for oral retention on the left due to sensory deficits) Postural Changes and/or Swallow Maneuvers: Seated upright 90 degrees;Upright 30-60 min after meal                Oral Care Recommendations: Oral care BID;Staff/trained caregiver to provide oral care Follow Up Recommendations: Acute inpatient rehab (3hours/day) Assistance recommended at discharge: Frequent or constant Supervision/Assistance SLP Visit Diagnosis: Dysphagia, unspecified (R13.10) Plan: Continue with current plan of care         Tammy  Kirtland Bouchard, MS [facility] SLP Acute Rehab Services Office [phone] Pager [phone]   Chales Abrahams  10/29/2021, 10:07 AM

## 2021-10-29 NOTE — Progress Notes (Signed)
HD#5 SUBJECTIVE:  Patient Summary: Roberto [name] is a 71 y.o. with a pertinent PMH of CVA in 2017, who presented with L sided numbness and admitted for R PCA infarct now complicated by MRSA and strep pyogenes bacteremia.  Overnight Events: None  Interim History: Patient reports not feeling great today as he is frustrated with his situation at present. He also has a headache over his R eye.  OBJECTIVE:  Vital Signs: Vitals:   10/28/21 2000 10/29/21 0000 10/29/21 0400 10/29/21 0851  BP: 124/80 136/83 124/79 117/88  Pulse:    78  Resp: 20   20  Temp: 97.9 F (36.6 C) 97.9 F (36.6 C) 98 F (36.7 C) 97.6 F (36.4 C)  TempSrc: Oral Oral Oral Oral  SpO2:    96%  Weight:      Height:       Supplemental O2: Room Air SpO2: 96 % O2 Flow Rate (L/min): 2 L/min  Filed Weights   10/23/21 1200 10/24/21 0316  Weight: 81.6 kg 77.1 kg     Intake/Output Summary (Last 24 hours) at 10/29/2021 0947 Last data filed at 10/28/2021 1800 Gross per 24 hour  Intake 290 ml  Output 700 ml  Net -410 ml    Net IO Since Admission: -771.94 mL [10/29/21 0947]  Physical Exam: Constitutional:Resting comfortably in bed in no acute distress. Cardio:Regular rate and rhythm. No murmurs, rubs, gallops. Pulm:Normal work of breathing on room air. WUJ:[id number] for extremity edema. Skin:Skin is warm and dry. Site of blister noted on lateral R 5th finger earlier in admission is now blotchy and red without tenderness endorsed.  Neuro: Mental Status: Patient is awake, alert, oriented x3 No signs of aphasia, prior total neglect of LUE has improved Cranial Nerves: III,IV, VI: Does not track to L.  V: Facial sensation is symmetric to light touch and temperature. VII: Facial movement is symmetric.  VIII: Hearing is intact to voice XI: Shoulder shrug normal on R, diminished on L. XII: Tongue is midline without atrophy or fasciculations.  Motor: RUE and RLE 5/5, LUE 1/5 with slight movement (previously no  movement on my exam), LLE 3/5. Sensory: Sensation is normal on RUE and RLE, diminished on LUE and LLE.   Patient Lines/Drains/Airways Status     Active Line/Drains/Airways     Name Placement date Placement time Site Days   Peripheral IV 10/23/21 20 G Anterior;Distal;Left;Upper Arm 10/23/21  --  Arm  5   External Urinary Catheter 10/26/21  --  --  2             ASSESSMENT/PLAN:  Assessment: Principal Problem:   CVA (cerebral vascular accident) (HCC) Active Problems:   Essential hypertension   Aspiration pneumonia (HCC)   Erythrocytosis   Hyponatremia   Acute kidney injury (HCC)   Plan: #MRSA and streptococcus pyogenes bacteremia Patient is afebrile and hemodynamically stable. Leukocytosis has resolved. His antibiotic regimen was narrowed to vancomycin and clindamycin 06/14. Source is still undetermined as further imaging studies are pending at this time.  -ID on board, appreciate their assistance  -MRI R hand  -Orthopantogram  -TEE--I will call and confirm that this is scheduled today -Continue vancomycin, clindamycin -Trend CBC, fever curve  #R PCA Infarct Pleasantly surprised to note movement of LUE and 1/5 strength today, compared to full neglect with no movement and 0/5 strength in the last several days. Disposition is unclear at this time as far as SNF vs CIR. He is working with therapy teams throughout admission. -Neurology following,  appreciate their assistance             -[place]ceanic Stroke Study -SCDs only for VTE prophylaxis -Not a TNK candidate or thrombectomy candidate per most recent note -Will need to have loop recorder placed once medically stable -Continue atorvastatin 80 mg daily -Continue ASA 81 mg daily x 3 weeks -Continue Plavix 75 mg daily -Lifestyle modifications for pre-diabetes and hypertriglyceridemia    #Erythrocytosis Hgb stable at 16.3 today.  -F/u JAK2 -Trend CBC   #Hypertension Normotensive at this time, losartan still being  held. -Restart losartan 50 mg daily if indicated.  #NAGMA Improving, bicarb 20 (17 06/14). Thought to be secondary to large volume diarrhea and/or sepsis with bacteremia in preceding days. -Trend BMP  Best Practice: Diet: Dysphagia 3 IVF: None VTE: Place and maintain sequential compression device Start: 10/26/21 1121 Code: Full AB: Vancomycin, ceftriaxone Therapy Recs: CIR Family Contact: [name] and [name] (partner), called and notified. DISPO: Anticipated discharge pending Medical stability and approval for CIR/insurance auth .  Signature: Champ Mungo, D.O.  Internal Medicine Resident, PGY-1 [facility]  Pager: [phone] 9:47 AM, 10/29/2021   Please contact the on call pager after 5 pm and on weekends at [phone].

## 2021-10-29 NOTE — Progress Notes (Addendum)
Stroke Team Progress Note   ATTENDING NOTE: I reviewed above note and agree with the assessment and Roberto [name]. Pt was seen and examined.   Wife at the bedside.  Patient lying in bed, more awake alert than yesterday, fully orientated.  However still has left hemianopia and left upper extremity plegia and left lower extremity paresis.  TEE scheduled for tomorrow to evaluate endocarditis.  If no endocarditis, may consider cardio monitoring for further stroke work-up.  Currently on vancomycin and clindamycin per ID.  Continue DAPT and statin drug, will reconsider antithrombotic regimen if TEE shows valvular vegetation.  BP stable.  PT/OT recommend CIR.  For detailed assessment and Roberto [name], please refer to below as I have made changes wherever appropriate.  Discussed with patient and wife, answered their questions.  Roberto Plan, MD PhD Stroke Neurology 10/29/2021 3:25 PM   SUBJECTIVE code stroke was called.the night before . His wife is at the bedside and states he has been agitated and @ times confused through out the night. There is  still concern for possible sepsis.  He is still febrile, however WBC has declined to 11K. Cultures pending  OBJECTIVE Most recent Vital Signs: Temp: 99.2 F (37.3 C) (06/15 1239) Temp Source: Oral (06/15 1239) BP: 122/79 (06/15 1239) Pulse Rate: 87 (06/15 1239) Respiratory Rate: 20 O2 Saturdation: 95%  CBG (last 3)  Recent Labs    10/26/21 1922  GLUCAP 139*   MRI Brain w/o contrast: 10/28/21    IMPRESSION: 1. Motion degraded exam. 2. Interval worsening and expansion of previously identified right PCA distribution infarct, now fairly large in and involving the majority of the right PCA distribution. Minimal associated petechial blood products without frank hemorrhagic transformation. 3. No other new acute intracranial abnormality.  Studies:  CT head 10/24/2021 expected evolutionary changes in the right PCA infarct no hemorrhagic conversion or acute  change. MRI brain without contrast multifocal ischemic changes in the right PCA territory involving dorsal thalamus, medial right temporal lobe and right occipital lobe.  No hemorrhage or mass effect.   CTA evaluation is limited by poor bolus timing of venous contamination.  Approximate 75% stenosis in proximal left ICA.  Evaluation of distal intracranial vasculature is severely limited. CT perfusion no core infarct but decreased perfusion in bilateral cerebellar and cerebral hemispheres likely artifactual. MRI Brain Completed and shows a motion degraded exam, There is interval worsening and expansion of previously identified on the right PCA distribution which is now fairly large and involving  the majority of right PCA distribution. No other new acute intracranial abnormality ECHO 60-65%. LDL 52 mg percent HbA1c 5.8 Physical Exam:    [name] laying in bed with his wife sitting next to him  Neurological Exam ; Awake alert oriented to person and place [facility])  Speech is dysarthric. And closes his eyes when speaking. States "Im tired". Extraocular movements are full range without nystagmus.   left homonymous hemianopsia.  Symmetric facial movements without weakness.  Facial sensation intact..   Motor: no drift in right UE/LE.  Left leg: 2/5. Left arm 0/5 distally, 2/5 proximally. Sensory: Diminished left hemibody touch light touch. Reflexes are symmetric.  Plantars are downgoing.    NIH stroke scale 3. 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  outside time window for thrombolysis and CT angiogram did not show any LVO. Vascular risk factors of hypertension, hyperlipidemia, extracranial ICA stenosis.  Recent code stroke called likely due to  interval worsening and expansion of right PCA region infarct in setting of fever and possible sepsis. Primary doing infectious workup.      Hospital day # 5  Recommendation: -Con't  Oceanic stroke prevention trial  (standard antiplatelet therapy plus factor XI inhibitor Asundexian  versus standard medical therapy plus placebo. Patients MRI is now with interval worsening and expansion of Right PCA Stroke. Will discuss with Dr Roda Shutters -.TEE to r/o endocarditis/ cryptogenic infection scheduled today -Loop recorder on discharge.  -Awaiting Discharge/ Rehab Planning per case management  Camillo Flaming , PA-C  10/29/2021 3:25 PM

## 2021-10-29 NOTE — Progress Notes (Signed)
Physical Therapy Treatment Patient Details Name: Roberto [name] MRN: [id number] DOB: 1950/07/10 Today's Date: 10/29/2021   History of Present Illness 71 yo male admitted with L side facial and extremity numbness starting 6/7 MRI(+) multifocal acute ischemia R PCA territory involving dorsal thalmas, medial R temporal lobe, R occipial lobe. Code stroke activated 6/12, MRI revealing Increased size of evolving early subacute right PCA distribution infarct.  PMH CVA 2017 glaucoma, HTN    PT Comments    Patient is agreeable to PT session. He does express frustration about being in the hospital and continued tests. Patient continues to require assistance with bed mobility. Limited sitting tolerance secondary to fatigue with minimal activity. However balance is fair with no external support required to maintain midline. Patient participated with LE exercises with emphasis on muscle activation. Trace movement noted in left UE today and education provided on positioning as patient reports impaired sensation of left arm. Recommend to continue PT to maximize independence and facilitate return to prior level of function.    Recommendations for follow up therapy are one component of a multi-disciplinary discharge planning process, led by the attending physician.  Recommendations may be updated based on patient status, additional functional criteria and insurance authorization.  Follow Up Recommendations  Acute inpatient rehab (3hours/day)     Assistance Recommended at Discharge Frequent or constant Supervision/Assistance  Patient can return home with the following Two people to help with walking and/or transfers;A little help with bathing/dressing/bathroom;Assistance with cooking/housework;Assist for transportation;Help with stairs or ramp for entrance   Equipment Recommendations       Recommendations for Other Services Rehab consult     Precautions / Restrictions Precautions Precautions:  Fall Restrictions Weight Bearing Restrictions: No     Mobility  Bed Mobility Overal bed mobility: Needs Assistance Bed Mobility: Supine to Sit, Sit to Supine Rolling: Mod assist   Supine to sit: Max assist Sit to supine: Mod assist   General bed mobility comments: assistance for trunk and LLE support. verbal cues for technique and sequencing. increased time and effort required    Transfers                   General transfer comment: patient declined standing due to fatigue and requesting to return to bed after minimal sitting activity.    Ambulation/Gait                   Stairs             Wheelchair Mobility    Modified Rankin (Stroke Patients Only)       Balance Overall balance assessment: Needs assistance Sitting-balance support: Feet supported, Single extremity supported Sitting balance-Leahy Scale: Fair Sitting balance - Comments: intermittent use of RUE for support in sitting position. no external support required                                    Cognition Arousal/Alertness: Awake/alert Behavior During Therapy: Flat affect, WFL for tasks assessed/performed Overall Cognitive Status: Impaired/Different from baseline Area of Impairment: Safety/judgement, Problem solving                         Safety/Judgement: Decreased awareness of safety, Decreased awareness of deficits   Problem Solving: Slow processing, Requires verbal cues, Requires tactile cues          Exercises General Exercises - Lower Extremity Ankle Circles/Pumps: AAROM,  Strengthening, Both, 10 reps, Supine Heel Slides: AAROM, Strengthening, 10 reps, Supine, Left Hip ABduction/ADduction: AAROM, Strengthening, 10 reps, Supine, Left Straight Leg Raises: AAROM, Strengthening, Left, 5 reps, Supine (performed x 2 bouts) Other Exercises Other Exercises: emphais with initiating muscle activation in L LE, quality of movement. all active movement of  LLE with with delay    General Comments General comments (skin integrity, edema, etc.): vitals monitored throughout session without significant change. gown noted to be wet during session (under IV site in L arm). nursing alerted and present in room to assess.      Pertinent Vitals/Pain Pain Assessment Pain Assessment: No/denies pain    Home Living                          Prior Function            PT Goals (current goals can now be found in the care plan section) Acute Rehab PT Goals Patient Stated Goal: to walk again PT Goal Formulation: With patient Time For Goal Achievement: 11/07/21 Potential to Achieve Goals: Good Progress towards PT goals: Progressing toward goals    Frequency    Min 4X/week      PT Plan Current plan remains appropriate    Co-evaluation              AM-PAC PT "6 Clicks" Mobility   Outcome Measure  Help needed turning from your back to your side while in a flat bed without using bedrails?: A Lot Help needed moving from lying on your back to sitting on the side of a flat bed without using bedrails?: Total Help needed moving to and from a bed to a chair (including a wheelchair)?: Total Help needed standing up from a chair using your arms (e.g., wheelchair or bedside chair)?: Total Help needed to walk in hospital room?: Total Help needed climbing 3-5 steps with a railing? : Total 6 Click Score: 7    End of Session   Activity Tolerance: Patient tolerated treatment well;Patient limited by fatigue Patient left: in bed;with call bell/phone within reach;with bed alarm set;with family/visitor present Nurse Communication: Mobility status PT Visit Diagnosis: Unsteadiness on feet (R26.81);Other abnormalities of gait and mobility (R26.89);Difficulty in walking, not elsewhere classified (R26.2);Other symptoms and signs involving the nervous system (R29.898)     Time: 4132-4401 PT Time Calculation (min) (ACUTE ONLY): 33 min  Charges:   $Therapeutic Exercise: 8-22 mins $Therapeutic Activity: 8-22 mins                     Donna Bernard, PT, MPT   [name] 10/29/2021, 12:50 PM

## 2021-10-29 NOTE — NC FL2 (Signed)
[place] MEDICAID FL2 LEVEL OF CARE SCREENING TOOL     IDENTIFICATION  Patient Name: Roberto [name] Birthdate: Nov 26, 1950 Sex: male Admission Date (Current Location): 10/23/2021  [facility] and [place] Number:  Producer, television/film/video and Address:  [facility], [address] [place], [place] [address]      Provider Number: [id number]  Attending Physician Name and Address:  Inez Catalina, MD  Relative Name and Phone Number:       Current Level of Care: Hospital Recommended Level of Care: Skilled Nursing Facility Prior Approval Number:    Date Approved/Denied:   PASRR Number: [id number]  Discharge Plan: SNF    Current Diagnoses: Patient Active Problem List   Diagnosis Date Noted   Aspiration pneumonia (HCC) 10/27/2021   Erythrocytosis 10/27/2021   Hyponatremia 10/27/2021   Acute kidney injury (HCC) 10/27/2021   CVA (cerebral vascular accident) (HCC) 10/24/2021   Gout 12/09/2016   Essential hypertension 12/09/2016   Numbness and tingling of left arm and leg    TIA (transient ischemic attack) 12/08/2016   Rosacea 12/31/2013    Orientation RESPIRATION BLADDER Height & Weight     Self, Situation, Place  Normal Incontinent Weight: 169 lb 15.6 oz (77.1 kg) Height:  5\' 6"  (167.6 cm)  BEHAVIORAL SYMPTOMS/MOOD NEUROLOGICAL BOWEL NUTRITION STATUS      Continent, Incontinent (incontinent at times) Diet (regular)  AMBULATORY STATUS COMMUNICATION OF NEEDS Skin   Extensive Assist Verbally Skin abrasions, Other (Comment) (rash, right arm and hand)                       Personal Care Assistance Level of Assistance  Bathing, Feeding, Dressing Bathing Assistance: Maximum assistance Feeding assistance: Limited assistance Dressing Assistance: Maximum assistance     Functional Limitations Info  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)        SPECIAL CARE FACTORS FREQUENCY  PT (By licensed PT), OT (By licensed OT)     PT Frequency: 5x/wk OT  Frequency: 5x/wk            Contractures Contractures Info: Not present    Additional Factors Info  Code Status, Allergies Code Status Info: Full Allergies Info: NKA           Current Medications (10/29/2021):  This is the current hospital active medication list Current Facility-Administered Medications  Medication Dose Route Frequency Provider Last Rate Last Admin   acetaminophen (TYLENOL) tablet 1,000 mg  1,000 mg Oral Q6H PRN 10/31/2021, MD   1,000 mg at 10/25/21 1613   aspirin chewable tablet 81 mg  81 mg Oral Daily 12/25/21, MD   81 mg at 10/29/21 0949   atorvastatin (LIPITOR) tablet 80 mg  80 mg Oral Daily 10/31/21, DO   80 mg at 10/29/21 0949   calamine lotion 1 application   1 application  Topical TID PRN 10/31/21, MD   1 application  at 10/26/21 1508   clindamycin (CLEOCIN) IVPB 600 mg  600 mg Intravenous Q8H 12/26/21, MD 100 mL/hr at 10/29/21 1458 600 mg at 10/29/21 1458   clopidogrel (PLAVIX) tablet 75 mg  75 mg Oral Daily 10/31/21, MD   75 mg at 10/29/21 0949   loperamide (IMODIUM) capsule 2 mg  2 mg Oral PRN 10/31/21, DO   2 mg at 10/28/21 2253   melatonin tablet 3 mg  3 mg Oral QHS Demaio, Alexa, MD   3 mg at 10/28/21  2221   Study - OCEANIC-STROKE - asundexian 50 mg or placebo tablet (PI-Sethi)  1 tablet Oral Daily Micki Riley, MD   50 mg at 10/29/21 1005   vancomycin (VANCOREADY) IVPB 1500 mg/300 mL  1,500 mg Intravenous Q24H Scarlett Presto, RPH 150 mL/hr at 10/29/21 1015 1,500 mg at 10/29/21 1015     Discharge Medications: Please see discharge summary for a list of discharge medications.  Relevant Imaging Results:  Relevant Lab Results:   Additional Information SS#: [id number]  Baldemar Lenis, LCSW

## 2021-10-29 NOTE — Progress Notes (Signed)
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   #Hospital associated MRSA  and Strep pyogenes bacteremia #Right 5th finger tenderness #Poor dentition #Hx of recent contact dermatitis(R fore-arm) due to poison IV #CVA -He developed a significant leukocytosis on day 4 of hospitalization. R PIV(6/9-6/13). Mild erythema/tenderness at the medial right antecubital site noted, pt reports that it is the same site where he had poison ivy and was scratching. Unclear if this represents phlebitis vs irritation from pruritis/scratching, will get [place].   -More likely he was bacteremic coming into hospitalization. He had poison IV about a week ago (Monday), which he has been scratching, mostly on right forearm. He could have introduced bacteremia through those wounds. Noted to have tender right 5 th finger and poor dentition.  -TTE showed no vegetation Recommendations:  -Continue vancomycin -Continue  Clindamycin x 48h -Tee tomorrow -Follow repeat blood Cx to ensure clearance -RUE [place] -Orthopantogram -Right hand MRI  Microbiology:   Antibiotics: Cefepime 6/11-6/12 Metronidazole 6/12 Unasyn 6/13 Ceftriaxone 6/13-p Vancomycin 6/13-p   Cultures: Blood 6/13 MRSA and strep pyogenes 6/14 NG SUBJECTIVE: Resting in bed. No new complaints. Interval: wbc 8.8, afebrile overnight.   Review of Systems: ROS   Scheduled Meds:  aspirin  81 mg Oral Daily   atorvastatin  80 mg Oral Daily   clopidogrel  75 mg Oral Daily   melatonin  3 mg Oral QHS   OCEANIC-STROKE asundexian or placebo  1 tablet Oral Daily    Continuous Infusions:  clindamycin (CLEOCIN) IV 600 mg (10/29/21 1458)   vancomycin 1,500 mg (10/29/21 1015)   PRN Meds:.acetaminophen, calamine, loperamide No Known Allergies  OBJECTIVE: Vitals:   10/29/21 0000 10/29/21 0400 10/29/21 0851 10/29/21 1239  BP: 136/83 124/79 117/88 122/79  Pulse:   78 87  Resp:   20   Temp: 97.9 F (36.6 C) 98 F (36.7 C) 97.6 F (36.4 C) 99.2 F (37.3 C)  TempSrc: Oral Oral Oral Oral  SpO2:   96% 95%  Weight:      Height:       Body mass index is 27.43 kg/m.  Physical Exam Constitutional:      General: He is not in acute distress.    Appearance: He is normal weight. He is not toxic-appearing.  HENT:     Head: Normocephalic and atraumatic.     Right Ear: External ear normal.     Left Ear: External ear normal.     Nose: No congestion or rhinorrhea.     Mouth/Throat:     Mouth: Mucous membranes are moist.     Pharynx: Oropharynx is clear.  Eyes:     Extraocular Movements: Extraocular movements intact.     Conjunctiva/sclera: Conjunctivae normal.     Pupils: Pupils are equal, round, and reactive to light.  Cardiovascular:     Rate and Rhythm: Normal rate and regular rhythm.     Heart sounds: No murmur heard.    No friction rub. No gallop.  Pulmonary:     Effort: Pulmonary effort is normal.  Breath sounds: Normal breath sounds.  Abdominal:     General: Abdomen is flat. Bowel sounds are normal.     Palpations: Abdomen is soft.  Musculoskeletal:        General: No swelling. Normal range of motion.     Cervical back: Normal range of motion and neck supple.  Skin:    General: Skin is warm and dry.     Comments: Right forearms with erythema and right 5 finger tender  Neurological:     General: No focal deficit present.     Mental Status: He is oriented to person, place, and time.  Psychiatric:        Mood and Affect: Mood normal.       Lab Results Lab Results  Component Value Date   WBC 8.8 10/29/2021   HGB 16.3  10/29/2021   HCT 46.3 10/29/2021   MCV 91.7 10/29/2021   PLT 151 10/29/2021    Lab Results  Component Value Date   CREATININE 0.85 10/29/2021   BUN 24 (H) 10/29/2021   NA 129 (L) 10/29/2021   K 3.9 10/29/2021   CL 100 10/29/2021   CO2 20 (L) 10/29/2021    Lab Results  Component Value Date   ALT 24 10/27/2021   AST 23 10/27/2021   ALKPHOS 63 10/27/2021   BILITOT 1.4 (H) 10/27/2021        Danelle Earthly, MD [facility] 10/29/2021, 3:08 PM

## 2021-10-30 ENCOUNTER — Inpatient Hospital Stay (HOSPITAL_COMMUNITY): Payer: PRIVATE HEALTH INSURANCE | Admitting: Anesthesiology

## 2021-10-30 ENCOUNTER — Inpatient Hospital Stay (HOSPITAL_COMMUNITY): Payer: PRIVATE HEALTH INSURANCE

## 2021-10-30 ENCOUNTER — Encounter (HOSPITAL_COMMUNITY): Payer: Self-pay | Admitting: Internal Medicine

## 2021-10-30 ENCOUNTER — Encounter (HOSPITAL_COMMUNITY)
Admission: EM | Disposition: A | Discharge status: Skilled Nursing Facility | Payer: Self-pay | Source: Home / Self Care | Attending: Internal Medicine

## 2021-10-30 DIAGNOSIS — Q2112 Patent foramen ovale: Secondary | ICD-10-CM | POA: Diagnosis not present

## 2021-10-30 DIAGNOSIS — I63531 Cerebral infarction due to unspecified occlusion or stenosis of right posterior cerebral artery: Secondary | ICD-10-CM | POA: Diagnosis not present

## 2021-10-30 DIAGNOSIS — I340 Nonrheumatic mitral (valve) insufficiency: Secondary | ICD-10-CM

## 2021-10-30 DIAGNOSIS — I69354 Hemiplegia and hemiparesis following cerebral infarction affecting left non-dominant side: Secondary | ICD-10-CM | POA: Diagnosis not present

## 2021-10-30 DIAGNOSIS — M7989 Other specified soft tissue disorders: Secondary | ICD-10-CM

## 2021-10-30 DIAGNOSIS — I10 Essential (primary) hypertension: Secondary | ICD-10-CM

## 2021-10-30 DIAGNOSIS — R7881 Bacteremia: Secondary | ICD-10-CM

## 2021-10-30 DIAGNOSIS — I63431 Cerebral infarction due to embolism of right posterior cerebral artery: Secondary | ICD-10-CM | POA: Diagnosis not present

## 2021-10-30 DIAGNOSIS — B9562 Methicillin resistant Staphylococcus aureus infection as the cause of diseases classified elsewhere: Secondary | ICD-10-CM | POA: Diagnosis not present

## 2021-10-30 DIAGNOSIS — N179 Acute kidney failure, unspecified: Secondary | ICD-10-CM | POA: Diagnosis not present

## 2021-10-30 DIAGNOSIS — B954 Other streptococcus as the cause of diseases classified elsewhere: Secondary | ICD-10-CM | POA: Diagnosis not present

## 2021-10-30 HISTORY — PX: TEE WITHOUT CARDIOVERSION: SHX5443

## 2021-10-30 HISTORY — PX: BUBBLE STUDY: SHX6837

## 2021-10-30 LAB — BASIC METABOLIC PANEL
Anion gap: 9 (ref 5–15)
BUN: 18 mg/dL (ref 8–23)
CO2: 21 mmol/L — ABNORMAL LOW (ref 22–32)
Calcium: 8.2 mg/dL — ABNORMAL LOW (ref 8.9–10.3)
Chloride: 103 mmol/L (ref 98–111)
Creatinine, Ser: 0.85 mg/dL (ref 0.61–1.24)
GFR, Estimated: >60 mL/min (ref >60)
Glucose, Bld: 126 mg/dL — ABNORMAL HIGH (ref 70–99)
Potassium: 3.5 mmol/L (ref 3.5–5.1)
Sodium: 133 mmol/L — ABNORMAL LOW (ref 135–145)

## 2021-10-30 LAB — CULTURE, BLOOD (ROUTINE X 2)
Special Requests: ADEQUATE
Special Requests: ADEQUATE

## 2021-10-30 LAB — CBC
HCT: 46.8 % (ref 39.0–52.0)
Hemoglobin: 16.4 g/dL (ref 13.0–17.0)
MCH: 32 pg (ref 26.0–34.0)
MCHC: 35 g/dL (ref 30.0–36.0)
MCV: 91.2 fL (ref 80.0–100.0)
Platelets: 164 10*3/uL (ref 150–400)
RBC: 5.13 MIL/uL (ref 4.22–5.81)
RDW: 12.6 % (ref 11.5–15.5)
WBC: 7.9 10*3/uL (ref 4.0–10.5)
nRBC: 0 % (ref 0.0–0.2)

## 2021-10-30 SURGERY — ECHOCARDIOGRAM, TRANSESOPHAGEAL
Anesthesia: Monitor Anesthesia Care

## 2021-10-30 MED ORDER — SODIUM CHLORIDE 0.9 % IV SOLN: INTRAVENOUS | Status: DC | PRN

## 2021-10-30 MED ORDER — PROPOFOL 10 MG/ML IV BOLUS
INTRAVENOUS | Status: DC | PRN
  Administered 2021-10-30: 40 mg via INTRAVENOUS
  Administered 2021-10-30: 30 mg via INTRAVENOUS
  Administered 2021-10-30: 20 mg via INTRAVENOUS

## 2021-10-30 MED ORDER — PROPOFOL 500 MG/50ML IV EMUL
INTRAVENOUS | Status: DC | PRN
  Administered 2021-10-30: 100 ug/kg/min via INTRAVENOUS

## 2021-10-30 MED ORDER — ENOXAPARIN SODIUM 40 MG/0.4ML IJ SOSY: 40.0000 mg | PREFILLED_SYRINGE | INTRAMUSCULAR | Status: DC

## 2021-10-30 NOTE — Anesthesia Procedure Notes (Signed)
Procedure Name: MAC Date/Time: 10/30/2021 1:37 PM  Performed by: Erick Colace, CRNAPre-anesthesia Checklist: Suction available, Patient being monitored, Emergency Drugs available and Patient identified Patient Re-evaluated:Patient Re-evaluated prior to induction Oxygen Delivery Method: Nasal cannula Dental Injury: Teeth and Oropharynx as per pre-operative assessment

## 2021-10-30 NOTE — Progress Notes (Signed)
Pt went down for TEE.

## 2021-10-30 NOTE — Anesthesia Preprocedure Evaluation (Addendum)
Anesthesia Evaluation  Patient identified by MRN, date of birth, ID band Patient awake    Reviewed: Allergy & Precautions, NPO status , Patient's Chart, lab work & pertinent test results  Airway Mallampati: II  TM Distance: >3 FB Neck ROM: Full    Dental  (+) Dental Advisory Given, Poor Dentition, Chipped, Missing,    Pulmonary neg pulmonary ROS,    Pulmonary exam normal breath sounds clear to auscultation       Cardiovascular hypertension, Pt. on medications Normal cardiovascular exam Rhythm:Regular Rate:Normal  TTE 2023 1. Left ventricular ejection fraction, by estimation, is 60 to 65%. The  left ventricle has normal function. The left ventricle has no regional  wall motion abnormalities. Left ventricular diastolic parameters are  consistent with Grade I diastolic  dysfunction (impaired relaxation).  2. Right ventricular systolic function is normal. The right ventricular  size is normal.  3. The mitral valve is normal in structure. No evidence of mitral valve  regurgitation. No evidence of mitral stenosis.  4. The aortic valve is tricuspid. Aortic valve regurgitation is not  visualized. Aortic valve sclerosis is present, with no evidence of aortic  valve stenosis.  5. The inferior vena cava is normal in size with greater than 50%  respiratory variability, suggesting right atrial pressure of 3 mmHg.    Neuro/Psych CVA (left sided weakness), Residual Symptoms negative psych ROS   GI/Hepatic negative GI ROS, Neg liver ROS,   Endo/Other  negative endocrine ROS  Renal/GU negative Renal ROS  negative genitourinary   Musculoskeletal negative musculoskeletal ROS (+)   Abdominal   Peds  Hematology negative hematology ROS (+)   Anesthesia Other Findings 76 YM admitted for CVA now with Hospital associated MRSA and Strep pyogenes bacteremia  Reproductive/Obstetrics                             Anesthesia Physical Anesthesia Plan  ASA: 3  Anesthesia Plan: MAC   Post-op Pain Management:    Induction: Intravenous  PONV Risk Score and Plan: Propofol infusion and Treatment may vary due to age or medical condition  Airway Management Planned: Natural Airway  Additional Equipment:   Intra-op Plan:   Post-operative Plan:   Informed Consent: I have reviewed the patients History and Physical, chart, labs and discussed the procedure including the risks, benefits and alternatives for the proposed anesthesia with the patient or authorized representative who has indicated his/her understanding and acceptance.     Dental advisory given  Plan Discussed with: CRNA  Anesthesia Plan Comments:         Anesthesia Quick Evaluation

## 2021-10-30 NOTE — Plan of Care (Signed)

## 2021-10-30 NOTE — Anesthesia Postprocedure Evaluation (Signed)
Anesthesia Post Note  Patient: Roberto [name]  Procedure(s) Performed: TRANSESOPHAGEAL ECHOCARDIOGRAM (TEE) BUBBLE STUDY     Patient location during evaluation: Endoscopy Anesthesia Type: MAC Level of consciousness: awake and alert Pain management: pain level controlled Vital Signs Assessment: post-procedure vital signs reviewed and stable Respiratory status: spontaneous breathing, nonlabored ventilation, respiratory function stable and patient connected to nasal cannula oxygen Cardiovascular status: blood pressure returned to baseline and stable Postop Assessment: no apparent nausea or vomiting Anesthetic complications: no   No notable events documented.  Last Vitals:  Vitals:   10/30/21 1414 10/30/21 1421  BP: 125/70 126/79  Pulse: 69 75  Resp: 15 16  Temp:    SpO2: 95% 98%    Last Pain:  Vitals:   10/30/21 1421  TempSrc:   PainSc: 0-No pain                 Emilya Justen L Cordel Drewes

## 2021-10-30 NOTE — Interval H&P Note (Signed)
History and Physical Interval Note:  10/30/2021 1:27 PM  Roberto [name]  has presented today for surgery, with the diagnosis of BACTEREMIA.  The various methods of treatment have been discussed with the patient and family. After consideration of risks, benefits and other options for treatment, the patient has consented to  Procedure(s): TRANSESOPHAGEAL ECHOCARDIOGRAM (TEE) (N/A) as a surgical intervention.  The patient's history has been reviewed, patient examined, no change in status, stable for surgery.  I have reviewed the patient's chart and labs.  Questions were answered to the patient's satisfaction.     Fronie Holstein Cristal Deer

## 2021-10-30 NOTE — Progress Notes (Signed)
[facility] New Study Start: OCEANIC-STROKE   SUMMARY For more information refer to: [email or web address] Study Identifier: [id number]    A Multicenter, International, Randomized, Placebo Controlled, Double-blind, Parallel Group and Event Driven Phase 3 Study of the Oral FXIa Inhibitor Asundexian ([id number]) for the Prevention of Ischemic Stroke in Male and Male Participants Aged 71 Years and Older After an Acute Non-cardioembolic Ischemic Stroke or High-risk TIA  Brief Summary This study will randomize adult patients with an initial diagnosis of acute non-cardioembolic ischemic stroke or high-risk transient ischemic attack (TIA) to treatment within 72 hours of symptom onset and with the intention to be treated with antiplatelet therapy.  Design Phase 3, Randomized, Interventional, Event-Driven  Intervention Asundexian ([id number]) 50 mg tablets or matching placebo tablets (pink, oval, film-coated tablets  Concomitant Therapy Participants are to receive antiplatelet therapy (single or dual; provider discretion) during the study conduct.  Prohibited Therapy Oral anticoagulation Full dose and/or long-term anticoagulation therapy with heparin or LMWH  Chronic (more than 4 weeks continuous) therapy with NSAIDs during the study conduct Concomitant use of combined P-gp and strong/moderate CYP3A4 inducers Concomitant use of combined P-gp and strong CYP3A4 inhibitors Herbal/traditional medications and/or supplements with known anticoagulant/antiplatelet effects  Anticoagulation Prophylaxis Venous thromboembolism prophylaxis with LMWH or unfractionated heparin for short periods of time (2 weeks) is allowed.  Potential Drug-Drug Interactions Rosuvastatin 20 mg daily or higher or atorvastatin 80 mg (additional monitoring may be warranted due to increased concentrations of rosuvastatin and atorvastatin - asundexian ([id number]) is a weak inhibitor of CYP2C8)  Administration   Can be taken irrespective of food intake. Should be swallowed whole with water, preferably in the morning. CANNOT be crushed or broken.      Plan: Start Concha [place] [phone]) 50 mg tablets or placebo] daily, preferably in the morning. Study medication must be picked up from pharmacy, medication can not be tubed.    Please contact [facility] if any questions or concerns regarding the study medication.    Charlett Nose, PharmD, BCPS [facility] Drug [facility] Pharmacist  [phone]

## 2021-10-30 NOTE — Progress Notes (Signed)
  Echocardiogram Echocardiogram Transesophageal has been performed.  Roberto [name] 10/30/2021, 2:38 PM

## 2021-10-30 NOTE — Progress Notes (Signed)
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   #Hospital associated MRSA  and Strep pyogenes bacteremia #Right 5th finger tenderness #Poor dentition #Hx of recent contact dermatitis(R fore-arm) due to poison IV #CVA -He developed a significant leukocytosis on day 4 of hospitalization. R PIV(6/9-6/13). Mild erythema/tenderness at the medial right antecubital site noted, pt reports that it is the same site where he had poison ivy and was scratching. Unclear if this represents phlebitis vs irritation from pruritis/scratching, will get [place].   -More likely he was bacteremic coming into hospitalization. He had poison IV about a week ago (Monday), which he has been scratching, mostly on right forearm. He could have introduced bacteremia through those wounds. Noted to have tender right 5 th finger and poor dentition.  -TTE showed no vegetation Recommendations:  -Continue vancomycin -D/C clindamycin after TEE today -Tee today -Follow repeat blood Cx to ensure clearance -RUE [place] -CT maxillofacial(unable to do orthopantogram) -Right hand MRI, pt declined overnight because it was too early. Please re-attempt to obtain  MRI  Microbiology:   Antibiotics: Cefepime 6/11-6/12 Metronidazole 6/12 Unasyn 6/13 Ceftriaxone 6/13-p Vancomycin 6/13-p   Cultures: Blood 6/13 MRSA and strep pyogenes 6/14 NG SUBJECTIVE: Resting in bed. No new complaints. Interval: wbc 7.9, afebrile overnight.   Review of Systems: Review of Systems  All other systems reviewed and are negative.    Scheduled Meds:  aspirin   81 mg Oral Daily   atorvastatin  80 mg Oral Daily   clopidogrel  75 mg Oral Daily   melatonin  3 mg Oral QHS   OCEANIC-STROKE asundexian or placebo  1 tablet Oral Daily   Continuous Infusions:  vancomycin 1,500 mg (10/30/21 1124)   PRN Meds:.acetaminophen, calamine, loperamide No Known Allergies  OBJECTIVE: Vitals:   10/29/21 2035 10/30/21 0058 10/30/21 0622 10/30/21 0900  BP: (!) 138/93 117/71 130/78 (!) 142/78  Pulse: 77 76 71 73  Resp: 19 17 17 16   Temp: 99.4 F (37.4 C) 99.5 F (37.5 C) 97.8 F (36.6 C) 98.2 F (36.8 C)  TempSrc: Oral Oral Oral Oral  SpO2: 98% 96% 97% 96%  Weight:      Height:       Body mass index is 27.43 kg/m.  Physical Exam Constitutional:      General: He is not in acute distress.    Appearance: He is normal weight. He is not toxic-appearing.  HENT:     Head: Normocephalic and atraumatic.     Right Ear: External ear normal.     Left Ear: External ear normal.     Nose: No congestion or rhinorrhea.     Mouth/Throat:     Mouth: Mucous membranes are moist.     Pharynx: Oropharynx is clear.  Eyes:     Extraocular Movements: Extraocular movements intact.     Conjunctiva/sclera: Conjunctivae normal.     Pupils: Pupils are equal, round, and reactive to light.  Cardiovascular:     Rate and Rhythm: Normal rate and regular rhythm.     Heart sounds:  No murmur heard.    No friction rub. No gallop.  Pulmonary:     Effort: Pulmonary effort is normal.     Breath sounds: Normal breath sounds.  Abdominal:     General: Abdomen is flat. Bowel sounds are normal.     Palpations: Abdomen is soft.  Musculoskeletal:        General: No swelling. Normal range of motion.     Cervical back: Normal range of motion and neck supple.  Skin:    General: Skin is warm and dry.     Comments: Right forearms with erythema and right 5 finger tender  Neurological:     General: No focal deficit present.     Mental Status: He is oriented to person, place, and time.   Psychiatric:        Mood and Affect: Mood normal.       Lab Results Lab Results  Component Value Date   WBC 7.9 10/30/2021   HGB 16.4 10/30/2021   HCT 46.8 10/30/2021   MCV 91.2 10/30/2021   PLT 164 10/30/2021    Lab Results  Component Value Date   CREATININE 0.85 10/30/2021   BUN 18 10/30/2021   NA 133 (L) 10/30/2021   K 3.5 10/30/2021   CL 103 10/30/2021   CO2 21 (L) 10/30/2021    Lab Results  Component Value Date   ALT 24 10/27/2021   AST 23 10/27/2021   ALKPHOS 63 10/27/2021   BILITOT 1.4 (H) 10/27/2021        Danelle Earthly, MD [facility] 10/30/2021, 12:13 PM

## 2021-10-30 NOTE — Progress Notes (Signed)
STROKE TEAM PROGRESS NOTE   SUBJECTIVE (INTERVAL HISTORY) His wife is at the bedside.  Patient no acute event overnight, neuro stable, unchanged.  Just had TEE done today showed no endocarditis.   OBJECTIVE Temp:  [97.8 F (36.6 C)-99.5 F (37.5 C)] 98.2 F (36.8 C) (06/16 1511) Pulse Rate:  [69-78] 74 (06/16 1511) Cardiac Rhythm: Normal sinus rhythm (06/16 0735) Resp:  [14-23] 22 (06/16 1511) BP: (117-155)/(60-93) 155/85 (06/16 1511) SpO2:  [93 %-98 %] 97 % (06/16 1511)  Recent Labs  Lab 10/26/21 1922  GLUCAP 139*   Recent Labs  Lab 10/26/21 0402 10/27/21 0241 10/28/21 0149 10/29/21 0231 10/30/21 0219  NA 131* 131* 129* 129* 133*  K 4.1 4.1 3.6 3.9 3.5  CL 102 98 104 100 103  CO2 20* 19* 17* 20* 21*  GLUCOSE 106* 112* 120* 108* 126*  BUN 14 26* 28* 24* 18  CREATININE 0.99 1.50* 1.04 0.85 0.85  CALCIUM 8.4* 8.9 8.2* 8.0* 8.2*   Recent Labs  Lab 10/27/21 0353  AST 23  ALT 24  ALKPHOS 63  BILITOT 1.4*  PROT 7.4  ALBUMIN 3.2*   Recent Labs  Lab 10/27/21 0241 10/27/21 0353 10/28/21 0149 10/29/21 0231 10/30/21 0219  WBC 27.2* 27.4* 11.1* 8.8 7.9  NEUTROABS  --  23.9*  --   --   --   HGB 18.7* 18.8* 16.8 16.3 16.4  HCT 54.2* 53.8* 48.4 46.3 46.8  MCV 93.3 92.1 92.7 91.7 91.2  PLT 203 202 162 151 164   No results for input(s): "CKTOTAL", "CKMB", "CKMBINDEX", "TROPONINI" in the last 168 hours. No results for input(s): "LABPROT", "INR" in the last 72 hours. No results for input(s): "COLORURINE", "LABSPEC", "PHURINE", "GLUCOSEU", "HGBUR", "BILIRUBINUR", "KETONESUR", "PROTEINUR", "UROBILINOGEN", "NITRITE", "LEUKOCYTESUR" in the last 72 hours.  Invalid input(s): "APPERANCEUR"     Component Value Date/Time   CHOL 173 10/23/2021 1247   TRIG 272 (H) 10/23/2021 1247   HDL 67 10/23/2021 1247   CHOLHDL 2.6 10/23/2021 1247   VLDL 54 (H) 10/23/2021 1247   LDLCALC 52 10/23/2021 1247   Lab Results  Component Value Date   HGBA1C 5.8 (H) 10/23/2021       Component Value Date/Time   LABOPIA NONE DETECTED 12/09/2016 0900   COCAINSCRNUR NONE DETECTED 12/09/2016 0900   LABBENZ NONE DETECTED 12/09/2016 0900   AMPHETMU NONE DETECTED 12/09/2016 0900   THCU NONE DETECTED 12/09/2016 0900   LABBARB NONE DETECTED 12/09/2016 0900    No results for input(s): "ETH" in the last 168 hours.  I have personally reviewed the radiological images below and agree with the radiology interpretations.  MR BRAIN WO CONTRAST  Result Date: 10/28/2021 CLINICAL DATA:  Follow-up examination for stroke. EXAM: MRI HEAD WITHOUT CONTRAST TECHNIQUE: Multiplanar, multiecho pulse sequences of the brain and surrounding structures were obtained without intravenous contrast. COMPARISON:  Comparison made with previous MRI from 10/23/2021. FINDINGS: Brain: Examination moderately degraded by motion artifact. There has been interval worsening and expansion of previously identified right PCA distribution infarct since previous MRI, now fairly large in size and involving the majority of the right PCA distribution.Minimal associated petechial blood products without frank hemorrhagic transformation (series 7, image 64) Heidelberg classification 1a: HI1, scattered small petechiae, no mass effect. No other definite acute or subacute infarct identified. No made of an apparent punctate focus of diffusion abnormality involving the left ventral pons on axial image 13, not convincingly seen on corresponding sequences, and favored to be artifactual. Gray-white matter differentiation otherwise maintained. No other acute  or chronic intracranial blood products. Underlying age-related cerebral atrophy with mild chronic small vessel ischemic disease again noted. No mass lesion or midline shift. No extra-axial fluid collection. No hydrocephalus. Pituitary gland suprasellar region grossly within normal limits. Vascular: Major intracranial vascular flow voids are maintained at the skull base. Skull and upper  cervical spine: Craniocervical junction with normal limits. Bone marrow signal intensity grossly within normal limits. No scalp soft tissue abnormality. Sinuses/Orbits: Globes orbital soft tissues demonstrate no acute finding. Paranasal sinuses are largely clear. No significant mastoid effusion. Other: None. IMPRESSION: 1. Motion degraded exam. 2. Interval worsening and expansion of previously identified right PCA distribution infarct, now fairly large in and involving the majority of the right PCA distribution. Minimal associated petechial blood products without frank hemorrhagic transformation. 3. No other new acute intracranial abnormality. Electronically Signed   By: Jeannine Boga M.D.   On: 10/28/2021 03:47   DG CHEST PORT 1 VIEW  Result Date: 10/27/2021 CLINICAL DATA:  [id number] shortness of breath and hypoxia EXAM: PORTABLE CHEST 1 VIEW COMPARISON:  Chest x-ray from yesterday FINDINGS: The heart size and mediastinal contours are within normal limits. Stable atheromatous calcifications at the arch of the aorta low lung volumes. Minimal atelectasis at the left lung base. No focal consolidation, pleural effusion or vascular congestion. The visualized skeletal structures are unremarkable. IMPRESSION: Minimal atelectasis at the left lung base. Low lung volumes. There has been no significant interval change. Electronically Signed   By: Frazier Richards M.D.   On: 10/27/2021 11:19   DG CHEST PORT 1 VIEW  Result Date: 10/26/2021 CLINICAL DATA:  Altered mental status. EXAM: PORTABLE CHEST 1 VIEW COMPARISON:  Chest x-ray 12/08/2016. FINDINGS: The heart size and mediastinal contours are within normal limits. There are atherosclerotic calcifications of the aorta. Both lungs are clear. The visualized skeletal structures are unremarkable. IMPRESSION: No active disease. Electronically Signed   By: Ronney Asters M.D.   On: 10/26/2021 23:45   CT ANGIO HEAD CODE STROKE  Result Date: 10/26/2021 CLINICAL DATA:   Initial evaluation for neuro deficit, stroke suspected. EXAM: CT ANGIOGRAPHY HEAD AND NECK TECHNIQUE: Multidetector CT imaging of the head and neck was performed using the standard protocol during bolus administration of intravenous contrast. Multiplanar CT image reconstructions and MIPs were obtained to evaluate the vascular anatomy. Carotid stenosis measurements (when applicable) are obtained utilizing NASCET criteria, using the distal internal carotid diameter as the denominator. RADIATION DOSE REDUCTION: This exam was performed according to the departmental dose-optimization program which includes automated exposure control, adjustment of the mA and/or kV according to patient size and/or use of iterative reconstruction technique. CONTRAST:  53mL OMNIPAQUE IOHEXOL 350 MG/ML SOLN COMPARISON:  Prior CT from earlier the same day as well as earlier studies. FINDINGS: CTA NECK FINDINGS Aortic arch: Visualized aortic arch normal in caliber with standard branching pattern. Moderate aortic atherosclerosis. No significant stenosis about the origin the great vessels. Right carotid system: Right common and internal carotid arteries patent without significant stenosis or dissection. Eccentric atheromatous plaque about the right carotid bulb without significant stenosis. Left carotid system: Left CCA patent without stenosis. Predominant soft plaque at the left carotid bulb/proximal left ICA without hemodynamically significant stenosis. Left ICA patent distally without stenosis or dissection. Vertebral arteries: Both vertebral arteries arise from the subclavian arteries. No proximal subclavian artery stenosis. Both vertebral arteries widely patent without stenosis, dissection or occlusion. Skeleton: No discrete or worrisome osseous lesions. Advanced degenerative spondylosis present at C4-5 through C6-7. Grade 1 facet mediated anterolisthesis  of C7 on T1. Poor dentition noted. Other neck: No other acute soft tissue abnormality  within the neck. Upper chest: Visualized upper chest demonstrates no acute finding. Review of the MIP images confirms the above findings CTA HEAD FINDINGS Anterior circulation: Petrous segments patent bilaterally. Scattered atheromatous change within the carotid siphons without hemodynamically significant stenosis. A1 segments, anterior communicating artery complex common anterior cerebral arteries patent without stenosis. No M1 stenosis or occlusion. No proximal MCA branch occlusion. Distal MCA branches perfused and symmetric. Posterior circulation: Both vertebral arteries patent without stenosis. Both PICA patent at their origins. Basilar patent to its distal aspect without stenosis. Superior cerebellar arteries patent bilaterally. Left PCA supplied via a hypoplastic left P1 segment and robust left posterior communicating artery. Focal moderate proximal left P2 stenosis noted (series 12, image 21). Left PCA otherwise patent to its distal aspect. Right PCA appears to largely occluded at the origin of the right P2 segment. Finding is in keeping with the evolving early subacute right PCA distribution infarct. Venous sinuses: Grossly patent allowing for timing the contrast bolus. Anatomic variants: None significant. Review of the MIP images confirms the above findings IMPRESSION: 1. Occlusion of the proximal right P2 segment, in keeping with the evolving early subacute right PCA distribution infarct. 2. No other emergent large vessel occlusion. 3. Moderate proximal left P2 stenosis. 4. Mild-to-moderate atheromatous change about the carotid bifurcations and carotid siphons. No other hemodynamically significant or correctable stenosis. 5. Aortic Atherosclerosis (ICD10-I70.0). These results were communicated to Dr. Cheral Marker at 8:36 pm on 10/26/2021 by text page via the [facility] messaging system. Electronically Signed   By: Jeannine Boga M.D.   On: 10/26/2021 20:38   CT ANGIO NECK CODE STROKE  Result Date:  10/26/2021 CLINICAL DATA:  Initial evaluation for neuro deficit, stroke suspected. EXAM: CT ANGIOGRAPHY HEAD AND NECK TECHNIQUE: Multidetector CT imaging of the head and neck was performed using the standard protocol during bolus administration of intravenous contrast. Multiplanar CT image reconstructions and MIPs were obtained to evaluate the vascular anatomy. Carotid stenosis measurements (when applicable) are obtained utilizing NASCET criteria, using the distal internal carotid diameter as the denominator. RADIATION DOSE REDUCTION: This exam was performed according to the departmental dose-optimization program which includes automated exposure control, adjustment of the mA and/or kV according to patient size and/or use of iterative reconstruction technique. CONTRAST:  33mL OMNIPAQUE IOHEXOL 350 MG/ML SOLN COMPARISON:  Prior CT from earlier the same day as well as earlier studies. FINDINGS: CTA NECK FINDINGS Aortic arch: Visualized aortic arch normal in caliber with standard branching pattern. Moderate aortic atherosclerosis. No significant stenosis about the origin the great vessels. Right carotid system: Right common and internal carotid arteries patent without significant stenosis or dissection. Eccentric atheromatous plaque about the right carotid bulb without significant stenosis. Left carotid system: Left CCA patent without stenosis. Predominant soft plaque at the left carotid bulb/proximal left ICA without hemodynamically significant stenosis. Left ICA patent distally without stenosis or dissection. Vertebral arteries: Both vertebral arteries arise from the subclavian arteries. No proximal subclavian artery stenosis. Both vertebral arteries widely patent without stenosis, dissection or occlusion. Skeleton: No discrete or worrisome osseous lesions. Advanced degenerative spondylosis present at C4-5 through C6-7. Grade 1 facet mediated anterolisthesis of C7 on T1. Poor dentition noted. Other neck: No other  acute soft tissue abnormality within the neck. Upper chest: Visualized upper chest demonstrates no acute finding. Review of the MIP images confirms the above findings CTA HEAD FINDINGS Anterior circulation: Petrous segments patent bilaterally. Scattered atheromatous  change within the carotid siphons without hemodynamically significant stenosis. A1 segments, anterior communicating artery complex common anterior cerebral arteries patent without stenosis. No M1 stenosis or occlusion. No proximal MCA branch occlusion. Distal MCA branches perfused and symmetric. Posterior circulation: Both vertebral arteries patent without stenosis. Both PICA patent at their origins. Basilar patent to its distal aspect without stenosis. Superior cerebellar arteries patent bilaterally. Left PCA supplied via a hypoplastic left P1 segment and robust left posterior communicating artery. Focal moderate proximal left P2 stenosis noted (series 12, image 21). Left PCA otherwise patent to its distal aspect. Right PCA appears to largely occluded at the origin of the right P2 segment. Finding is in keeping with the evolving early subacute right PCA distribution infarct. Venous sinuses: Grossly patent allowing for timing the contrast bolus. Anatomic variants: None significant. Review of the MIP images confirms the above findings IMPRESSION: 1. Occlusion of the proximal right P2 segment, in keeping with the evolving early subacute right PCA distribution infarct. 2. No other emergent large vessel occlusion. 3. Moderate proximal left P2 stenosis. 4. Mild-to-moderate atheromatous change about the carotid bifurcations and carotid siphons. No other hemodynamically significant or correctable stenosis. 5. Aortic Atherosclerosis (ICD10-I70.0). These results were communicated to Dr. Cheral Marker at 8:36 pm on 10/26/2021 by text page via the [facility] messaging system. Electronically Signed   By: Jeannine Boga M.D.   On: 10/26/2021 20:38   CT HEAD CODE STROKE  WO CONTRAST`  Result Date: 10/26/2021 CLINICAL DATA:  Code stroke. EXAM: CT HEAD WITHOUT CONTRAST TECHNIQUE: Contiguous axial images were obtained from the base of the skull through the vertex without intravenous contrast. RADIATION DOSE REDUCTION: This exam was performed according to the departmental dose-optimization program which includes automated exposure control, adjustment of the mA and/or kV according to patient size and/or use of iterative reconstruction technique. COMPARISON:  Prior study from 10/24/2021. FINDINGS: Brain: Evolving cytotoxic edema involving the mesial right temporal occipital region and right thalamus, compatible with early subacute right PCA distribution infarct, increased in size as compared to previous. No associated hemorrhage or significant regional mass effect. No other new large vessel territory infarct. No other acute intracranial hemorrhage. No mass lesion or midline shift. No hydrocephalus or extra-axial fluid collection. Vascular: No hyperdense vessel. Skull: Scalp soft tissues and calvarium demonstrate no acute finding. Sinuses/Orbits: /globes and orbital soft tissues within normal limits. Paranasal sinuses and mastoid air cells are clear. Other: None. ASPECTS [facility] Stroke Program Early CT Score) - Ganglionic level infarction (caudate, lentiform nuclei, internal capsule, insula, M1-M3 cortex): 7 - Supraganglionic infarction (M4-M6 cortex): 3 Total score (0-10 with 10 being normal): 10 IMPRESSION: 1. Increased size of evolving early subacute right PCA distribution infarct. No evidence for hemorrhagic transformation or significant regional mass effect. 2. No other new acute intracranial abnormality. 3. ASPECTS is 10. These results were communicated to Dr. Cheral Marker at 8:01 pm on 10/26/2021 by text page via the [facility] messaging system. Electronically Signed   By: Jeannine Boga M.D.   On: 10/26/2021 20:04   ECHOCARDIOGRAM COMPLETE  Result Date: 10/24/2021     ECHOCARDIOGRAM REPORT   Patient Name:   PRENTIS DEPERALTA Date of Exam: 10/24/2021 Medical Rec #:  [id number]   Height:       66.0 in Accession #:    [id number]  Weight:       170.0 lb Date of Birth:  08-Sep-1950   BSA:          1.866 m Patient Age:    87 years  BP:           146/96 mmHg Patient Gender: M           HR:           73 bpm. Exam Location:  Inpatient Procedure: 2D Echo, Cardiac Doppler and Color Doppler Indications:    Stroke  History:        Patient has prior history of Echocardiogram examinations, most                 recent 12/14/2016. Stroke.  Sonographer:    Merrie Roof RDCS Referring Phys: [phone] [place]  1. Left ventricular ejection fraction, by estimation, is 60 to 65%. The left ventricle has normal function. The left ventricle has no regional wall motion abnormalities. Left ventricular diastolic parameters are consistent with Grade I diastolic dysfunction (impaired relaxation).  2. Right ventricular systolic function is normal. The right ventricular size is normal.  3. The mitral valve is normal in structure. No evidence of mitral valve regurgitation. No evidence of mitral stenosis.  4. The aortic valve is tricuspid. Aortic valve regurgitation is not visualized. Aortic valve sclerosis is present, with no evidence of aortic valve stenosis.  5. The inferior vena cava is normal in size with greater than 50% respiratory variability, suggesting right atrial pressure of 3 mmHg. FINDINGS  Left Ventricle: Left ventricular ejection fraction, by estimation, is 60 to 65%. The left ventricle has normal function. The left ventricle has no regional wall motion abnormalities. The left ventricular internal cavity size was normal in size. There is  no left ventricular hypertrophy. Left ventricular diastolic parameters are consistent with Grade I diastolic dysfunction (impaired relaxation). Right Ventricle: The right ventricular size is normal. Right ventricular systolic function is normal. Left  Atrium: Left atrial size was normal in size. Right Atrium: Right atrial size was normal in size. Pericardium: There is no evidence of pericardial effusion. Mitral Valve: The mitral valve is normal in structure. No evidence of mitral valve regurgitation. No evidence of mitral valve stenosis. Tricuspid Valve: The tricuspid valve is normal in structure. Tricuspid valve regurgitation is not demonstrated. No evidence of tricuspid stenosis. Aortic Valve: The aortic valve is tricuspid. Aortic valve regurgitation is not visualized. Aortic valve sclerosis is present, with no evidence of aortic valve stenosis. Aortic valve mean gradient measures 1.0 mmHg. Aortic valve peak gradient measures 2.8  mmHg. Aortic valve area, by VTI measures 2.65 cm. Pulmonic Valve: The pulmonic valve was normal in structure. Pulmonic valve regurgitation is not visualized. No evidence of pulmonic stenosis. Aorta: The aortic root is normal in size and structure. Venous: The inferior vena cava is normal in size with greater than 50% respiratory variability, suggesting right atrial pressure of 3 mmHg. IAS/Shunts: No atrial level shunt detected by color flow Doppler.  LEFT VENTRICLE PLAX 2D LVIDd:         3.60 cm   Diastology LVIDs:         2.60 cm   LV e' medial:    5.55 cm/s LV PW:         1.10 cm   LV E/e' medial:  6.3 LV IVS:        1.20 cm   LV e' lateral:   6.74 cm/s LVOT diam:     2.00 cm   LV E/e' lateral: 5.2 LV SV:         42 LV SV Index:   23 LVOT Area:     3.14 cm  RIGHT VENTRICLE  RV Basal diam:  3.50 cm RV S prime:     11.50 cm/s TAPSE (M-mode): 2.1 cm LEFT ATRIUM             Index        RIGHT ATRIUM           Index LA diam:        3.70 cm 1.98 cm/m   RA Area:     21.30 cm LA Vol (A2C):   59.2 ml 31.72 ml/m  RA Volume:   64.10 ml  34.35 ml/m LA Vol (A4C):   42.3 ml 22.67 ml/m LA Biplane Vol: 50.7 ml 27.17 ml/m  AORTIC VALVE AV Area (Vmax):    2.64 cm AV Area (Vmean):   2.62 cm AV Area (VTI):     2.65 cm AV Vmax:           83.40  cm/s AV Vmean:          56.500 cm/s AV VTI:            0.159 m AV Peak Grad:      2.8 mmHg AV Mean Grad:      1.0 mmHg LVOT Vmax:         70.20 cm/s LVOT Vmean:        47.200 cm/s LVOT VTI:          0.134 m LVOT/AV VTI ratio: 0.84  AORTA Ao Root diam: 3.60 cm Ao Asc diam:  3.50 cm MITRAL VALVE MV Area (PHT): 3.91 cm    SHUNTS MV Decel Time: 194 msec    Systemic VTI:  0.13 m MV E velocity: 35.10 cm/s  Systemic Diam: 2.00 cm MV A velocity: 88.00 cm/s MV E/A ratio:  0.40 Kirk Ruths MD Electronically signed by Kirk Ruths MD Signature Date/Time: 10/24/2021/5:14:34 PM    Final    CT HEAD WO CONTRAST (5MM)  Result Date: 10/24/2021 CLINICAL DATA:  Neuro deficit with acute stroke suspected. Worsening symptoms EXAM: CT HEAD WITHOUT CONTRAST TECHNIQUE: Contiguous axial images were obtained from the base of the skull through the vertex without intravenous contrast. RADIATION DOSE REDUCTION: This exam was performed according to the departmental dose-optimization program which includes automated exposure control, adjustment of the mA and/or kV according to patient size and/or use of iterative reconstruction technique. COMPARISON:  Brain MRI from yesterday FINDINGS: Brain: Known acute infarction in the right PCA distribution affecting thalamus and occipital cortex. No hemorrhagic conversion or new territory of involvement seen. There may be more confluent right occipital inferior cortex involvement when compared to the preceding brain MRI. No hemorrhage, hydrocephalus, or masslike finding Vascular: No hyperdense vessel or unexpected calcification. Skull: Normal. Negative for fracture or focal lesion. Sinuses/Orbits: No acute finding. IMPRESSION: Known acute infarction in the right PCA distribution. There may be more confluent involvement in the right occipital cortex than on the preceding brain MRI. No hemorrhagic conversion or new territory distribution. Electronically Signed   By: Jorje Guild M.D.   On:  10/24/2021 09:55   MR BRAIN WO CONTRAST  Result Date: 10/23/2021 CLINICAL DATA:  Acute neurologic deficit EXAM: MRI HEAD WITHOUT CONTRAST TECHNIQUE: Multiplanar, multiecho pulse sequences of the brain and surrounding structures were obtained without intravenous contrast. COMPARISON:  12/09/2016 FINDINGS: Brain: There are areas of abnormal diffusion restriction within the right PCA territory, involving the dorsal thalamus, medial right temporal lobe and the right occipital lobe. No acute or chronic hemorrhage. There is multifocal hyperintense T2-weighted signal within the white matter. Generalized cerebral  volume loss. The midline structures are normal. Vascular: Major flow voids are preserved. Skull and upper cervical spine: Normal calvarium and skull base. Visualized upper cervical spine and soft tissues are normal. Sinuses/Orbits:No paranasal sinus fluid levels or advanced mucosal thickening. No mastoid or middle ear effusion. Normal orbits. IMPRESSION: Multifocal acute ischemia within the right PCA territory, involving the dorsal thalamus, medial right temporal lobe and the right occipital lobe. No hemorrhage or mass effect. Electronically Signed   By: Deatra Robinson M.D.   On: 10/23/2021 19:49   CT ANGIO HEAD NECK W WO CM W PERF (CODE STROKE)  Result Date: 10/23/2021 CLINICAL DATA:  Stroke suspected EXAM: CT ANGIOGRAPHY HEAD AND NECK CT PERFUSION BRAIN TECHNIQUE: Multidetector CT imaging of the head and neck was performed using the standard protocol during bolus administration of intravenous contrast. Multiplanar CT image reconstructions and MIPs were obtained to evaluate the vascular anatomy. Carotid stenosis measurements (when applicable) are obtained utilizing NASCET criteria, using the distal internal carotid diameter as the denominator. Multiphase CT imaging of the brain was performed following IV bolus contrast injection. Subsequent parametric perfusion maps were calculated using RAPID software.  RADIATION DOSE REDUCTION: This exam was performed according to the departmental dose-optimization program which includes automated exposure control, adjustment of the mA and/or kV according to patient size and/or use of iterative reconstruction technique. CONTRAST:  OMNIPAQUE IOHEXOL 350 MG/ML SOLN COMPARISON:  No prior CTA, correlation is made with 12/09/2016 MRA head and 10/23/2021 CT head FINDINGS: CT HEAD FINDINGS For noncontrast findings, please see same day CT head. CTA NECK FINDINGS Evaluation is limited by bolus timing and significant contrast in the venous system. Aortic arch: Standard branching. Imaged portion shows no evidence of aneurysm or dissection. No significant stenosis of the major arch vessel origins. Aortic atherosclerosis Right carotid system: No evidence of dissection, occlusion, or hemodynamically significant stenosis (greater than 50%). Atherosclerotic disease at the bifurcation and in the proximal ICA is not hemodynamically significant. Left carotid system: Approximally 75% luminal narrowing of the proximal left ICA secondary to primarily noncalcified plaque no evidence of dissection or occlusion. Vertebral arteries: No evidence of dissection, occlusion, or hemodynamically significant stenosis (greater than 50%). Skeleton: Degenerative changes in the cervical spine with significant disc height loss. Poor dentition with multifocal periapical lucency and dental caries. Other neck: No acute finding. Upper chest: No focal pulmonary opacity or pleural effusion in the imaged lungs. Review of the MIP images confirms the above findings CTA HEAD FINDINGS Evaluation is limited by bolus timing and degree of venous contamination. Anterior circulation: Both internal carotid arteries are patent to the termini, with mild narrowing in the bilateral cavernous carotids. A1 segments patent. Normal anterior communicating artery. Evaluation of the more distal anterior cerebral arteries is limited by poor  opacification but appears grossly patent to the distal aspects. No M1 stenosis or occlusion. Normal MCA bifurcations. Evaluation of the more distal MCAs is limited by poor opacification, but they appear grossly patent to their distal aspects. Posterior circulation: Vertebral arteries patent to the vertebrobasilar junction without stenosis. Basilar patent to its distal aspect. Superior cerebellar arteries are not well opacified. Patent P1 segments. PCAs perfused proximally but are poorly opacified past the anterior P2 segments and evaluation is significantly complicated by venous contamination. The bilateral posterior communicating arteries are not definitively visualized. Venous sinuses: As permitted by contrast timing, patent. Anatomic variants: None significant. Review of the MIP images confirms the above findings CT Brain Perfusion Findings: ASPECTS: 10 CBF (<30%) Volume: 53mL Perfusion (  Tmax>6.0s) volume: 185mL Mismatch Volume: 147mL Infarction Location:No infarct core. Decreased perfusion is noted in the bilateral cerebral and cerebellar hemispheres, not conforming to any particular vascular territory but possibly involving the bilateral watershed areas. Some or all of this may also be secondary to motion. IMPRESSION: 1. Evaluation is limited by poor bolus timing and venous contamination. Within this limitation, there is approximately 75% stenosis in the proximal left ICA. No other hemodynamically significant stenosis in the neck. 2. Evaluation of the distal intracranial vasculature is severely limited, as described above. The proximal intracranial vasculature appears patent. 3. On CT perfusion, no infarct core is detected, but there is decreased perfusion in the bilateral cerebral and cerebellar hemispheres, some of which may be in a watershed distribution; however, some or all be artifactual. Imaging results were communicated on 10/23/2021 at 1:48 pm to provider Dr. Leonie Man via secure text paging. Electronically  Signed   By: Merilyn Baba M.D.   On: 10/23/2021 13:49   CT HEAD CODE STROKE WO CONTRAST  Result Date: 10/23/2021 CLINICAL DATA:  Code stroke.  Stroke suspected EXAM: CT HEAD WITHOUT CONTRAST TECHNIQUE: Contiguous axial images were obtained from the base of the skull through the vertex without intravenous contrast. RADIATION DOSE REDUCTION: This exam was performed according to the departmental dose-optimization program which includes automated exposure control, adjustment of the mA and/or kV according to patient size and/or use of iterative reconstruction technique. COMPARISON:  12/08/2016 FINDINGS: Brain: No evidence of acute infarction, hemorrhage, cerebral edema, mass, mass effect, or midline shift. Ventricles and sulci are normal for age. No extra-axial fluid collection. Vascular: No hyperdense vessel. Skull: Negative for fracture or focal lesion. Sinuses/Orbits: No acute finding. Other: The mastoid air cells are well aerated. ASPECTS [facility] Stroke Program Early CT Score) - Ganglionic level infarction (caudate, lentiform nuclei, internal capsule, insula, M1-M3 cortex): 7 - Supraganglionic infarction (M4-M6 cortex): 3 Total score (0-10 with 10 being normal): 10 IMPRESSION: 1. No acute intracranial process. 2. ASPECTS is 10 Code stroke imaging results were communicated on 10/23/2021 at 12:52 pm to provider Dr. Leonie Man via secure text paging. Electronically Signed   By: Merilyn Baba M.D.   On: 10/23/2021 12:53     PHYSICAL EXAM  Temp:  [97.8 F (36.6 C)-99.5 F (37.5 C)] 98.2 F (36.8 C) (06/16 1511) Pulse Rate:  [69-78] 74 (06/16 1511) Resp:  [14-23] 22 (06/16 1511) BP: (117-155)/(60-93) 155/85 (06/16 1511) SpO2:  [93 %-98 %] 97 % (06/16 1511)  General - Well nourished, well developed, in no apparent distress.  Ophthalmologic - fundi not visualized due to noncooperation.  Cardiovascular - Regular rhythm and rate.  Neuro - awake alert, orientated x3, answer questions appropriately, no aphasia,  follows simple commands, mild dysarthria.  However left dense hemianopia, right gaze preference, left gaze incomplete, slight left facial droop, left upper extremity flaccid, left lower extremity 2/5.  Left-sided sensory loss. RUE and RLE 5/5, sensation intact. R FTN intact.    ASSESSMENT/PLAN Roberto [name] is a 71 y.o. male with history of hypertension, glaucoma, gout, TIA in 2018 admitted for left-sided numbness, left hand weakness and left leg gave out.  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   CTA head neck 6/9 showed left ICA 75% stenosis, poorly opacified past the anterior P2 segments and evaluation is significantly complicated by venous contamination. CT perfusion  negative.   MRI 6/9 showed right PCA including thalamus small scattered infarcts.   CT repeat 6/12 showed increased size of subacute right PCA infarct.   CT head and neck 6/12 repeat showed right P2 occlusion, left P2 moderate stenosis, left ICA significance and soft plaque but no significant stenosis.   MRI repeat 6/14 showed large complete right PCA infarct including thalamus. EF 60 to 65% TEE no endocarditis Recommend 30-day cardiac event monitoring vs. Loop recorder to rule out A-fib at discharge LDL 52 A1c 5.8 lovenox for VTE prophylaxis aspirin 325 mg daily prior to admission, now on aspirin and Plavix DAPT for 3 months given right P2 occlusion and then plavix alone.  Patient also enrolled to [facility] clinical trial, continue investigational drug. Patient counseled to be compliant with his antithrombotic medications Ongoing aggressive stroke risk factor management Therapy recommendations:  CIR Disposition:  pending  Bacteremia Sepsis/septic shock 6/12 spike WBC to 23.2, now normalized AKI with creatinine 1.5, now normalized Tmax  100.1 -> afebrile Blood culture positive for MRSA, ID consulted, currently on vancomycin, and off Rocephin. ID on board TEE no endocarditis  History of TIA TIA in 2018, MRI negative for acute infarct, carotid Doppler negative.  MRA showed left P2 stenosis.  History of hypertension  Hypotension with septic shock Stable now Long term BP goal normotensive Avoid low BP  Hyperlipidemia Home meds: Lipitor 40 LDL 52, goal < 70 Now on Lipitor 40 Continue statin at discharge  Other Stroke Risk Factors Advanced age  Other Active Problems gout  Hospital day # 6   Rosalin Hawking, MD PhD Stroke Neurology 10/30/2021 4:24 PM    To contact Stroke Continuity provider, please refer to [email or web address] After hours, contact General Neurology

## 2021-10-30 NOTE — Progress Notes (Signed)
Physical Therapy Treatment Patient Details Name: Roberto [name] MRN: [id number] DOB: [birth year] Today's Date: 10/30/2021   History of Present Illness 71 yo male admitted with L side facial and extremity numbness starting 6/7 MRI(+) multifocal acute ischemia R PCA territory involving dorsal thalmas, medial R temporal lobe, R occipial lobe. Code stroke activated 6/12, MRI revealing Increased size of evolving early subacute right PCA distribution infarct.  PMH CVA 2017 glaucoma, HTN    PT Comments    Patient was agreeable to PT treatment today and is progressing with activity tolerance this session. Patient was able to stand x 2 bouts with one person assistance. Pre-gait activity performed in standing with weight shifting and taking a side step with LLE. Standing tolerance of less than one minute with each bout of standing. Rest breaks required with activity. Recommend to continue PT to maximize independence and decrease caregiver burden.    Recommendations for follow up therapy are one component of a multi-disciplinary discharge planning process, led by the attending physician.  Recommendations may be updated based on patient status, additional functional criteria and insurance authorization.  Follow Up Recommendations  Acute inpatient rehab (3hours/day)     Assistance Recommended at Discharge Frequent or constant Supervision/Assistance  Patient can return home with the following Two people to help with walking and/or transfers;A little help with bathing/dressing/bathroom;Assistance with cooking/housework;Assist for transportation;Help with stairs or ramp for entrance   Equipment Recommendations   (to be determined)    Recommendations for Other Services Rehab consult     Precautions / Restrictions Precautions Precautions: Fall Restrictions Weight Bearing Restrictions: No     Mobility  Bed Mobility Overal bed mobility: Needs Assistance Bed Mobility: Supine to Sit, Sit to Supine      Supine to sit: Max assist Sit to supine: Mod assist   General bed mobility comments: assistance for LLE and trunk support provied. verbal cues for technique and sequencing. increased time and effort is required with all functional mobility tasks.    Transfers Overall transfer level: Needs assistance   Transfers: Sit to/from Stand Sit to Stand: Max assist           General transfer comment: 2 bouts of standing performed. lifting and lowering assistance required for standing as well as anterior weight shifting. patient is relying on the bed for posterior leg support as well. seated rest breaks required between bouts of activity. performed incremental scooting x 4 bouts to the left with moderate assistance with cues for technique, foot placement, hand placement.    Ambulation/Gait             Pre-gait activities: faciliation for weight shifting to right for side step with left leg. faciliation needed for advancement of the left leg. limited standing tolerance for progression of further mobility in standing.     Stairs             Wheelchair Mobility    Modified Rankin (Stroke Patients Only)       Balance Overall balance assessment: Needs assistance Sitting-balance support: Feet supported, Single extremity supported Sitting balance-Leahy Scale: Fair Sitting balance - Comments: patient has a right gaze preference noted in sitting position with decreased attention to the left. with external stimulation, patient with increased awareness of left side   Standing balance support: Single extremity supported Standing balance-Leahy Scale: Poor Standing balance comment: external support required to maintain midline standing balance with standing tolerance of less than one minute.  Cognition Arousal/Alertness: Awake/alert Behavior During Therapy: Flat affect, WFL for tasks assessed/performed Overall Cognitive Status:  Impaired/Different from baseline Area of Impairment: Safety/judgement, Problem solving                         Safety/Judgement: Decreased awareness of safety, Decreased awareness of deficits Awareness: Intellectual Problem Solving: Slow processing, Requires verbal cues, Requires tactile cues General Comments: patient rarely makes eye contact. he gives breif one-two word responses. withdrawn overall        Exercises General Exercises - Lower Extremity Ankle Circles/Pumps: AAROM, Strengthening, 10 reps, Supine, Left Long Arc Quad: AAROM, Strengthening, Left, 5 reps, Seated Straight Leg Raises: AAROM, Strengthening, Left, 10 reps, Sidelying Other Exercises Other Exercises: tactile and verbal cues for exercise technique.    General Comments General comments (skin integrity, edema, etc.): dizziness reported with seated level activity with fatigue. vitals remained stable with blood pressure 158/83, Sp02 95% on room air, and heart rate 75-90 with activity. dizziness subsides after return to bed      Pertinent Vitals/Pain Pain Assessment Pain Assessment: No/denies pain    Home Living                          Prior Function            PT Goals (current goals can now be found in the care plan section) Acute Rehab PT Goals Patient Stated Goal: to walk, return to work PT Goal Formulation: With patient Time For Goal Achievement: 11/07/21 Potential to Achieve Goals: Good Progress towards PT goals: Progressing toward goals    Frequency    Min 4X/week      PT Plan Current plan remains appropriate    Co-evaluation              AM-PAC PT "6 Clicks" Mobility   Outcome Measure  Help needed turning from your back to your side while in a flat bed without using bedrails?: A Lot Help needed moving from lying on your back to sitting on the side of a flat bed without using bedrails?: A Lot Help needed moving to and from a bed to a chair (including a  wheelchair)?: A Lot Help needed standing up from a chair using your arms (e.g., wheelchair or bedside chair)?: A Lot Help needed to walk in hospital room?: Total Help needed climbing 3-5 steps with a railing? : Total 6 Click Score: 10    End of Session Equipment Utilized During Treatment: Gait belt Activity Tolerance: Patient tolerated treatment well Patient left: in bed;with call bell/phone within reach;with bed alarm set   PT Visit Diagnosis: Unsteadiness on feet (R26.81);Other abnormalities of gait and mobility (R26.89);Difficulty in walking, not elsewhere classified (R26.2);Other symptoms and signs involving the nervous system (R29.898)     Time: 6568-1275 PT Time Calculation (min) (ACUTE ONLY): 23 min  Charges:  $Therapeutic Exercise: 8-22 mins $Therapeutic Activity: 8-22 mins                    Donna Bernard, PT, MPT    [name] 10/30/2021, 1:51 PM

## 2021-10-30 NOTE — Progress Notes (Signed)
RUE venous duplex has been completed.   Results can be found under chart review under CV PROC. 10/30/2021 4:37 PM Braelon Sprung RVT, RDMS

## 2021-10-30 NOTE — CV Procedure (Signed)
    TRANSESOPHAGEAL ECHOCARDIOGRAM   NAME:  Roberto [name]   MRN: [id number] DOB:  [birth year]   ADMIT DATE: 10/23/2021  INDICATIONS: CVA, bacteremia  PROCEDURE:   Informed consent was obtained prior to the procedure. The risks, benefits and alternatives for the procedure were discussed and the patient comprehended these risks.  Risks include, but are not limited to, cough, sore throat, vomiting, nausea, somnolence, esophageal and stomach trauma or perforation, bleeding, low blood pressure, aspiration, pneumonia, infection, trauma to the teeth and death.    Procedural time out performed. Patient received monitored anesthesia care under the supervision of Dr. Armond Hang. Patient received a total of 298 mg propofol during the procedure.  The transesophageal probe was inserted in the esophagus and stomach without difficulty and multiple views were obtained.    COMPLICATIONS:    There were no immediate complications.  FINDINGS:  LEFT VENTRICLE: EF = 60-65%. No regional wall motion abnormalities.  RIGHT VENTRICLE: Normal size and function.   LEFT ATRIUM: No thrombus/mass.  LEFT ATRIAL APPENDAGE: No thrombus/mass.   RIGHT ATRIUM: No thrombus/mass.  AORTIC VALVE:  Trileaflet. No regurgitation. No vegetation.  MITRAL VALVE:    Normal structure. Trivial regurgitation. No vegetation.  TRICUSPID VALVE: Normal structure. No significant regurgitation. No vegetation.  PULMONIC VALVE: Grossly normal structure. No significant regurgitation. No apparent vegetation.  INTERATRIAL SEPTUM: Trivial PFO seen by color Doppler. Agitated saline contrast was weakly positive (few bubbles but within 2-3 cardiac cycles), suggesting right to left shunt  PERICARDIUM: No effusion noted.  DESCENDING AORTA: Moderate diffuse plaque seen   CONCLUSION: No evidence of endocarditis. 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   Jodelle Red, MD, PhD [facility]  [address], [address] [place], [place] [address] [phone]   1:58 PM

## 2021-10-30 NOTE — Progress Notes (Signed)
Transport at bedside to take pt to CT scan, pt refuses provider K[place]ous notified. Provider came to bedside to talk to pt. Pt still declines to go. Will continue to monitor , call bell within reach.

## 2021-10-30 NOTE — Progress Notes (Signed)
Pt returned from procedure.   10/30/21 1439  Vitals  BP 133/90  MAP (mmHg) 103  Pulse Rate 71  ECG Heart Rate 72  Resp (!) 23  MEWS COLOR  MEWS Score Color Green  Oxygen Therapy  SpO2 98 %  MEWS Score  MEWS Temp 0  MEWS Systolic 0  MEWS Pulse 0  MEWS RR 1  MEWS LOC 0  MEWS Score 1

## 2021-10-30 NOTE — Progress Notes (Signed)
Inpatient Rehabilitation Admissions Coordinator   I await further progress with therapy. Noted has done little with transfers or up in chair to date. CIR vs SNF depending on his abilities demonstrated to tolerate more intensive therapies. I will follow up n Monday.  Ottie Glazier, RN, MSN Rehab Admissions Coordinator [phone] 10/30/2021 12:44 PM

## 2021-10-30 NOTE — Plan of Care (Signed)
Pt had a TEE done today. Sign. Other at bedside. Pt wants to go home. Primofit in place. Tele in place.   Problem: Education: Goal: Knowledge of General Education information will improve Description: Including pain rating scale, medication(s)/side effects and non-pharmacologic comfort measures Outcome: Progressing   Problem: Health Behavior/Discharge Planning: Goal: Ability to manage health-related needs will improve Outcome: Progressing   Problem: Clinical Measurements: Goal: Ability to maintain clinical measurements within normal limits will improve Outcome: Progressing Goal: Will remain free from infection Outcome: Progressing Goal: Diagnostic test results will improve Outcome: Progressing Goal: Respiratory complications will improve Outcome: Progressing Goal: Cardiovascular complication will be avoided Outcome: Progressing   Problem: Activity: Goal: Risk for activity intolerance will decrease Outcome: Progressing   Problem: Nutrition: Goal: Adequate nutrition will be maintained Outcome: Progressing   Problem: Coping: Goal: Level of anxiety will decrease Outcome: Progressing   Problem: Elimination: Goal: Will not experience complications related to bowel motility Outcome: Progressing Goal: Will not experience complications related to urinary retention Outcome: Progressing   Problem: Pain Managment: Goal: General experience of comfort will improve Outcome: Progressing   Problem: Safety: Goal: Ability to remain free from injury will improve Outcome: Progressing   Problem: Skin Integrity: Goal: Risk for impaired skin integrity will decrease Outcome: Progressing

## 2021-10-30 NOTE — Progress Notes (Signed)
HD#6 SUBJECTIVE:  Patient Summary: Roberto [name] is a 71 y.o. with a pertinent PMH of CVA in 2017, who presented with L sided numbness and admitted for R PCA infarct now complicated by MRSA and strep pyogenes bacteremia.  Overnight Events: Patient refused orthopantogram and MRI of his finger.  Interim History: Patient continues to endorse frustration at his current situation. He is bothered by how many blood tests he has gotten and by how often he is taken for more tests, which is why he declined going to have imaging done last night. He wants to get to rehab soon.  OBJECTIVE:  Vital Signs: Vitals:   10/29/21 2035 10/30/21 0058 10/30/21 0622 10/30/21 0900  BP: (!) 138/93 117/71 130/78 (!) 142/78  Pulse: 77 76 71 73  Resp: 19 17 17 16   Temp: 99.4 F (37.4 C) 99.5 F (37.5 C) 97.8 F (36.6 C) 98.2 F (36.8 C)  TempSrc: Oral Oral Oral Oral  SpO2: 98% 96% 97% 96%  Weight:      Height:       Supplemental O2: Room Air SpO2: 96 % O2 Flow Rate (L/min): 2 L/min  Filed Weights   10/23/21 1200 10/24/21 0316  Weight: 81.6 kg 77.1 kg     Intake/Output Summary (Last 24 hours) at 10/30/2021 1024 Last data filed at 10/30/2021 11/01/2021 Gross per 24 hour  Intake 779.55 ml  Output 1350 ml  Net -570.45 ml    Net IO Since Admission: -1,342.39 mL [10/30/21 1024]  Physical Exam: Constitutional:Resting comfortably in bed in no acute distress. Cardio:Regular rate and rhythm. No murmurs, rubs, gallops. Pulm:Normal work of breathing on room air. 11/01/21 for extremity edema. Skin:Skin is warm and dry. Site of blister noted on lateral R 5th finger earlier in admission is now blotchy and red without tenderness endorsed.  Neuro: Mental Status: Patient is awake, alert, oriented x3 No signs of aphasia.  Cranial Nerves: III,IV, VI: Does not track to L. Endorses continued diminished vision of L visual field. V: Facial sensation is symmetric to light touch and temperature. VII: Facial movement  is symmetric.  VIII: Hearing is intact to voice Motor: RUE and RLE 5/5, LUE 2/5 with slight movement, LLE 4/5. Sensory: Sensation is normal on RUE and RLE, diminished on LUE and LLE.   Patient Lines/Drains/Airways Status     Active Line/Drains/Airways     Name Placement date Placement time Site Days   Peripheral IV 10/23/21 20 G Anterior;Distal;Left;Upper Arm 10/23/21  --  Arm  5   External Urinary Catheter 10/26/21  --  --  2             ASSESSMENT/PLAN:  Assessment: Principal Problem:   CVA (cerebral vascular accident) (HCC) Active Problems:   Essential hypertension   Aspiration pneumonia (HCC)   Erythrocytosis   Hyponatremia   Acute kidney injury (HCC)   Plan: #MRSA and streptococcus pyogenes bacteremia Patient remains afebrile and hemodynamically stable. Leukocytosis has resolved. He has completed clindamycin x48h and remains on vancomycin. Further work-up to determine source of infection including orthopantogram and MRI R hand were refused overnight as patient is becoming increasingly frustrated with his current situation and how many tests are being asked of him, and TEE is scheduled for today at 1300. After our discussion this morning he is agreeable to having all studies done today.  -ID on board, appreciate their assistance  -MRI R hand  -Orthopantogram  -CT maxillofacial  -TEE -Continue vancomycin -F/u repeat blood culture -Trend CBC, fever curve  #  R PCA Infarct Neurological exam is stable with mild increase in strength and movement of RUE and RLE. He explains that the numbness of his L face comes and goes. No change in sensation to his extremities on the L. We discussed the importance of working hard with PT to show he is motivated so that our hopeful plan to discharge to CIR has a higher possibility of happening. I think that his acute stress reaction combined with feeling tired has limited in part his participation, but he endorses understanding of what  needs to be done in his therapy sessions. I don't feel strongly that he would do well in a SNF and believe CIR is his best bet for optimal recovery. -Neurology following, appreciate their assistance             -[facility] Stroke Study -SCDs only for VTE prophylaxis -Not a TNK candidate or thrombectomy candidate per most recent note -Will need to have loop recorder placed once medically stable -Continue atorvastatin 80 mg daily -Continue ASA 81 mg daily x 3 weeks -Continue Plavix 75 mg daily -Lifestyle modifications for pre-diabetes and hypertriglyceridemia   #Acute stress reaction Roberto [name] endorses having a difficult time coping with the significant changes in his health that he has faced over the last 1 week. He does not have a history of anxiety or depression, but does feel very depressed at this time. He denies SI. He states that he just wants to be done with all of the tests (he is getting tired of them), go home and take a hot bath in epsom salts. He really wants to go to rehab as soon as he can. His response is expected and understandable. -CTM. Will plan to recommend IBH referral on discharge for ongoing psychological support.   #Erythrocytosis, resolved Hgb stable at 16.3 today.  -F/u JAK2 -Trend CBC   #Hypertension Normotensive at this time, losartan still being held. -Restart losartan 50 mg daily if indicated.  #NAGMA Improving, bicarb 21 (20 06/15). Thought to be secondary to large volume diarrhea and/or sepsis with bacteremia in preceding days. -Trend BMP  Best Practice: Diet: Dysphagia 3 IVF: None VTE: Place and maintain sequential compression device Start: 10/26/21 1121 Code: Full AB: Vancomycin, ceftriaxone Therapy Recs: CIR Family Contact: [name] and [name] (partner), called and notified. DISPO: Anticipated discharge pending Medical stability and approval for CIR/insurance auth .  Signature: Champ Mungo, D.O.  Internal Medicine Resident, PGY-1 [facility]  Pager: [phone] 10:24 AM, 10/30/2021   Please contact the on call pager after 5 pm and on weekends at [phone].

## 2021-10-30 NOTE — Transfer of Care (Signed)
Immediate Anesthesia Transfer of Care Note  Patient: Roberto [name]  Procedure(s) Performed: TRANSESOPHAGEAL ECHOCARDIOGRAM (TEE) BUBBLE STUDY  Patient Location: Short Stay  Anesthesia Type:MAC  Level of Consciousness: drowsy  Airway & Oxygen Therapy: Patient Spontanous Breathing  Post-op Assessment: Report given to RN and Post -op Vital signs reviewed and stable  Post vital signs: Reviewed and stable  Last Vitals:  Vitals Value Taken Time  BP 145/60 10/30/21 1404  Temp    Pulse 76 10/30/21 1404  Resp 21 10/30/21 1404  SpO2 94 % 10/30/21 1404    Last Pain:  Vitals:   10/30/21 1244  TempSrc: Temporal  PainSc: 7          Complications: No notable events documented.

## 2021-10-30 NOTE — Progress Notes (Signed)
MRI called to take pt down, pt refused states that it is too early to go for testing. Provider Katsadouros notified.

## 2021-10-31 DIAGNOSIS — I63531 Cerebral infarction due to unspecified occlusion or stenosis of right posterior cerebral artery: Secondary | ICD-10-CM | POA: Diagnosis not present

## 2021-10-31 LAB — BASIC METABOLIC PANEL
Anion gap: 10 (ref 5–15)
BUN: 14 mg/dL (ref 8–23)
CO2: 18 mmol/L — ABNORMAL LOW (ref 22–32)
Calcium: 8.3 mg/dL — ABNORMAL LOW (ref 8.9–10.3)
Chloride: 102 mmol/L (ref 98–111)
Creatinine, Ser: 0.75 mg/dL (ref 0.61–1.24)
GFR, Estimated: >60 mL/min (ref >60)
Glucose, Bld: 100 mg/dL — ABNORMAL HIGH (ref 70–99)
Potassium: 3.9 mmol/L (ref 3.5–5.1)
Sodium: 130 mmol/L — ABNORMAL LOW (ref 135–145)

## 2021-10-31 LAB — CBC
HCT: 49.5 % (ref 39.0–52.0)
Hemoglobin: 17.6 g/dL — ABNORMAL HIGH (ref 13.0–17.0)
MCH: 32.2 pg (ref 26.0–34.0)
MCHC: 35.6 g/dL (ref 30.0–36.0)
MCV: 90.5 fL (ref 80.0–100.0)
Platelets: 175 10*3/uL (ref 150–400)
RBC: 5.47 MIL/uL (ref 4.22–5.81)
RDW: 12.5 % (ref 11.5–15.5)
WBC: 8.6 10*3/uL (ref 4.0–10.5)
nRBC: 0 % (ref 0.0–0.2)

## 2021-10-31 LAB — VANCOMYCIN, PEAK
Vancomycin Pk: 35 ug/mL (ref 30–40)
Vancomycin Pk: 31 ug/mL (ref 30–40)

## 2021-10-31 MED ORDER — LOSARTAN POTASSIUM 25 MG PO TABS
25.0000 mg | ORAL_TABLET | Freq: Every day | ORAL | Status: DC
  Administered 2021-10-31 – 2021-11-06 (×7): 25 mg via ORAL
  Filled 2021-10-31 (×7): qty 1

## 2021-10-31 MED ORDER — LIDOCAINE 5 % EX PTCH
1.0000 | MEDICATED_PATCH | CUTANEOUS | Status: DC
  Administered 2021-10-31 – 2021-11-05 (×6): 1 via TRANSDERMAL
  Filled 2021-10-31 (×6): qty 1

## 2021-10-31 NOTE — Progress Notes (Signed)
HD#7 SUBJECTIVE:  Patient Summary: Roberto [name] is a 71 y.o. with a pertinent PMH of CVA in 2017, who presented with L sided numbness and admitted for R PCA infarct now complicated by MRSA and strep pyogenes bacteremia.  Overnight Events: Patient refused orthopantogram and MRI of his finger.  Interim History: Patient seems less frustrated this morning but is still waking from sleep. He reports that his headache is better but does have some pain on his lower back where his belt line falls, mostly on the L.  OBJECTIVE:  Vital Signs: Vitals:   10/30/21 2050 10/31/21 0110 10/31/21 0502 10/31/21 0715  BP: (!) 153/95 (!) 142/79 (!) 149/88 (!) 158/91  Pulse: 89 72 79 85  Resp: 19 18 15 16   Temp: 99.1 F (37.3 C) 99.1 F (37.3 C) 98.6 F (37 C) 98.7 F (37.1 C)  TempSrc: Oral Oral Oral Oral  SpO2: 96% 95% 98% 96%  Weight:      Height:       Supplemental O2: Room Air SpO2: 96 % O2 Flow Rate (L/min): 2 L/min  Filed Weights   10/23/21 1200 10/24/21 0316  Weight: 81.6 kg 77.1 kg     Intake/Output Summary (Last 24 hours) at 10/31/2021 0848 Last data filed at 10/30/2021 1520 Gross per 24 hour  Intake 200 ml  Output 400 ml  Net -200 ml    Net IO Since Admission: -2,292.39 mL [10/31/21 0848]  Physical Exam: Constitutional:Resting comfortably in bed in no acute distress. Cardio:Regular rate and rhythm. No murmurs, rubs, gallops. Pulm:Normal work of breathing on room air. 11/02/21 for extremity edema. Skin:Skin is warm and dry. Lower back is without rash, increased warmth, erythema, edema. Neuro: Mental Status: Patient is awake, alert, oriented x3 No signs of aphasia Cranial Nerves: III,IV, VI: Does not track to L. Endorses continued diminished vision of L visual field. VIII: Hearing is intact to voice Motor: RUE and RLE 5/5, LUE 1-2/5 with slight movement, LLE 4/5. Sensory: Sensation is normal on RUE and RLE, diminished on LUE and LLE. Psych: Pleasant mood and  affect.  Patient Lines/Drains/Airways Status     Active Line/Drains/Airways     Name Placement date Placement time Site Days   Peripheral IV 10/23/21 20 G Anterior;Distal;Left;Upper Arm 10/23/21  --  Arm  5   External Urinary Catheter 10/26/21  --  --  2             ASSESSMENT/PLAN:  Assessment: Principal Problem:   CVA (cerebral vascular accident) (HCC) Active Problems:   Essential hypertension   Aspiration pneumonia (HCC)   Erythrocytosis   Hyponatremia   Acute kidney injury (HCC)   Bacteremia   Plan: #MRSA and streptococcus pyogenes bacteremia Afebrile, hemodynamically stable, without recurrence of white count. Repeat blood culture shows NGTD. TEE 06/16 did not reveal endocarditis. CT maxillofacial and MRI R hand have been cancelled. He has completed a course of clindamycin and remains on vancomycin. He will need a 2-week total treatment. -ID following, appreciate their assistance -Continue vancomycin -F/u repeat blood culture -Trend CBC, fever curve  #R PCA Infarct Neurological exam is stable. 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 From PT notes yesterday I see that he was very engaged in therapy and seems to have taken our conversation about working as hard as he could during these sessions to heart. I have encouraged him to continue with this same effort over the weekend and informed him that CIR admissions would be looking over his chart again  Monday. Again, I don't feel strongly that he would do well in a SNF and believe CIR is his best bet for optimal recovery. -Neurology following, appreciate their assistance             -[facility] Stroke Study -SCDs only for VTE prophylaxis -Continue atorvastatin 80 mg daily -Continue ASA 81 mg daily x 3 weeks -Continue Plavix 75 mg daily -Lifestyle modifications for pre-diabetes and hypertriglyceridemia   #Acute stress reaction Roberto [name] seems to be feeling somewhat better this morning and is happy to  know that we are not sending him for several more tests, which was one factor of many that was bothering him yesterday. I am hopeful that with continued encouragement and progress, he will start returning to baseline personality. -CTM. Will plan to recommend IBH referral on discharge for ongoing psychological support.   #Erythrocytosis Hgb increased today to 17.6, where it had been stable WNL over the last several days.  -F/u JAK2 -Trend CBC   #Hypertension BP readings have shown hypertension over the last 24h. He has not been taking home losartan for several days.  -Restart losartan at 25 mg daily  #NAGMA #Hyponatremia Stable with bicarb of 18 this morning. No clear source, though could be related to decreased PO intake as he is eating no more than 50% of his meals, less likely GI losses as he is no longer experiencing severe diarrhea. -Will check urine sodium, osm -Trend BMP  Best Practice: Diet: Dysphagia 3 IVF: None VTE: Place and maintain sequential compression device Start: 10/26/21 1121 Code: Full AB: Vancomycin, ceftriaxone Therapy Recs: CIR Family Contact: [name] and [name] (partner), called and notified. DISPO: Anticipated discharge pending Medical stability and approval for CIR/insurance auth .  Signature: Champ Mungo, D.O.  Internal Medicine Resident, PGY-1 [facility]  Pager: [phone] 8:48 AM, 10/31/2021   Please contact the on call pager after 5 pm and on weekends at [phone].

## 2021-10-31 NOTE — Progress Notes (Signed)
Pharmacy Antibiotic Note  Roberto [name] is a 71 y.o. male with hospital associated MRSA and Strep pyogenes bacteremia. Had poison ivy about a week ago which he has been scratching, could have introduced bacteremia through those wounds. Pharmacy has been consulted for Vancomycin dosing.  Renal function stable at Scr 0.85. D5 of vancomycin, will order levels around the next dose today  Plan: Continue Vancomycin 1500 mg IV q24h Monitor renal function, micro data, and clinical progression Vancomycin peak ordered for 1500 on 6/17 and trough at 11:30 prior to dose on 6/18  Height: 5\' 6"  (167.6 cm) Weight: 77.1 kg (169 lb 15.6 oz) IBW/kg (Calculated) : 63.8  Temp (24hrs), Avg:98.6 F (37 C), Min:98 F (36.7 C), Max:99.1 F (37.3 C)  Recent Labs  Lab 10/27/21 0241 10/27/21 0353 10/27/21 0609 10/28/21 0149 10/29/21 0231 10/30/21 0219 10/31/21 0243  WBC 27.2* 27.4*  --  11.1* 8.8 7.9 8.6  CREATININE 1.50*  --   --  1.04 0.85 0.85 0.75  LATICACIDVEN  --  1.7 1.2  --   --   --   --      Estimated Creatinine Clearance: 82.8 mL/min (by C-G formula based on SCr of 0.75 mg/dL).    No Known Allergies  Antimicrobials this admission:  Vancomycin 6/13>>  Clindamycin 6/13 >> 6/16  Cefepime x 1 on 6/13  Metronidazole x 1 on 6/13   Unasyn 6/13 x1  Dose adjustments this admission:  n/a  Microbiology results:  6/13 Bcx:GAS, MRSA 6/14 Bcx: NGTD  Thank you for allowing pharmacy to be a part of this patient's care.  7/14, PharmD PGY2 Infectious Diseases Pharmacy Resident   Please check [email or web address] for unit-specific pharmacy phone numbers

## 2021-10-31 NOTE — Progress Notes (Signed)
STROKE TEAM PROGRESS NOTE   SUBJECTIVE (INTERVAL HISTORY) His wife is at the bedside.  Patient no acute event overnight, neuro stable, unchanged.  TEE yesterday showed no endocarditis.  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  Neuro exam is unchanged.  Vital signs stable.  Medically stable for transfer to rehab when bed available  OBJECTIVE Temp:  [98.2 F (36.8 C)-99.1 F (37.3 C)] 98.6 F (37 C) (06/17 1145) Pulse Rate:  [69-89] 83 (06/17 1145) Cardiac Rhythm: Normal sinus rhythm (06/17 0734) Resp:  [14-23] 21 (06/17 1145) BP: (118-158)/(60-95) 144/90 (06/17 1145) SpO2:  [94 %-98 %] 96 % (06/17 1145)  Recent Labs  Lab 10/26/21 1922  GLUCAP 139*   Recent Labs  Lab 10/27/21 0241 10/28/21 0149 10/29/21 0231 10/30/21 0219 10/31/21 0243  NA 131* 129* 129* 133* 130*  K 4.1 3.6 3.9 3.5 3.9  CL 98 104 100 103 102  CO2 19* 17* 20* 21* 18*  GLUCOSE 112* 120* 108* 126* 100*  BUN 26* 28* 24* 18 14  CREATININE 1.50* 1.04 0.85 0.85 0.75  CALCIUM 8.9 8.2* 8.0* 8.2* 8.3*   Recent Labs  Lab 10/27/21 0353  AST 23  ALT 24  ALKPHOS 63  BILITOT 1.4*  PROT 7.4  ALBUMIN 3.2*   Recent Labs  Lab 10/27/21 0353 10/28/21 0149 10/29/21 0231 10/30/21 0219 10/31/21 0243  WBC 27.4* 11.1* 8.8 7.9 8.6  NEUTROABS 23.9*  --   --   --   --   HGB 18.8* 16.8 16.3 16.4 17.6*  HCT 53.8* 48.4 46.3 46.8 49.5  MCV 92.1 92.7 91.7 91.2 90.5  PLT 202 162 151 164 175   No results for input(s): "CKTOTAL", "CKMB", "CKMBINDEX", "TROPONINI" in the last 168 hours. No results for input(s): "LABPROT", "INR" in the last 72 hours. No results for input(s): "COLORURINE", "LABSPEC", "PHURINE", "GLUCOSEU", "HGBUR", "BILIRUBINUR", "KETONESUR", "PROTEINUR", "UROBILINOGEN", "NITRITE", "LEUKOCYTESUR" in the last 72 hours.  Invalid input(s): "APPERANCEUR"     Component Value Date/Time   CHOL 173 10/23/2021 1247   TRIG 272 (H) 10/23/2021 1247   HDL 67 10/23/2021 1247   CHOLHDL 2.6 10/23/2021 1247    VLDL 54 (H) 10/23/2021 1247   LDLCALC 52 10/23/2021 1247   Lab Results  Component Value Date   HGBA1C 5.8 (H) 10/23/2021      Component Value Date/Time   LABOPIA NONE DETECTED 12/09/2016 0900   COCAINSCRNUR NONE DETECTED 12/09/2016 0900   LABBENZ NONE DETECTED 12/09/2016 0900   AMPHETMU NONE DETECTED 12/09/2016 0900   THCU NONE DETECTED 12/09/2016 0900   LABBARB NONE DETECTED 12/09/2016 0900    No results for input(s): "ETH" in the last 168 hours.  I have personally reviewed the radiological images below and agree with the radiology interpretations.  VAS [place] UPPER EXTREMITY VENOUS DUPLEX  Result Date: 10/31/2021 UPPER VENOUS STUDY  Patient Name:  KAPLAN VENEGAS  Date of Exam:   10/30/2021 Medical Rec #: [id number]    Accession #:    [id number] Date of Birth: 1951-01-03    Patient Gender: M Patient Age:   71 years Exam Location:  [facility] Procedure:      VAS [place] UPPER EXTREMITY VENOUS DUPLEX Referring Phys: Burna Cash [facility] --------------------------------------------------------------------------------  Indications: Swelling & redness Comparison Study: No previous exams Performing Technologist: Jody Hill RVT, RDMS  Examination Guidelines: A complete evaluation includes B-mode imaging, spectral Doppler, color Doppler, and power Doppler as needed of all accessible portions of each vessel. Bilateral testing is considered an integral part of a complete  examination. Limited examinations for reoccurring indications may be performed as noted.  Right Findings: +----------+------------+---------+-----------+----------+--------------+ RIGHT     CompressiblePhasicitySpontaneousProperties   Summary     +----------+------------+---------+-----------+----------+--------------+ IJV           Full       Yes       Yes                             +----------+------------+---------+-----------+----------+--------------+ Subclavian    Full       Yes       Yes                              +----------+------------+---------+-----------+----------+--------------+ Axillary      Full       Yes       Yes                             +----------+------------+---------+-----------+----------+--------------+ Brachial      Full       Yes       Yes                             +----------+------------+---------+-----------+----------+--------------+ Radial        Full                                                 +----------+------------+---------+-----------+----------+--------------+ Ulnar         Full                                                 +----------+------------+---------+-----------+----------+--------------+ Cephalic                                            Not visualized +----------+------------+---------+-----------+----------+--------------+ Basilic       None       No        No                   Acute      +----------+------------+---------+-----------+----------+--------------+  Left Findings: +----------+------------+---------+-----------+----------+-------+ LEFT      CompressiblePhasicitySpontaneousPropertiesSummary +----------+------------+---------+-----------+----------+-------+ Subclavian    Full       Yes       Yes                      +----------+------------+---------+-----------+----------+-------+  Summary:  Right: No evidence of deep vein thrombosis in the upper extremity. Findings consistent with acute superficial vein thrombosis involving the right basilic vein.  Left: No evidence of thrombosis in the subclavian.  *See table(s) above for measurements and observations.  Diagnosing physician: Jamelle Haring Electronically signed by Jamelle Haring on 10/31/2021 at 12:14:40 PM.    Final    ECHO TEE  Result Date: 10/30/2021    TRANSESOPHOGEAL ECHO REPORT   Patient Name:   ROMON CORSINO Date of Exam: 10/30/2021 Medical Rec #:  [id number]   Height:  66.0 in Accession #:    [id number]  Weight:       170.0 lb Date of Birth:   24-Feb-1951   BSA:          1.866 m Patient Age:    71 years    BP:           126/79 mmHg Patient Gender: M           HR:           95 bpm. Exam Location:  Inpatient Procedure: Transesophageal Echo, Cardiac Doppler, Color Doppler and Saline            Contrast Bubble Study Indications:     Bacteremia                  CVA  History:         Patient has prior history of Echocardiogram examinations, most                  recent 10/24/2021. Risk Factors:Hypertension. Bacteremia.  Sonographer:     Clayton Lefort RDCS (AE) Referring Phys:  [id number] Rosalene Billings ROBERTS Diagnosing Phys: Buford Dresser MD PROCEDURE: After discussion of the risks and benefits of a TEE, an informed consent was obtained from the patient. The transesophogeal probe was passed without difficulty through the esophogus of the patient. Sedation performed by different physician. The patient was monitored while under deep sedation. Anesthestetic sedation was provided intravenously by Anesthesiology: 290.17mg  of Propofol. Image quality was good. The patient developed no complications during the procedure. IMPRESSIONS  1. Left ventricular ejection fraction, by estimation, is 60 to 65%. The left ventricle has normal function.  2. Right ventricular systolic function is normal. The right ventricular size is normal.  3. No left atrial/left atrial appendage thrombus was detected.  4. The mitral valve is normal in structure. Trivial mitral valve regurgitation. No evidence of mitral stenosis.  5. The aortic valve is tricuspid. Aortic valve regurgitation is not visualized. No aortic stenosis is present.  6. There is Moderate (Grade III) plaque involving the descending aorta.  7. Evidence of atrial level shunting detected by color flow Doppler. Agitated saline contrast bubble study was positive with shunting observed within 3-6 cardiac cycles suggestive of interatrial shunt. Conclusion(s)/Recommendation(s): No evidence of vegetation/infective endocarditis on this  transesophageael echocardiogram. 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 FINDINGS  Left Ventricle: Left ventricular ejection fraction, by estimation, is 60 to 65%. The left ventricle has normal function. The left ventricular internal cavity size was normal in size. Right Ventricle: The right ventricular size is normal. No increase in right ventricular wall thickness. Right ventricular systolic function is normal. Left Atrium: Left atrial size was normal in size. No left atrial/left atrial appendage thrombus was detected. Right Atrium: Right atrial size was normal in size. Prominent Eustachian valve. Pericardium: There is no evidence of pericardial effusion. Mitral Valve: The mitral valve is normal in structure. Trivial mitral valve regurgitation. No evidence of mitral valve stenosis. There is no evidence of mitral valve vegetation. Tricuspid Valve: The tricuspid valve is normal in structure. Tricuspid valve regurgitation is not demonstrated. No evidence of tricuspid stenosis. There is no evidence of tricuspid valve vegetation. Aortic Valve: The aortic valve is tricuspid. Aortic valve regurgitation is not visualized. No aortic stenosis is present. There is no evidence of aortic valve vegetation. Pulmonic Valve: The pulmonic valve was grossly normal. Pulmonic valve regurgitation is not visualized. No evidence of pulmonic stenosis. There is no  evidence of pulmonic valve vegetation. Aorta: The aortic root and ascending aorta are structurally normal, with no evidence of dilitation. There is moderate (Grade III) plaque involving the descending aorta. IAS/Shunts: Evidence of atrial level shunting detected by color flow Doppler. Agitated saline contrast was given intravenously to evaluate for intracardiac shunting. Agitated saline contrast bubble study was positive with shunting observed within 3-6 cardiac cycles suggestive of interatrial shunt. Buford Dresser MD Electronically signed by  Buford Dresser MD Signature Date/Time: 10/30/2021/4:58:49 PM    Final    MR BRAIN WO CONTRAST  Result Date: 10/28/2021 CLINICAL DATA:  Follow-up examination for stroke. EXAM: MRI HEAD WITHOUT CONTRAST TECHNIQUE: Multiplanar, multiecho pulse sequences of the brain and surrounding structures were obtained without intravenous contrast. COMPARISON:  Comparison made with previous MRI from 10/23/2021. FINDINGS: Brain: Examination moderately degraded by motion artifact. There has been interval worsening and expansion of previously identified right PCA distribution infarct since previous MRI, now fairly large in size and involving the majority of the right PCA distribution.Minimal associated petechial blood products without frank hemorrhagic transformation (series 7, image 64) Heidelberg classification 1a: HI1, scattered small petechiae, no mass effect. No other definite acute or subacute infarct identified. No made of an apparent punctate focus of diffusion abnormality involving the left ventral pons on axial image 13, not convincingly seen on corresponding sequences, and favored to be artifactual. Gray-white matter differentiation otherwise maintained. No other acute or chronic intracranial blood products. Underlying age-related cerebral atrophy with mild chronic small vessel ischemic disease again noted. No mass lesion or midline shift. No extra-axial fluid collection. No hydrocephalus. Pituitary gland suprasellar region grossly within normal limits. Vascular: Major intracranial vascular flow voids are maintained at the skull base. Skull and upper cervical spine: Craniocervical junction with normal limits. Bone marrow signal intensity grossly within normal limits. No scalp soft tissue abnormality. Sinuses/Orbits: Globes orbital soft tissues demonstrate no acute finding. Paranasal sinuses are largely clear. No significant mastoid effusion. Other: None. IMPRESSION: 1. Motion degraded exam. 2. Interval worsening  and expansion of previously identified right PCA distribution infarct, now fairly large in and involving the majority of the right PCA distribution. Minimal associated petechial blood products without frank hemorrhagic transformation. 3. No other new acute intracranial abnormality. Electronically Signed   By: Jeannine Boga M.D.   On: 10/28/2021 03:47   DG CHEST PORT 1 VIEW  Result Date: 10/27/2021 CLINICAL DATA:  [id number] shortness of breath and hypoxia EXAM: PORTABLE CHEST 1 VIEW COMPARISON:  Chest x-ray from yesterday FINDINGS: The heart size and mediastinal contours are within normal limits. Stable atheromatous calcifications at the arch of the aorta low lung volumes. Minimal atelectasis at the left lung base. No focal consolidation, pleural effusion or vascular congestion. The visualized skeletal structures are unremarkable. IMPRESSION: Minimal atelectasis at the left lung base. Low lung volumes. There has been no significant interval change. Electronically Signed   By: Frazier Richards M.D.   On: 10/27/2021 11:19   DG CHEST PORT 1 VIEW  Result Date: 10/26/2021 CLINICAL DATA:  Altered mental status. EXAM: PORTABLE CHEST 1 VIEW COMPARISON:  Chest x-ray 12/08/2016. FINDINGS: The heart size and mediastinal contours are within normal limits. There are atherosclerotic calcifications of the aorta. Both lungs are clear. The visualized skeletal structures are unremarkable. IMPRESSION: No active disease. Electronically Signed   By: Ronney Asters M.D.   On: 10/26/2021 23:45   CT ANGIO HEAD CODE STROKE  Result Date: 10/26/2021 CLINICAL DATA:  Initial evaluation for neuro deficit, stroke suspected. EXAM: CT  ANGIOGRAPHY HEAD AND NECK TECHNIQUE: Multidetector CT imaging of the head and neck was performed using the standard protocol during bolus administration of intravenous contrast. Multiplanar CT image reconstructions and MIPs were obtained to evaluate the vascular anatomy. Carotid stenosis measurements (when  applicable) are obtained utilizing NASCET criteria, using the distal internal carotid diameter as the denominator. RADIATION DOSE REDUCTION: This exam was performed according to the departmental dose-optimization program which includes automated exposure control, adjustment of the mA and/or kV according to patient size and/or use of iterative reconstruction technique. CONTRAST:  77mL OMNIPAQUE IOHEXOL 350 MG/ML SOLN COMPARISON:  Prior CT from earlier the same day as well as earlier studies. FINDINGS: CTA NECK FINDINGS Aortic arch: Visualized aortic arch normal in caliber with standard branching pattern. Moderate aortic atherosclerosis. No significant stenosis about the origin the great vessels. Right carotid system: Right common and internal carotid arteries patent without significant stenosis or dissection. Eccentric atheromatous plaque about the right carotid bulb without significant stenosis. Left carotid system: Left CCA patent without stenosis. Predominant soft plaque at the left carotid bulb/proximal left ICA without hemodynamically significant stenosis. Left ICA patent distally without stenosis or dissection. Vertebral arteries: Both vertebral arteries arise from the subclavian arteries. No proximal subclavian artery stenosis. Both vertebral arteries widely patent without stenosis, dissection or occlusion. Skeleton: No discrete or worrisome osseous lesions. Advanced degenerative spondylosis present at C4-5 through C6-7. Grade 1 facet mediated anterolisthesis of C7 on T1. Poor dentition noted. Other neck: No other acute soft tissue abnormality within the neck. Upper chest: Visualized upper chest demonstrates no acute finding. Review of the MIP images confirms the above findings CTA HEAD FINDINGS Anterior circulation: Petrous segments patent bilaterally. Scattered atheromatous change within the carotid siphons without hemodynamically significant stenosis. A1 segments, anterior communicating artery complex  common anterior cerebral arteries patent without stenosis. No M1 stenosis or occlusion. No proximal MCA branch occlusion. Distal MCA branches perfused and symmetric. Posterior circulation: Both vertebral arteries patent without stenosis. Both PICA patent at their origins. Basilar patent to its distal aspect without stenosis. Superior cerebellar arteries patent bilaterally. Left PCA supplied via a hypoplastic left P1 segment and robust left posterior communicating artery. Focal moderate proximal left P2 stenosis noted (series 12, image 21). Left PCA otherwise patent to its distal aspect. Right PCA appears to largely occluded at the origin of the right P2 segment. Finding is in keeping with the evolving early subacute right PCA distribution infarct. Venous sinuses: Grossly patent allowing for timing the contrast bolus. Anatomic variants: None significant. Review of the MIP images confirms the above findings IMPRESSION: 1. Occlusion of the proximal right P2 segment, in keeping with the evolving early subacute right PCA distribution infarct. 2. No other emergent large vessel occlusion. 3. Moderate proximal left P2 stenosis. 4. Mild-to-moderate atheromatous change about the carotid bifurcations and carotid siphons. No other hemodynamically significant or correctable stenosis. 5. Aortic Atherosclerosis (ICD10-I70.0). These results were communicated to Dr. Otelia Limes at 8:36 pm on 10/26/2021 by text page via the [facility] messaging system. Electronically Signed   By: Rise Mu M.D.   On: 10/26/2021 20:38   CT ANGIO NECK CODE STROKE  Result Date: 10/26/2021 CLINICAL DATA:  Initial evaluation for neuro deficit, stroke suspected. EXAM: CT ANGIOGRAPHY HEAD AND NECK TECHNIQUE: Multidetector CT imaging of the head and neck was performed using the standard protocol during bolus administration of intravenous contrast. Multiplanar CT image reconstructions and MIPs were obtained to evaluate the vascular anatomy. Carotid  stenosis measurements (when applicable) are obtained utilizing NASCET  criteria, using the distal internal carotid diameter as the denominator. RADIATION DOSE REDUCTION: This exam was performed according to the departmental dose-optimization program which includes automated exposure control, adjustment of the mA and/or kV according to patient size and/or use of iterative reconstruction technique. CONTRAST:  14mL OMNIPAQUE IOHEXOL 350 MG/ML SOLN COMPARISON:  Prior CT from earlier the same day as well as earlier studies. FINDINGS: CTA NECK FINDINGS Aortic arch: Visualized aortic arch normal in caliber with standard branching pattern. Moderate aortic atherosclerosis. No significant stenosis about the origin the great vessels. Right carotid system: Right common and internal carotid arteries patent without significant stenosis or dissection. Eccentric atheromatous plaque about the right carotid bulb without significant stenosis. Left carotid system: Left CCA patent without stenosis. Predominant soft plaque at the left carotid bulb/proximal left ICA without hemodynamically significant stenosis. Left ICA patent distally without stenosis or dissection. Vertebral arteries: Both vertebral arteries arise from the subclavian arteries. No proximal subclavian artery stenosis. Both vertebral arteries widely patent without stenosis, dissection or occlusion. Skeleton: No discrete or worrisome osseous lesions. Advanced degenerative spondylosis present at C4-5 through C6-7. Grade 1 facet mediated anterolisthesis of C7 on T1. Poor dentition noted. Other neck: No other acute soft tissue abnormality within the neck. Upper chest: Visualized upper chest demonstrates no acute finding. Review of the MIP images confirms the above findings CTA HEAD FINDINGS Anterior circulation: Petrous segments patent bilaterally. Scattered atheromatous change within the carotid siphons without hemodynamically significant stenosis. A1 segments, anterior  communicating artery complex common anterior cerebral arteries patent without stenosis. No M1 stenosis or occlusion. No proximal MCA branch occlusion. Distal MCA branches perfused and symmetric. Posterior circulation: Both vertebral arteries patent without stenosis. Both PICA patent at their origins. Basilar patent to its distal aspect without stenosis. Superior cerebellar arteries patent bilaterally. Left PCA supplied via a hypoplastic left P1 segment and robust left posterior communicating artery. Focal moderate proximal left P2 stenosis noted (series 12, image 21). Left PCA otherwise patent to its distal aspect. Right PCA appears to largely occluded at the origin of the right P2 segment. Finding is in keeping with the evolving early subacute right PCA distribution infarct. Venous sinuses: Grossly patent allowing for timing the contrast bolus. Anatomic variants: None significant. Review of the MIP images confirms the above findings IMPRESSION: 1. Occlusion of the proximal right P2 segment, in keeping with the evolving early subacute right PCA distribution infarct. 2. No other emergent large vessel occlusion. 3. Moderate proximal left P2 stenosis. 4. Mild-to-moderate atheromatous change about the carotid bifurcations and carotid siphons. No other hemodynamically significant or correctable stenosis. 5. Aortic Atherosclerosis (ICD10-I70.0). These results were communicated to Dr. Cheral Marker at 8:36 pm on 10/26/2021 by text page via the [facility] messaging system. Electronically Signed   By: Jeannine Boga M.D.   On: 10/26/2021 20:38   CT HEAD CODE STROKE WO CONTRAST`  Result Date: 10/26/2021 CLINICAL DATA:  Code stroke. EXAM: CT HEAD WITHOUT CONTRAST TECHNIQUE: Contiguous axial images were obtained from the base of the skull through the vertex without intravenous contrast. RADIATION DOSE REDUCTION: This exam was performed according to the departmental dose-optimization program which includes automated exposure  control, adjustment of the mA and/or kV according to patient size and/or use of iterative reconstruction technique. COMPARISON:  Prior study from 10/24/2021. FINDINGS: Brain: Evolving cytotoxic edema involving the mesial right temporal occipital region and right thalamus, compatible with early subacute right PCA distribution infarct, increased in size as compared to previous. No associated hemorrhage or significant regional mass effect.  No other new large vessel territory infarct. No other acute intracranial hemorrhage. No mass lesion or midline shift. No hydrocephalus or extra-axial fluid collection. Vascular: No hyperdense vessel. Skull: Scalp soft tissues and calvarium demonstrate no acute finding. Sinuses/Orbits: /globes and orbital soft tissues within normal limits. Paranasal sinuses and mastoid air cells are clear. Other: None. ASPECTS [facility] Stroke Program Early CT Score) - Ganglionic level infarction (caudate, lentiform nuclei, internal capsule, insula, M1-M3 cortex): 7 - Supraganglionic infarction (M4-M6 cortex): 3 Total score (0-10 with 10 being normal): 10 IMPRESSION: 1. Increased size of evolving early subacute right PCA distribution infarct. No evidence for hemorrhagic transformation or significant regional mass effect. 2. No other new acute intracranial abnormality. 3. ASPECTS is 10. These results were communicated to Dr. Cheral Marker at 8:01 pm on 10/26/2021 by text page via the [facility] messaging system. Electronically Signed   By: Jeannine Boga M.D.   On: 10/26/2021 20:04   ECHOCARDIOGRAM COMPLETE  Result Date: 10/24/2021    ECHOCARDIOGRAM REPORT   Patient Name:   INGRID YANIK Date of Exam: 10/24/2021 Medical Rec #:  [id number]   Height:       66.0 in Accession #:    [id number]  Weight:       170.0 lb Date of Birth:  January 28, 1951   BSA:          1.866 m Patient Age:    75 years    BP:           146/96 mmHg Patient Gender: M           HR:           73 bpm. Exam Location:  Inpatient Procedure: 2D Echo,  Cardiac Doppler and Color Doppler Indications:    Stroke  History:        Patient has prior history of Echocardiogram examinations, most                 recent 12/14/2016. Stroke.  Sonographer:    Merrie Roof RDCS Referring Phys: [phone] [place]  1. Left ventricular ejection fraction, by estimation, is 60 to 65%. The left ventricle has normal function. The left ventricle has no regional wall motion abnormalities. Left ventricular diastolic parameters are consistent with Grade I diastolic dysfunction (impaired relaxation).  2. Right ventricular systolic function is normal. The right ventricular size is normal.  3. The mitral valve is normal in structure. No evidence of mitral valve regurgitation. No evidence of mitral stenosis.  4. The aortic valve is tricuspid. Aortic valve regurgitation is not visualized. Aortic valve sclerosis is present, with no evidence of aortic valve stenosis.  5. The inferior vena cava is normal in size with greater than 50% respiratory variability, suggesting right atrial pressure of 3 mmHg. FINDINGS  Left Ventricle: Left ventricular ejection fraction, by estimation, is 60 to 65%. The left ventricle has normal function. The left ventricle has no regional wall motion abnormalities. The left ventricular internal cavity size was normal in size. There is  no left ventricular hypertrophy. Left ventricular diastolic parameters are consistent with Grade I diastolic dysfunction (impaired relaxation). Right Ventricle: The right ventricular size is normal. Right ventricular systolic function is normal. Left Atrium: Left atrial size was normal in size. Right Atrium: Right atrial size was normal in size. Pericardium: There is no evidence of pericardial effusion. Mitral Valve: The mitral valve is normal in structure. No evidence of mitral valve regurgitation. No evidence of mitral valve stenosis. Tricuspid Valve: The tricuspid valve is normal  in structure. Tricuspid valve regurgitation  is not demonstrated. No evidence of tricuspid stenosis. Aortic Valve: The aortic valve is tricuspid. Aortic valve regurgitation is not visualized. Aortic valve sclerosis is present, with no evidence of aortic valve stenosis. Aortic valve mean gradient measures 1.0 mmHg. Aortic valve peak gradient measures 2.8  mmHg. Aortic valve area, by VTI measures 2.65 cm. Pulmonic Valve: The pulmonic valve was normal in structure. Pulmonic valve regurgitation is not visualized. No evidence of pulmonic stenosis. Aorta: The aortic root is normal in size and structure. Venous: The inferior vena cava is normal in size with greater than 50% respiratory variability, suggesting right atrial pressure of 3 mmHg. IAS/Shunts: No atrial level shunt detected by color flow Doppler.  LEFT VENTRICLE PLAX 2D LVIDd:         3.60 cm   Diastology LVIDs:         2.60 cm   LV e' medial:    5.55 cm/s LV PW:         1.10 cm   LV E/e' medial:  6.3 LV IVS:        1.20 cm   LV e' lateral:   6.74 cm/s LVOT diam:     2.00 cm   LV E/e' lateral: 5.2 LV SV:         42 LV SV Index:   23 LVOT Area:     3.14 cm  RIGHT VENTRICLE RV Basal diam:  3.50 cm RV S prime:     11.50 cm/s TAPSE (M-mode): 2.1 cm LEFT ATRIUM             Index        RIGHT ATRIUM           Index LA diam:        3.70 cm 1.98 cm/m   RA Area:     21.30 cm LA Vol (A2C):   59.2 ml 31.72 ml/m  RA Volume:   64.10 ml  34.35 ml/m LA Vol (A4C):   42.3 ml 22.67 ml/m LA Biplane Vol: 50.7 ml 27.17 ml/m  AORTIC VALVE AV Area (Vmax):    2.64 cm AV Area (Vmean):   2.62 cm AV Area (VTI):     2.65 cm AV Vmax:           83.40 cm/s AV Vmean:          56.500 cm/s AV VTI:            0.159 m AV Peak Grad:      2.8 mmHg AV Mean Grad:      1.0 mmHg LVOT Vmax:         70.20 cm/s LVOT Vmean:        47.200 cm/s LVOT VTI:          0.134 m LVOT/AV VTI ratio: 0.84  AORTA Ao Root diam: 3.60 cm Ao Asc diam:  3.50 cm MITRAL VALVE MV Area (PHT): 3.91 cm    SHUNTS MV Decel Time: 194 msec    Systemic VTI:  0.13 m MV E  velocity: 35.10 cm/s  Systemic Diam: 2.00 cm MV A velocity: 88.00 cm/s MV E/A ratio:  0.40 Kirk Ruths MD Electronically signed by Kirk Ruths MD Signature Date/Time: 10/24/2021/5:14:34 PM    Final    CT HEAD WO CONTRAST (5MM)  Result Date: 10/24/2021 CLINICAL DATA:  Neuro deficit with acute stroke suspected. Worsening symptoms EXAM: CT HEAD WITHOUT CONTRAST TECHNIQUE: Contiguous axial images were obtained from the base of the  skull through the vertex without intravenous contrast. RADIATION DOSE REDUCTION: This exam was performed according to the departmental dose-optimization program which includes automated exposure control, adjustment of the mA and/or kV according to patient size and/or use of iterative reconstruction technique. COMPARISON:  Brain MRI from yesterday FINDINGS: Brain: Known acute infarction in the right PCA distribution affecting thalamus and occipital cortex. No hemorrhagic conversion or new territory of involvement seen. There may be more confluent right occipital inferior cortex involvement when compared to the preceding brain MRI. No hemorrhage, hydrocephalus, or masslike finding Vascular: No hyperdense vessel or unexpected calcification. Skull: Normal. Negative for fracture or focal lesion. Sinuses/Orbits: No acute finding. IMPRESSION: Known acute infarction in the right PCA distribution. There may be more confluent involvement in the right occipital cortex than on the preceding brain MRI. No hemorrhagic conversion or new territory distribution. Electronically Signed   By: Tiburcio Pea M.D.   On: 10/24/2021 09:55   MR BRAIN WO CONTRAST  Result Date: 10/23/2021 CLINICAL DATA:  Acute neurologic deficit EXAM: MRI HEAD WITHOUT CONTRAST TECHNIQUE: Multiplanar, multiecho pulse sequences of the brain and surrounding structures were obtained without intravenous contrast. COMPARISON:  12/09/2016 FINDINGS: Brain: There are areas of abnormal diffusion restriction within the right PCA  territory, involving the dorsal thalamus, medial right temporal lobe and the right occipital lobe. No acute or chronic hemorrhage. There is multifocal hyperintense T2-weighted signal within the white matter. Generalized cerebral volume loss. The midline structures are normal. Vascular: Major flow voids are preserved. Skull and upper cervical spine: Normal calvarium and skull base. Visualized upper cervical spine and soft tissues are normal. Sinuses/Orbits:No paranasal sinus fluid levels or advanced mucosal thickening. No mastoid or middle ear effusion. Normal orbits. IMPRESSION: Multifocal acute ischemia within the right PCA territory, involving the dorsal thalamus, medial right temporal lobe and the right occipital lobe. No hemorrhage or mass effect. Electronically Signed   By: Deatra Robinson M.D.   On: 10/23/2021 19:49   CT ANGIO HEAD NECK W WO CM W PERF (CODE STROKE)  Result Date: 10/23/2021 CLINICAL DATA:  Stroke suspected EXAM: CT ANGIOGRAPHY HEAD AND NECK CT PERFUSION BRAIN TECHNIQUE: Multidetector CT imaging of the head and neck was performed using the standard protocol during bolus administration of intravenous contrast. Multiplanar CT image reconstructions and MIPs were obtained to evaluate the vascular anatomy. Carotid stenosis measurements (when applicable) are obtained utilizing NASCET criteria, using the distal internal carotid diameter as the denominator. Multiphase CT imaging of the brain was performed following IV bolus contrast injection. Subsequent parametric perfusion maps were calculated using RAPID software. RADIATION DOSE REDUCTION: This exam was performed according to the departmental dose-optimization program which includes automated exposure control, adjustment of the mA and/or kV according to patient size and/or use of iterative reconstruction technique. CONTRAST:  OMNIPAQUE IOHEXOL 350 MG/ML SOLN COMPARISON:  No prior CTA, correlation is made with 12/09/2016 MRA head and 10/23/2021  CT head FINDINGS: CT HEAD FINDINGS For noncontrast findings, please see same day CT head. CTA NECK FINDINGS Evaluation is limited by bolus timing and significant contrast in the venous system. Aortic arch: Standard branching. Imaged portion shows no evidence of aneurysm or dissection. No significant stenosis of the major arch vessel origins. Aortic atherosclerosis Right carotid system: No evidence of dissection, occlusion, or hemodynamically significant stenosis (greater than 50%). Atherosclerotic disease at the bifurcation and in the proximal ICA is not hemodynamically significant. Left carotid system: Approximally 75% luminal narrowing of the proximal left ICA secondary to primarily noncalcified plaque no  evidence of dissection or occlusion. Vertebral arteries: No evidence of dissection, occlusion, or hemodynamically significant stenosis (greater than 50%). Skeleton: Degenerative changes in the cervical spine with significant disc height loss. Poor dentition with multifocal periapical lucency and dental caries. Other neck: No acute finding. Upper chest: No focal pulmonary opacity or pleural effusion in the imaged lungs. Review of the MIP images confirms the above findings CTA HEAD FINDINGS Evaluation is limited by bolus timing and degree of venous contamination. Anterior circulation: Both internal carotid arteries are patent to the termini, with mild narrowing in the bilateral cavernous carotids. A1 segments patent. Normal anterior communicating artery. Evaluation of the more distal anterior cerebral arteries is limited by poor opacification but appears grossly patent to the distal aspects. No M1 stenosis or occlusion. Normal MCA bifurcations. Evaluation of the more distal MCAs is limited by poor opacification, but they appear grossly patent to their distal aspects. Posterior circulation: Vertebral arteries patent to the vertebrobasilar junction without stenosis. Basilar patent to its distal aspect. Superior  cerebellar arteries are not well opacified. Patent P1 segments. PCAs perfused proximally but are poorly opacified past the anterior P2 segments and evaluation is significantly complicated by venous contamination. The bilateral posterior communicating arteries are not definitively visualized. Venous sinuses: As permitted by contrast timing, patent. Anatomic variants: None significant. Review of the MIP images confirms the above findings CT Brain Perfusion Findings: ASPECTS: 10 CBF (<30%) Volume: 25mL Perfusion (Tmax>6.0s) volume: Mismatch Volume: Infarction Location:No infarct core. Decreased perfusion is noted in the bilateral cerebral and cerebellar hemispheres, not conforming to any particular vascular territory but possibly involving the bilateral watershed areas. Some or all of this may also be secondary to motion. IMPRESSION: 1. Evaluation is limited by poor bolus timing and venous contamination. Within this limitation, there is approximately 75% stenosis in the proximal left ICA. No other hemodynamically significant stenosis in the neck. 2. Evaluation of the distal intracranial vasculature is severely limited, as described above. The proximal intracranial vasculature appears patent. 3. On CT perfusion, no infarct core is detected, but there is decreased perfusion in the bilateral cerebral and cerebellar hemispheres, some of which may be in a watershed distribution; however, some or all be artifactual. Imaging results were communicated on 10/23/2021 at 1:48 pm to provider Dr. Pearlean Brownie via secure text paging. Electronically Signed   By: Wiliam Ke M.D.   On: 10/23/2021 13:49   CT HEAD CODE STROKE WO CONTRAST  Result Date: 10/23/2021 CLINICAL DATA:  Code stroke.  Stroke suspected EXAM: CT HEAD WITHOUT CONTRAST TECHNIQUE: Contiguous axial images were obtained from the base of the skull through the vertex without intravenous contrast. RADIATION DOSE REDUCTION: This exam was performed according to the  departmental dose-optimization program which includes automated exposure control, adjustment of the mA and/or kV according to patient size and/or use of iterative reconstruction technique. COMPARISON:  12/08/2016 FINDINGS: Brain: No evidence of acute infarction, hemorrhage, cerebral edema, mass, mass effect, or midline shift. Ventricles and sulci are normal for age. No extra-axial fluid collection. Vascular: No hyperdense vessel. Skull: Negative for fracture or focal lesion. Sinuses/Orbits: No acute finding. Other: The mastoid air cells are well aerated. ASPECTS [facility] Stroke Program Early CT Score) - Ganglionic level infarction (caudate, lentiform nuclei, internal capsule, insula, M1-M3 cortex): 7 - Supraganglionic infarction (M4-M6 cortex): 3 Total score (0-10 with 10 being normal): 10 IMPRESSION: 1. No acute intracranial process. 2. ASPECTS is 10 Code stroke imaging results were communicated on 10/23/2021 at 12:52 pm to provider Dr. Pearlean Brownie  via secure text paging. Electronically Signed   By: Merilyn Baba M.D.   On: 10/23/2021 12:53     PHYSICAL EXAM  Temp:  [98.2 F (36.8 C)-99.1 F (37.3 C)] 98.6 F (37 C) (06/17 1145) Pulse Rate:  [69-89] 83 (06/17 1145) Resp:  [14-23] 21 (06/17 1145) BP: (118-158)/(60-95) 144/90 (06/17 1145) SpO2:  [94 %-98 %] 96 % (06/17 1145)  General - Well nourished, well developed, in no apparent distress.  Ophthalmologic - fundi not visualized due to noncooperation.  Cardiovascular - Regular rhythm and rate.  Neuro - awake alert, orientated x3, answer questions appropriately, no aphasia, follows simple commands, mild dysarthria.  However left dense hemianopia, right gaze preference, left gaze incomplete, slight left facial droop, left upper extremity flaccid, left lower extremity 2-3/5.  Left-sided sensory loss. RUE and RLE 5/5, sensation intact. R FTN intact.    ASSESSMENT/PLAN Mr. Torino Gorden is a 71 y.o. male with history of hypertension, glaucoma, gout, TIA in  2018 admitted for left-sided numbness, left hand weakness and left leg gave out.  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   CTA head neck 6/9 showed left ICA 75% stenosis, poorly opacified past the anterior P2 segments and evaluation is significantly complicated by venous contamination. CT perfusion negative.   MRI 6/9 showed right PCA including thalamus small scattered infarcts.   CT repeat 6/12 showed increased size of subacute right PCA infarct.   CT head and neck 6/12 repeat showed right P2 occlusion, left P2 moderate stenosis, left ICA significance and soft plaque but no significant stenosis.   MRI repeat 6/14 showed large complete right PCA infarct including thalamus. EF 60 to 65% TEE no endocarditis Recommend 30-day cardiac event monitoring vs. Loop recorder to rule out A-fib at discharge LDL 52 A1c 5.8 lovenox for VTE prophylaxis aspirin 325 mg daily prior to admission, now on aspirin and Plavix DAPT for 3 months given right P2 occlusion and then plavix alone.  Patient also enrolled to [facility] clinical trial, continue investigational drug. Patient counseled to be compliant with his antithrombotic medications Ongoing aggressive stroke risk factor management Therapy recommendations:  CIR Disposition:  pending  Bacteremia Sepsis/septic shock 6/12 spike WBC to 23.2, now normalized AKI with creatinine 1.5, now normalized Tmax 100.1 -> afebrile Blood culture positive for MRSA, ID consulted, currently on vancomycin, and off Rocephin. ID on board TEE no endocarditis  History of TIA TIA in 2018, MRI negative for acute infarct, carotid Doppler negative.  MRA showed left P2 stenosis.  History of hypertension  Hypotension with septic shock Stable now Long term BP goal normotensive Avoid low  BP  Hyperlipidemia Home meds: Lipitor 40 LDL 52, goal < 70 Now on Lipitor 40 Continue statin at discharge  Other Stroke Risk Factors Advanced age  Other Active Problems gout  Hospital day # 7 Patient unfortunately had neurological worsening last week in the setting of sepsis with extension of his right PCA infarct.  TEE was negative for endocarditis.  Continue antibiotics as per primary team and transfer to inpatient rehab when bed available.  Continue current antiplatelet agents and [place] stroke study medication. On discussion patient and wife at the bedside and answered questions.  Greater than 50% time during this 35-minute visit was spent in counseling and coordination of care and discussion with patient and care team and answering  questions.  Antony Contras, MD Stroke Neurology 10/31/2021 1:01 PM    To contact Stroke Continuity provider, please refer to [email or web address] After hours, contact General Neurology

## 2021-11-01 DIAGNOSIS — R7881 Bacteremia: Secondary | ICD-10-CM | POA: Diagnosis not present

## 2021-11-01 DIAGNOSIS — I63531 Cerebral infarction due to unspecified occlusion or stenosis of right posterior cerebral artery: Secondary | ICD-10-CM | POA: Diagnosis not present

## 2021-11-01 DIAGNOSIS — I10 Essential (primary) hypertension: Secondary | ICD-10-CM | POA: Diagnosis not present

## 2021-11-01 DIAGNOSIS — N179 Acute kidney failure, unspecified: Secondary | ICD-10-CM | POA: Diagnosis not present

## 2021-11-01 LAB — BASIC METABOLIC PANEL
Anion gap: 12 (ref 5–15)
BUN: 14 mg/dL (ref 8–23)
CO2: 20 mmol/L — ABNORMAL LOW (ref 22–32)
Calcium: 8.6 mg/dL — ABNORMAL LOW (ref 8.9–10.3)
Chloride: 101 mmol/L (ref 98–111)
Creatinine, Ser: 0.79 mg/dL (ref 0.61–1.24)
GFR, Estimated: >60 mL/min (ref >60)
Glucose, Bld: 115 mg/dL — ABNORMAL HIGH (ref 70–99)
Potassium: 3.8 mmol/L (ref 3.5–5.1)
Sodium: 133 mmol/L — ABNORMAL LOW (ref 135–145)

## 2021-11-01 LAB — CBC
HCT: 49.9 % (ref 39.0–52.0)
Hemoglobin: 17.7 g/dL — ABNORMAL HIGH (ref 13.0–17.0)
MCH: 32.2 pg (ref 26.0–34.0)
MCHC: 35.5 g/dL (ref 30.0–36.0)
MCV: 90.9 fL (ref 80.0–100.0)
Platelets: 230 10*3/uL (ref 150–400)
RBC: 5.49 MIL/uL (ref 4.22–5.81)
RDW: 12.6 % (ref 11.5–15.5)
WBC: 11.3 10*3/uL — ABNORMAL HIGH (ref 4.0–10.5)
nRBC: 0 % (ref 0.0–0.2)

## 2021-11-01 LAB — VANCOMYCIN, TROUGH: Vancomycin Tr: 7 ug/mL — ABNORMAL LOW (ref 15–20)

## 2021-11-01 MED ORDER — KETOROLAC TROMETHAMINE 15 MG/ML IJ SOLN
15.0000 mg | Freq: Once | INTRAMUSCULAR | Status: AC | PRN
  Administered 2021-11-01: 15 mg via INTRAVENOUS
  Filled 2021-11-01: qty 1

## 2021-11-01 MED ORDER — VANCOMYCIN HCL 1750 MG/350ML IV SOLN
1750.0000 mg | INTRAVENOUS | Status: DC
  Administered 2021-11-02 – 2021-11-05 (×4): 1750 mg via INTRAVENOUS
  Filled 2021-11-01 (×6): qty 350

## 2021-11-01 NOTE — Progress Notes (Addendum)
Pharmacy Antibiotic Note  Roberto [name] is a 71 y.o. male with hospital associated MRSA and Strep pyogenes bacteremia. Had poison ivy about a week ago which he has been scratching, could have introduced bacteremia through those wounds. Pharmacy has been consulted for Vancomycin dosing.  Renal function stable at Scr 0.79 6/17 VP @ 1602= 31 6/18 VT @ 1139= 7 AUC 415 at lower end of goal (400-550), will increase dose slightly today  Plan: Adjust to Vancomycin 1750 mg IV q24h starting tomorrow (6/19) eAUC 484, Scr 0.79 Monitor renal function, micro data, and clinical progression Vancomycin levels as indicated  Height: 5\' 6"  (167.6 cm) Weight: 77.1 kg (169 lb 15.6 oz) IBW/kg (Calculated) : 63.8  Temp (24hrs), Avg:98.8 F (37.1 C), Min:97.9 F (36.6 C), Max:99.8 F (37.7 C)  Recent Labs  Lab 10/27/21 0353 10/27/21 0609 10/28/21 0149 10/29/21 0231 10/30/21 0219 10/31/21 0243 10/31/21 1459 10/31/21 1602 11/01/21 0314 11/01/21 1139  WBC 27.4*  --  11.1* 8.8 7.9 8.6  --   --  11.3*  --   CREATININE  --   --  1.04 0.85 0.85 0.75  --   --  0.79  --   LATICACIDVEN 1.7 1.2  --   --   --   --   --   --   --   --   VANCOTROUGH  --   --   --   --   --   --   --   --   --  7*  VANCOPEAK  --   --   --   --   --   --  35 31  --   --      Estimated Creatinine Clearance: 82.8 mL/min (by C-G formula based on SCr of 0.79 mg/dL).    No Known Allergies  Antimicrobials this admission:  Vancomycin 6/13>>  Clindamycin 6/13 >> 6/16  Cefepime x 1 on 6/13  Metronidazole x 1 on 6/13   Unasyn 6/13 x1  Dose adjustments this admission:  Vancomcyin 1500 mg IV q24h >> 1750 mg q24h  Microbiology results: 6/13 Bcx:GAS, MRSA 6/14 Bcx: NGTD  Thank you for allowing pharmacy to be a part of this patient's care.  7/14, PharmD PGY2 Infectious Diseases Pharmacy Resident   Please check [email or web address] for unit-specific pharmacy phone numbers

## 2021-11-01 NOTE — Progress Notes (Signed)
HD#8 SUBJECTIVE:  Patient Summary: Roberto [name] is a 71 y.o. with a pertinent PMH of CVA in 2017, who presented with L sided numbness and admitted for R PCA infarct now complicated by MRSA and strep pyogenes bacteremia.  Overnight Events: Lidocaine patch provided for back pain.  Interim History: Patient is doing well this morning but is concerned about L sided low back pain. The pain persists at rest and is exacerbated by movement.   OBJECTIVE:  Vital Signs: Vitals:   10/31/21 1145 10/31/21 2043 11/01/21 0021 11/01/21 0337  BP: (!) 144/90 (!) 149/69 114/68 132/82  Pulse: 83 83 77 88  Resp: (!) 21 20 17 19   Temp: 98.6 F (37 C) 99.8 F (37.7 C) 98.6 F (37 C) 97.9 F (36.6 C)  TempSrc: Oral Oral Oral Oral  SpO2: 96% 96% 97% 96%  Weight:      Height:       Supplemental O2: Room Air SpO2: 96 % O2 Flow Rate (L/min): 2 L/min  Filed Weights   10/23/21 1200 10/24/21 0316  Weight: 81.6 kg 77.1 kg     Intake/Output Summary (Last 24 hours) at 11/01/2021 0919 Last data filed at 11/01/2021 0345 Gross per 24 hour  Intake 120 ml  Output --  Net 120 ml    Net IO Since Admission: -2,172.39 mL [11/01/21 0919]  Physical Exam: Constitutional:Resting comfortably in bed in no acute distress. Cardio:Regular rate and rhythm. No murmurs, rubs, gallops. Pulm:Normal work of breathing on room air. Skin:Skin is warm and dry. Lower back is without rash, increased warmth, erythema, edema. No focal tenderness or bony abnormalities noted. No ecchymosis. Tenderness to palpation endorsed over L flank area. Neuro: Mental Status: Patient is awake, alert, oriented x3 No signs of aphasia or neglect. Cranial Nerves: III,IV, VI: Does not track to L. Endorses continued diminished vision of L visual field. VIII: Hearing is intact to voice Motor: RUE and RLE 5/5, LUE 2-3/5, LLE 4/5. Sensory: Sensation is normal on RUE and RLE, diminished on LUE and LLE. Psych: Pleasant mood and affect.  Patient  Lines/Drains/Airways Status     Active Line/Drains/Airways     Name Placement date Placement time Site Days   Peripheral IV 10/23/21 20 G Anterior;Distal;Left;Upper Arm 10/23/21  --  Arm  5   External Urinary Catheter 10/26/21  --  --  2             ASSESSMENT/PLAN:  Assessment: Principal Problem:   CVA (cerebral vascular accident) (HCC) Active Problems:   Essential hypertension   Aspiration pneumonia (HCC)   Erythrocytosis   Hyponatremia   Acute kidney injury (HCC)   Bacteremia   Plan: #MRSA and streptococcus pyogenes bacteremia Remains afebrile, hemodynamically stable. New mild leukocytosis to 11.3 overnight. Repeat blood culture shows NGTD. Given continued negative repeat blood culture, negative TEE, I am less concerned about seeding or recurrent bacteremia as source of leukocytosis at this time. -ID following, appreciate their assistance -Continue vancomycin -F/u repeat blood culture -Trend CBC, fever curve  #L flank pain Patient initially had complaint of back pain 06/17, now subjectively worse both at rest and with any attempts at movement. Urine appeared orange tinted in cannister this morning and I am unable to recall if this was the case yesterday as well. The pain is to his L flank with further detailed exam above. He denies urinary symptoms at this time. -Will check UA given new leukocytosis and flank pain to assess for acute cystitis vs possible nephrolithiasis -Monitor closely for  rash -Continue lidocaine patch  #R PCA Infarct Neurological exam is stable. Each day he shows slight improvement in L sided function which is reassuring from a rehabilitation standpoint. Again, I believe CIR is his best bet for optimal recovery. -Neurology following, appreciate their assistance             -[facility] Stroke Study -SCDs only for VTE prophylaxis -Continue atorvastatin 80 mg daily -Continue ASA 81 mg daily x 3 weeks -Continue Plavix 75 mg daily -Lifestyle  modifications for pre-diabetes and hypertriglyceridemia  -F/u PT/OT recs and CIR decision Monday  #Acute stress reaction Mood is appropriate this morning. I am hopeful that he will have continued improvements in mood with further encouragement and progress. -CTM. Will plan to recommend IBH referral on discharge for ongoing psychological support.   #Erythrocytosis Hgb stable today at 17.7. -F/u JAK2 -Trend CBC   #Hypertension Normotensive. Home losartan restarted at half dose, 25 mg daily, 06/17. -Continue losartan at 25 mg daily  #NAGMA #Hyponatremia Stable with bicarb of 20 this morning (18 06/17). Na 133, improved from 130.  -Will check urine sodium, osm -Trend BMP  Best Practice: Diet: Regular IVF: None VTE: Place and maintain sequential compression device Start: 10/26/21 1121 Code: Full AB: Vancomycin Therapy Recs: CIR Family Contact: [name] and [name] (partner), called and notified. DISPO: Anticipated discharge pending Medical stability and approval for CIR/insurance auth .  Signature: Champ Mungo, D.O.  Internal Medicine Resident, PGY-1 [facility]  Pager: [phone] 9:19 AM, 11/01/2021   Please contact the on call pager after 5 pm and on weekends at [phone].

## 2021-11-01 NOTE — Plan of Care (Signed)
  Problem: Safety: Goal: Ability to remain free from injury will improve Outcome: Progressing   Problem: Education: Goal: Knowledge of disease or condition will improve Outcome: Progressing   Problem: Self-Care: Goal: Ability to participate in self-care as condition permits will improve Outcome: Progressing   Problem: Ischemic Stroke/TIA Tissue Perfusion: Goal: Complications of ischemic stroke/TIA will be minimized Outcome: Progressing

## 2021-11-01 NOTE — Progress Notes (Signed)
STROKE TEAM PROGRESS NOTE   SUBJECTIVE (INTERVAL HISTORY) His wife is at the bedside.  Patient yesterday and overnight complained of flank pain, primary team is working on that. He did not have severe flank pain on my rounding. However, he still has dense left HH, left UE plegia but left LE 3/5 with gradual drift.   OBJECTIVE Temp:  [97.9 F (36.6 C)-99.8 F (37.7 C)] 97.9 F (36.6 C) (06/18 0337) Pulse Rate:  [77-88] 88 (06/18 0337) Cardiac Rhythm: Normal sinus rhythm (06/18 0701) Resp:  [17-21] 19 (06/18 0337) BP: (114-149)/(68-90) 132/82 (06/18 0337) SpO2:  [96 %-97 %] 96 % (06/18 0337)  Recent Labs  Lab 10/26/21 1922  GLUCAP 139*   Recent Labs  Lab 10/28/21 0149 10/29/21 0231 10/30/21 0219 10/31/21 0243 11/01/21 0314  NA 129* 129* 133* 130* 133*  K 3.6 3.9 3.5 3.9 3.8  CL 104 100 103 102 101  CO2 17* 20* 21* 18* 20*  GLUCOSE 120* 108* 126* 100* 115*  BUN 28* 24* 18 14 14   CREATININE 1.04 0.85 0.85 0.75 0.79  CALCIUM 8.2* 8.0* 8.2* 8.3* 8.6*   Recent Labs  Lab 10/27/21 0353  AST 23  ALT 24  ALKPHOS 63  BILITOT 1.4*  PROT 7.4  ALBUMIN 3.2*   Recent Labs  Lab 10/27/21 0353 10/28/21 0149 10/29/21 0231 10/30/21 0219 10/31/21 0243 11/01/21 0314  WBC 27.4* 11.1* 8.8 7.9 8.6 11.3*  NEUTROABS 23.9*  --   --   --   --   --   HGB 18.8* 16.8 16.3 16.4 17.6* 17.7*  HCT 53.8* 48.4 46.3 46.8 49.5 49.9  MCV 92.1 92.7 91.7 91.2 90.5 90.9  PLT 202 162 151 164 175 230   No results for input(s): "CKTOTAL", "CKMB", "CKMBINDEX", "TROPONINI" in the last 168 hours. No results for input(s): "LABPROT", "INR" in the last 72 hours. No results for input(s): "COLORURINE", "LABSPEC", "PHURINE", "GLUCOSEU", "HGBUR", "BILIRUBINUR", "KETONESUR", "PROTEINUR", "UROBILINOGEN", "NITRITE", "LEUKOCYTESUR" in the last 72 hours.  Invalid input(s): "APPERANCEUR"     Component Value Date/Time   CHOL 173 10/23/2021 1247   TRIG 272 (H) 10/23/2021 1247   HDL 67 10/23/2021 1247   CHOLHDL  2.6 10/23/2021 1247   VLDL 54 (H) 10/23/2021 1247   LDLCALC 52 10/23/2021 1247   Lab Results  Component Value Date   HGBA1C 5.8 (H) 10/23/2021      Component Value Date/Time   LABOPIA NONE DETECTED 12/09/2016 0900   COCAINSCRNUR NONE DETECTED 12/09/2016 0900   LABBENZ NONE DETECTED 12/09/2016 0900   AMPHETMU NONE DETECTED 12/09/2016 0900   THCU NONE DETECTED 12/09/2016 0900   LABBARB NONE DETECTED 12/09/2016 0900    No results for input(s): "ETH" in the last 168 hours.  I have personally reviewed the radiological images below and agree with the radiology interpretations.  VAS [place] UPPER EXTREMITY VENOUS DUPLEX  Result Date: 10/31/2021 UPPER VENOUS STUDY  Patient Name:  Roberto [name]  Date of Exam:   10/30/2021 Medical Rec #: [id number]    Accession #:    [id number] Date of Birth: 1951/04/12    Patient Gender: M Patient Age:   11 years Exam Location:  [facility] Procedure:      VAS [place] UPPER EXTREMITY VENOUS DUPLEX Referring Phys: Burna Cash [facility] --------------------------------------------------------------------------------  Indications: Swelling & redness Comparison Study: No previous exams Performing Technologist: Jody Hill RVT, RDMS  Examination Guidelines: A complete evaluation includes B-mode imaging, spectral Doppler, color Doppler, and power Doppler as needed of all accessible portions of each  vessel. Bilateral testing is considered an integral part of a complete examination. Limited examinations for reoccurring indications may be performed as noted.  Right Findings: +----------+------------+---------+-----------+----------+--------------+ RIGHT     CompressiblePhasicitySpontaneousProperties   Summary     +----------+------------+---------+-----------+----------+--------------+ IJV           Full       Yes       Yes                             +----------+------------+---------+-----------+----------+--------------+ Subclavian    Full       Yes       Yes                              +----------+------------+---------+-----------+----------+--------------+ Axillary      Full       Yes       Yes                             +----------+------------+---------+-----------+----------+--------------+ Brachial      Full       Yes       Yes                             +----------+------------+---------+-----------+----------+--------------+ Radial        Full                                                 +----------+------------+---------+-----------+----------+--------------+ Ulnar         Full                                                 +----------+------------+---------+-----------+----------+--------------+ Cephalic                                            Not visualized +----------+------------+---------+-----------+----------+--------------+ Basilic       None       No        No                   Acute      +----------+------------+---------+-----------+----------+--------------+  Left Findings: +----------+------------+---------+-----------+----------+-------+ LEFT      CompressiblePhasicitySpontaneousPropertiesSummary +----------+------------+---------+-----------+----------+-------+ Subclavian    Full       Yes       Yes                      +----------+------------+---------+-----------+----------+-------+  Summary:  Right: No evidence of deep vein thrombosis in the upper extremity. Findings consistent with acute superficial vein thrombosis involving the right basilic vein.  Left: No evidence of thrombosis in the subclavian.  *See table(s) above for measurements and observations.  Diagnosing physician: Jamelle Haring Electronically signed by Jamelle Haring on 10/31/2021 at 12:14:40 PM.    Final    ECHO TEE  Result Date: 10/30/2021    TRANSESOPHOGEAL ECHO REPORT   Patient Name:   Roberto [name] Date of Exam: 10/30/2021 Medical Rec #:  [id number]   Height:       66.0 in Accession #:    [id number]  Weight:       170.0 lb  Date of Birth:  03/31/1951   BSA:          1.866 m Patient Age:    71 years    BP:           126/79 mmHg Patient Gender: M           HR:           95 bpm. Exam Location:  Inpatient Procedure: Transesophageal Echo, Cardiac Doppler, Color Doppler and Saline            Contrast Bubble Study Indications:     Bacteremia                  CVA  History:         Patient has prior history of Echocardiogram examinations, most                  recent 10/24/2021. Risk Factors:Hypertension. Bacteremia.  Sonographer:     Clayton Lefort RDCS (AE) Referring Phys:  [id number] Rosalene Billings ROBERTS Diagnosing Phys: Buford Dresser MD PROCEDURE: After discussion of the risks and benefits of a TEE, an informed consent was obtained from the patient. The transesophogeal probe was passed without difficulty through the esophogus of the patient. Sedation performed by different physician. The patient was monitored while under deep sedation. Anesthestetic sedation was provided intravenously by Anesthesiology: 290.17mg  of Propofol. Image quality was good. The patient developed no complications during the procedure. IMPRESSIONS  1. Left ventricular ejection fraction, by estimation, is 60 to 65%. The left ventricle has normal function.  2. Right ventricular systolic function is normal. The right ventricular size is normal.  3. No left atrial/left atrial appendage thrombus was detected.  4. The mitral valve is normal in structure. Trivial mitral valve regurgitation. No evidence of mitral stenosis.  5. The aortic valve is tricuspid. Aortic valve regurgitation is not visualized. No aortic stenosis is present.  6. There is Moderate (Grade III) plaque involving the descending aorta.  7. Evidence of atrial level shunting detected by color flow Doppler. Agitated saline contrast bubble study was positive with shunting observed within 3-6 cardiac cycles suggestive of interatrial shunt. Conclusion(s)/Recommendation(s): No evidence of vegetation/infective  endocarditis on this transesophageael echocardiogram. 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 FINDINGS  Left Ventricle: Left ventricular ejection fraction, by estimation, is 60 to 65%. The left ventricle has normal function. The left ventricular internal cavity size was normal in size. Right Ventricle: The right ventricular size is normal. No increase in right ventricular wall thickness. Right ventricular systolic function is normal. Left Atrium: Left atrial size was normal in size. No left atrial/left atrial appendage thrombus was detected. Right Atrium: Right atrial size was normal in size. Prominent Eustachian valve. Pericardium: There is no evidence of pericardial effusion. Mitral Valve: The mitral valve is normal in structure. Trivial mitral valve regurgitation. No evidence of mitral valve stenosis. There is no evidence of mitral valve vegetation. Tricuspid Valve: The tricuspid valve is normal in structure. Tricuspid valve regurgitation is not demonstrated. No evidence of tricuspid stenosis. There is no evidence of tricuspid valve vegetation. Aortic Valve: The aortic valve is tricuspid. Aortic valve regurgitation is not visualized. No aortic stenosis is present. There is no evidence of aortic valve vegetation. Pulmonic Valve: The pulmonic valve was grossly normal. Pulmonic valve regurgitation is  not visualized. No evidence of pulmonic stenosis. There is no evidence of pulmonic valve vegetation. Aorta: The aortic root and ascending aorta are structurally normal, with no evidence of dilitation. There is moderate (Grade III) plaque involving the descending aorta. IAS/Shunts: Evidence of atrial level shunting detected by color flow Doppler. Agitated saline contrast was given intravenously to evaluate for intracardiac shunting. Agitated saline contrast bubble study was positive with shunting observed within 3-6 cardiac cycles suggestive of interatrial shunt. Buford Dresser MD  Electronically signed by Buford Dresser MD Signature Date/Time: 10/30/2021/4:58:49 PM    Final    MR BRAIN WO CONTRAST  Result Date: 10/28/2021 CLINICAL DATA:  Follow-up examination for stroke. EXAM: MRI HEAD WITHOUT CONTRAST TECHNIQUE: Multiplanar, multiecho pulse sequences of the brain and surrounding structures were obtained without intravenous contrast. COMPARISON:  Comparison made with previous MRI from 10/23/2021. FINDINGS: Brain: Examination moderately degraded by motion artifact. There has been interval worsening and expansion of previously identified right PCA distribution infarct since previous MRI, now fairly large in size and involving the majority of the right PCA distribution.Minimal associated petechial blood products without frank hemorrhagic transformation (series 7, image 64) Heidelberg classification 1a: HI1, scattered small petechiae, no mass effect. No other definite acute or subacute infarct identified. No made of an apparent punctate focus of diffusion abnormality involving the left ventral pons on axial image 13, not convincingly seen on corresponding sequences, and favored to be artifactual. Gray-white matter differentiation otherwise maintained. No other acute or chronic intracranial blood products. Underlying age-related cerebral atrophy with mild chronic small vessel ischemic disease again noted. No mass lesion or midline shift. No extra-axial fluid collection. No hydrocephalus. Pituitary gland suprasellar region grossly within normal limits. Vascular: Major intracranial vascular flow voids are maintained at the skull base. Skull and upper cervical spine: Craniocervical junction with normal limits. Bone marrow signal intensity grossly within normal limits. No scalp soft tissue abnormality. Sinuses/Orbits: Globes orbital soft tissues demonstrate no acute finding. Paranasal sinuses are largely clear. No significant mastoid effusion. Other: None. IMPRESSION: 1. Motion degraded  exam. 2. Interval worsening and expansion of previously identified right PCA distribution infarct, now fairly large in and involving the majority of the right PCA distribution. Minimal associated petechial blood products without frank hemorrhagic transformation. 3. No other new acute intracranial abnormality. Electronically Signed   By: Jeannine Boga M.D.   On: 10/28/2021 03:47   DG CHEST PORT 1 VIEW  Result Date: 10/27/2021 CLINICAL DATA:  [id number] shortness of breath and hypoxia EXAM: PORTABLE CHEST 1 VIEW COMPARISON:  Chest x-ray from yesterday FINDINGS: The heart size and mediastinal contours are within normal limits. Stable atheromatous calcifications at the arch of the aorta low lung volumes. Minimal atelectasis at the left lung base. No focal consolidation, pleural effusion or vascular congestion. The visualized skeletal structures are unremarkable. IMPRESSION: Minimal atelectasis at the left lung base. Low lung volumes. There has been no significant interval change. Electronically Signed   By: Frazier Richards M.D.   On: 10/27/2021 11:19   DG CHEST PORT 1 VIEW  Result Date: 10/26/2021 CLINICAL DATA:  Altered mental status. EXAM: PORTABLE CHEST 1 VIEW COMPARISON:  Chest x-ray 12/08/2016. FINDINGS: The heart size and mediastinal contours are within normal limits. There are atherosclerotic calcifications of the aorta. Both lungs are clear. The visualized skeletal structures are unremarkable. IMPRESSION: No active disease. Electronically Signed   By: Ronney Asters M.D.   On: 10/26/2021 23:45   CT ANGIO HEAD CODE STROKE  Result Date: 10/26/2021 CLINICAL DATA:  Initial evaluation for neuro deficit, stroke suspected. EXAM: CT ANGIOGRAPHY HEAD AND NECK TECHNIQUE: Multidetector CT imaging of the head and neck was performed using the standard protocol during bolus administration of intravenous contrast. Multiplanar CT image reconstructions and MIPs were obtained to evaluate the vascular anatomy. Carotid  stenosis measurements (when applicable) are obtained utilizing NASCET criteria, using the distal internal carotid diameter as the denominator. RADIATION DOSE REDUCTION: This exam was performed according to the departmental dose-optimization program which includes automated exposure control, adjustment of the mA and/or kV according to patient size and/or use of iterative reconstruction technique. CONTRAST:  29mL OMNIPAQUE IOHEXOL 350 MG/ML SOLN COMPARISON:  Prior CT from earlier the same day as well as earlier studies. FINDINGS: CTA NECK FINDINGS Aortic arch: Visualized aortic arch normal in caliber with standard branching pattern. Moderate aortic atherosclerosis. No significant stenosis about the origin the great vessels. Right carotid system: Right common and internal carotid arteries patent without significant stenosis or dissection. Eccentric atheromatous plaque about the right carotid bulb without significant stenosis. Left carotid system: Left CCA patent without stenosis. Predominant soft plaque at the left carotid bulb/proximal left ICA without hemodynamically significant stenosis. Left ICA patent distally without stenosis or dissection. Vertebral arteries: Both vertebral arteries arise from the subclavian arteries. No proximal subclavian artery stenosis. Both vertebral arteries widely patent without stenosis, dissection or occlusion. Skeleton: No discrete or worrisome osseous lesions. Advanced degenerative spondylosis present at C4-5 through C6-7. Grade 1 facet mediated anterolisthesis of C7 on T1. Poor dentition noted. Other neck: No other acute soft tissue abnormality within the neck. Upper chest: Visualized upper chest demonstrates no acute finding. Review of the MIP images confirms the above findings CTA HEAD FINDINGS Anterior circulation: Petrous segments patent bilaterally. Scattered atheromatous change within the carotid siphons without hemodynamically significant stenosis. A1 segments, anterior  communicating artery complex common anterior cerebral arteries patent without stenosis. No M1 stenosis or occlusion. No proximal MCA branch occlusion. Distal MCA branches perfused and symmetric. Posterior circulation: Both vertebral arteries patent without stenosis. Both PICA patent at their origins. Basilar patent to its distal aspect without stenosis. Superior cerebellar arteries patent bilaterally. Left PCA supplied via a hypoplastic left P1 segment and robust left posterior communicating artery. Focal moderate proximal left P2 stenosis noted (series 12, image 21). Left PCA otherwise patent to its distal aspect. Right PCA appears to largely occluded at the origin of the right P2 segment. Finding is in keeping with the evolving early subacute right PCA distribution infarct. Venous sinuses: Grossly patent allowing for timing the contrast bolus. Anatomic variants: None significant. Review of the MIP images confirms the above findings IMPRESSION: 1. Occlusion of the proximal right P2 segment, in keeping with the evolving early subacute right PCA distribution infarct. 2. No other emergent large vessel occlusion. 3. Moderate proximal left P2 stenosis. 4. Mild-to-moderate atheromatous change about the carotid bifurcations and carotid siphons. No other hemodynamically significant or correctable stenosis. 5. Aortic Atherosclerosis (ICD10-I70.0). These results were communicated to Dr. Cheral Marker at 8:36 pm on 10/26/2021 by text page via the [facility] messaging system. Electronically Signed   By: Jeannine Boga M.D.   On: 10/26/2021 20:38   CT ANGIO NECK CODE STROKE  Result Date: 10/26/2021 CLINICAL DATA:  Initial evaluation for neuro deficit, stroke suspected. EXAM: CT ANGIOGRAPHY HEAD AND NECK TECHNIQUE: Multidetector CT imaging of the head and neck was performed using the standard protocol during bolus administration of intravenous contrast. Multiplanar CT image reconstructions and MIPs were obtained to evaluate the  vascular anatomy.  Carotid stenosis measurements (when applicable) are obtained utilizing NASCET criteria, using the distal internal carotid diameter as the denominator. RADIATION DOSE REDUCTION: This exam was performed according to the departmental dose-optimization program which includes automated exposure control, adjustment of the mA and/or kV according to patient size and/or use of iterative reconstruction technique. CONTRAST:  76mL OMNIPAQUE IOHEXOL 350 MG/ML SOLN COMPARISON:  Prior CT from earlier the same day as well as earlier studies. FINDINGS: CTA NECK FINDINGS Aortic arch: Visualized aortic arch normal in caliber with standard branching pattern. Moderate aortic atherosclerosis. No significant stenosis about the origin the great vessels. Right carotid system: Right common and internal carotid arteries patent without significant stenosis or dissection. Eccentric atheromatous plaque about the right carotid bulb without significant stenosis. Left carotid system: Left CCA patent without stenosis. Predominant soft plaque at the left carotid bulb/proximal left ICA without hemodynamically significant stenosis. Left ICA patent distally without stenosis or dissection. Vertebral arteries: Both vertebral arteries arise from the subclavian arteries. No proximal subclavian artery stenosis. Both vertebral arteries widely patent without stenosis, dissection or occlusion. Skeleton: No discrete or worrisome osseous lesions. Advanced degenerative spondylosis present at C4-5 through C6-7. Grade 1 facet mediated anterolisthesis of C7 on T1. Poor dentition noted. Other neck: No other acute soft tissue abnormality within the neck. Upper chest: Visualized upper chest demonstrates no acute finding. Review of the MIP images confirms the above findings CTA HEAD FINDINGS Anterior circulation: Petrous segments patent bilaterally. Scattered atheromatous change within the carotid siphons without hemodynamically significant stenosis. A1  segments, anterior communicating artery complex common anterior cerebral arteries patent without stenosis. No M1 stenosis or occlusion. No proximal MCA branch occlusion. Distal MCA branches perfused and symmetric. Posterior circulation: Both vertebral arteries patent without stenosis. Both PICA patent at their origins. Basilar patent to its distal aspect without stenosis. Superior cerebellar arteries patent bilaterally. Left PCA supplied via a hypoplastic left P1 segment and robust left posterior communicating artery. Focal moderate proximal left P2 stenosis noted (series 12, image 21). Left PCA otherwise patent to its distal aspect. Right PCA appears to largely occluded at the origin of the right P2 segment. Finding is in keeping with the evolving early subacute right PCA distribution infarct. Venous sinuses: Grossly patent allowing for timing the contrast bolus. Anatomic variants: None significant. Review of the MIP images confirms the above findings IMPRESSION: 1. Occlusion of the proximal right P2 segment, in keeping with the evolving early subacute right PCA distribution infarct. 2. No other emergent large vessel occlusion. 3. Moderate proximal left P2 stenosis. 4. Mild-to-moderate atheromatous change about the carotid bifurcations and carotid siphons. No other hemodynamically significant or correctable stenosis. 5. Aortic Atherosclerosis (ICD10-I70.0). These results were communicated to Dr. Cheral Marker at 8:36 pm on 10/26/2021 by text page via the [facility] messaging system. Electronically Signed   By: Jeannine Boga M.D.   On: 10/26/2021 20:38   CT HEAD CODE STROKE WO CONTRAST`  Result Date: 10/26/2021 CLINICAL DATA:  Code stroke. EXAM: CT HEAD WITHOUT CONTRAST TECHNIQUE: Contiguous axial images were obtained from the base of the skull through the vertex without intravenous contrast. RADIATION DOSE REDUCTION: This exam was performed according to the departmental dose-optimization program which includes  automated exposure control, adjustment of the mA and/or kV according to patient size and/or use of iterative reconstruction technique. COMPARISON:  Prior study from 10/24/2021. FINDINGS: Brain: Evolving cytotoxic edema involving the mesial right temporal occipital region and right thalamus, compatible with early subacute right PCA distribution infarct, increased in size as compared to  previous. No associated hemorrhage or significant regional mass effect. No other new large vessel territory infarct. No other acute intracranial hemorrhage. No mass lesion or midline shift. No hydrocephalus or extra-axial fluid collection. Vascular: No hyperdense vessel. Skull: Scalp soft tissues and calvarium demonstrate no acute finding. Sinuses/Orbits: /globes and orbital soft tissues within normal limits. Paranasal sinuses and mastoid air cells are clear. Other: None. ASPECTS [facility] Stroke Program Early CT Score) - Ganglionic level infarction (caudate, lentiform nuclei, internal capsule, insula, M1-M3 cortex): 7 - Supraganglionic infarction (M4-M6 cortex): 3 Total score (0-10 with 10 being normal): 10 IMPRESSION: 1. Increased size of evolving early subacute right PCA distribution infarct. No evidence for hemorrhagic transformation or significant regional mass effect. 2. No other new acute intracranial abnormality. 3. ASPECTS is 10. These results were communicated to Dr. Cheral Marker at 8:01 pm on 10/26/2021 by text page via the [facility] messaging system. Electronically Signed   By: Jeannine Boga M.D.   On: 10/26/2021 20:04   ECHOCARDIOGRAM COMPLETE  Result Date: 10/24/2021    ECHOCARDIOGRAM REPORT   Patient Name:   Roberto [name] Date of Exam: 10/24/2021 Medical Rec #:  [id number]   Height:       66.0 in Accession #:    [id number]  Weight:       170.0 lb Date of Birth:  20-Mar-1951   BSA:          1.866 m Patient Age:    11 years    BP:           146/96 mmHg Patient Gender: M           HR:           73 bpm. Exam Location:  Inpatient  Procedure: 2D Echo, Cardiac Doppler and Color Doppler Indications:    Stroke  History:        Patient has prior history of Echocardiogram examinations, most                 recent 12/14/2016. Stroke.  Sonographer:    Merrie Roof RDCS Referring Phys: [id number] [place]  1. Left ventricular ejection fraction, by estimation, is 60 to 65%. The left ventricle has normal function. The left ventricle has no regional wall motion abnormalities. Left ventricular diastolic parameters are consistent with Grade I diastolic dysfunction (impaired relaxation).  2. Right ventricular systolic function is normal. The right ventricular size is normal.  3. The mitral valve is normal in structure. No evidence of mitral valve regurgitation. No evidence of mitral stenosis.  4. The aortic valve is tricuspid. Aortic valve regurgitation is not visualized. Aortic valve sclerosis is present, with no evidence of aortic valve stenosis.  5. The inferior vena cava is normal in size with greater than 50% respiratory variability, suggesting right atrial pressure of 3 mmHg. FINDINGS  Left Ventricle: Left ventricular ejection fraction, by estimation, is 60 to 65%. The left ventricle has normal function. The left ventricle has no regional wall motion abnormalities. The left ventricular internal cavity size was normal in size. There is  no left ventricular hypertrophy. Left ventricular diastolic parameters are consistent with Grade I diastolic dysfunction (impaired relaxation). Right Ventricle: The right ventricular size is normal. Right ventricular systolic function is normal. Left Atrium: Left atrial size was normal in size. Right Atrium: Right atrial size was normal in size. Pericardium: There is no evidence of pericardial effusion. Mitral Valve: The mitral valve is normal in structure. No evidence of mitral valve regurgitation. No evidence of mitral  valve stenosis. Tricuspid Valve: The tricuspid valve is normal in structure. Tricuspid  valve regurgitation is not demonstrated. No evidence of tricuspid stenosis. Aortic Valve: The aortic valve is tricuspid. Aortic valve regurgitation is not visualized. Aortic valve sclerosis is present, with no evidence of aortic valve stenosis. Aortic valve mean gradient measures 1.0 mmHg. Aortic valve peak gradient measures 2.8  mmHg. Aortic valve area, by VTI measures 2.65 cm. Pulmonic Valve: The pulmonic valve was normal in structure. Pulmonic valve regurgitation is not visualized. No evidence of pulmonic stenosis. Aorta: The aortic root is normal in size and structure. Venous: The inferior vena cava is normal in size with greater than 50% respiratory variability, suggesting right atrial pressure of 3 mmHg. IAS/Shunts: No atrial level shunt detected by color flow Doppler.  LEFT VENTRICLE PLAX 2D LVIDd:         3.60 cm   Diastology LVIDs:         2.60 cm   LV e' medial:    5.55 cm/s LV PW:         1.10 cm   LV E/e' medial:  6.3 LV IVS:        1.20 cm   LV e' lateral:   6.74 cm/s LVOT diam:     2.00 cm   LV E/e' lateral: 5.2 LV SV:         42 LV SV Index:   23 LVOT Area:     3.14 cm  RIGHT VENTRICLE RV Basal diam:  3.50 cm RV S prime:     11.50 cm/s TAPSE (M-mode): 2.1 cm LEFT ATRIUM             Index        RIGHT ATRIUM           Index LA diam:        3.70 cm 1.98 cm/m   RA Area:     21.30 cm LA Vol (A2C):   59.2 ml 31.72 ml/m  RA Volume:   64.10 ml  34.35 ml/m LA Vol (A4C):   42.3 ml 22.67 ml/m LA Biplane Vol: 50.7 ml 27.17 ml/m  AORTIC VALVE AV Area (Vmax):    2.64 cm AV Area (Vmean):   2.62 cm AV Area (VTI):     2.65 cm AV Vmax:           83.40 cm/s AV Vmean:          56.500 cm/s AV VTI:            0.159 m AV Peak Grad:      2.8 mmHg AV Mean Grad:      1.0 mmHg LVOT Vmax:         70.20 cm/s LVOT Vmean:        47.200 cm/s LVOT VTI:          0.134 m LVOT/AV VTI ratio: 0.84  AORTA Ao Root diam: 3.60 cm Ao Asc diam:  3.50 cm MITRAL VALVE MV Area (PHT): 3.91 cm    SHUNTS MV Decel Time: 194 msec     Systemic VTI:  0.13 m MV E velocity: 35.10 cm/s  Systemic Diam: 2.00 cm MV A velocity: 88.00 cm/s MV E/A ratio:  0.40 Olga Millers MD Electronically signed by Olga Millers MD Signature Date/Time: 10/24/2021/5:14:34 PM    Final    CT HEAD WO CONTRAST ( )  Result Date: 10/24/2021 CLINICAL DATA:  Neuro deficit with acute stroke suspected. Worsening symptoms EXAM: CT HEAD WITHOUT CONTRAST TECHNIQUE: Contiguous  axial images were obtained from the base of the skull through the vertex without intravenous contrast. RADIATION DOSE REDUCTION: This exam was performed according to the departmental dose-optimization program which includes automated exposure control, adjustment of the mA and/or kV according to patient size and/or use of iterative reconstruction technique. COMPARISON:  Brain MRI from yesterday FINDINGS: Brain: Known acute infarction in the right PCA distribution affecting thalamus and occipital cortex. No hemorrhagic conversion or new territory of involvement seen. There may be more confluent right occipital inferior cortex involvement when compared to the preceding brain MRI. No hemorrhage, hydrocephalus, or masslike finding Vascular: No hyperdense vessel or unexpected calcification. Skull: Normal. Negative for fracture or focal lesion. Sinuses/Orbits: No acute finding. IMPRESSION: Known acute infarction in the right PCA distribution. There may be more confluent involvement in the right occipital cortex than on the preceding brain MRI. No hemorrhagic conversion or new territory distribution. Electronically Signed   By: Jorje Guild M.D.   On: 10/24/2021 09:55   MR BRAIN WO CONTRAST  Result Date: 10/23/2021 CLINICAL DATA:  Acute neurologic deficit EXAM: MRI HEAD WITHOUT CONTRAST TECHNIQUE: Multiplanar, multiecho pulse sequences of the brain and surrounding structures were obtained without intravenous contrast. COMPARISON:  12/09/2016 FINDINGS: Brain: There are areas of abnormal diffusion restriction  within the right PCA territory, involving the dorsal thalamus, medial right temporal lobe and the right occipital lobe. No acute or chronic hemorrhage. There is multifocal hyperintense T2-weighted signal within the white matter. Generalized cerebral volume loss. The midline structures are normal. Vascular: Major flow voids are preserved. Skull and upper cervical spine: Normal calvarium and skull base. Visualized upper cervical spine and soft tissues are normal. Sinuses/Orbits:No paranasal sinus fluid levels or advanced mucosal thickening. No mastoid or middle ear effusion. Normal orbits. IMPRESSION: Multifocal acute ischemia within the right PCA territory, involving the dorsal thalamus, medial right temporal lobe and the right occipital lobe. No hemorrhage or mass effect. Electronically Signed   By: Ulyses Jarred M.D.   On: 10/23/2021 19:49   CT ANGIO HEAD NECK W WO CM W PERF (CODE STROKE)  Result Date: 10/23/2021 CLINICAL DATA:  Stroke suspected EXAM: CT ANGIOGRAPHY HEAD AND NECK CT PERFUSION BRAIN TECHNIQUE: Multidetector CT imaging of the head and neck was performed using the standard protocol during bolus administration of intravenous contrast. Multiplanar CT image reconstructions and MIPs were obtained to evaluate the vascular anatomy. Carotid stenosis measurements (when applicable) are obtained utilizing NASCET criteria, using the distal internal carotid diameter as the denominator. Multiphase CT imaging of the brain was performed following IV bolus contrast injection. Subsequent parametric perfusion maps were calculated using RAPID software. RADIATION DOSE REDUCTION: This exam was performed according to the departmental dose-optimization program which includes automated exposure control, adjustment of the mA and/or kV according to patient size and/or use of iterative reconstruction technique. CONTRAST:  182mL OMNIPAQUE IOHEXOL 350 MG/ML SOLN COMPARISON:  No prior CTA, correlation is made with 12/09/2016  MRA head and 10/23/2021 CT head FINDINGS: CT HEAD FINDINGS For noncontrast findings, please see same day CT head. CTA NECK FINDINGS Evaluation is limited by bolus timing and significant contrast in the venous system. Aortic arch: Standard branching. Imaged portion shows no evidence of aneurysm or dissection. No significant stenosis of the major arch vessel origins. Aortic atherosclerosis Right carotid system: No evidence of dissection, occlusion, or hemodynamically significant stenosis (greater than 50%). Atherosclerotic disease at the bifurcation and in the proximal ICA is not hemodynamically significant. Left carotid system: Approximally 75% luminal narrowing of the  proximal left ICA secondary to primarily noncalcified plaque no evidence of dissection or occlusion. Vertebral arteries: No evidence of dissection, occlusion, or hemodynamically significant stenosis (greater than 50%). Skeleton: Degenerative changes in the cervical spine with significant disc height loss. Poor dentition with multifocal periapical lucency and dental caries. Other neck: No acute finding. Upper chest: No focal pulmonary opacity or pleural effusion in the imaged lungs. Review of the MIP images confirms the above findings CTA HEAD FINDINGS Evaluation is limited by bolus timing and degree of venous contamination. Anterior circulation: Both internal carotid arteries are patent to the termini, with mild narrowing in the bilateral cavernous carotids. A1 segments patent. Normal anterior communicating artery. Evaluation of the more distal anterior cerebral arteries is limited by poor opacification but appears grossly patent to the distal aspects. No M1 stenosis or occlusion. Normal MCA bifurcations. Evaluation of the more distal MCAs is limited by poor opacification, but they appear grossly patent to their distal aspects. Posterior circulation: Vertebral arteries patent to the vertebrobasilar junction without stenosis. Basilar patent to its  distal aspect. Superior cerebellar arteries are not well opacified. Patent P1 segments. PCAs perfused proximally but are poorly opacified past the anterior P2 segments and evaluation is significantly complicated by venous contamination. The bilateral posterior communicating arteries are not definitively visualized. Venous sinuses: As permitted by contrast timing, patent. Anatomic variants: None significant. Review of the MIP images confirms the above findings CT Brain Perfusion Findings: ASPECTS: 10 CBF (<30%) Volume: 46mL Perfusion (Tmax>6.0s) volume: 180mL Mismatch Volume: 160mL Infarction Location:No infarct core. Decreased perfusion is noted in the bilateral cerebral and cerebellar hemispheres, not conforming to any particular vascular territory but possibly involving the bilateral watershed areas. Some or all of this may also be secondary to motion. IMPRESSION: 1. Evaluation is limited by poor bolus timing and venous contamination. Within this limitation, there is approximately 75% stenosis in the proximal left ICA. No other hemodynamically significant stenosis in the neck. 2. Evaluation of the distal intracranial vasculature is severely limited, as described above. The proximal intracranial vasculature appears patent. 3. On CT perfusion, no infarct core is detected, but there is decreased perfusion in the bilateral cerebral and cerebellar hemispheres, some of which may be in a watershed distribution; however, some or all be artifactual. Imaging results were communicated on 10/23/2021 at 1:48 pm to provider Dr. Leonie Man via secure text paging. Electronically Signed   By: Merilyn Baba M.D.   On: 10/23/2021 13:49   CT HEAD CODE STROKE WO CONTRAST  Result Date: 10/23/2021 CLINICAL DATA:  Code stroke.  Stroke suspected EXAM: CT HEAD WITHOUT CONTRAST TECHNIQUE: Contiguous axial images were obtained from the base of the skull through the vertex without intravenous contrast. RADIATION DOSE REDUCTION: This exam was  performed according to the departmental dose-optimization program which includes automated exposure control, adjustment of the mA and/or kV according to patient size and/or use of iterative reconstruction technique. COMPARISON:  12/08/2016 FINDINGS: Brain: No evidence of acute infarction, hemorrhage, cerebral edema, mass, mass effect, or midline shift. Ventricles and sulci are normal for age. No extra-axial fluid collection. Vascular: No hyperdense vessel. Skull: Negative for fracture or focal lesion. Sinuses/Orbits: No acute finding. Other: The mastoid air cells are well aerated. ASPECTS [facility] Stroke Program Early CT Score) - Ganglionic level infarction (caudate, lentiform nuclei, internal capsule, insula, M1-M3 cortex): 7 - Supraganglionic infarction (M4-M6 cortex): 3 Total score (0-10 with 10 being normal): 10 IMPRESSION: 1. No acute intracranial process. 2. ASPECTS is 10 Code stroke imaging results were communicated  on 10/23/2021 at 12:52 pm to provider Dr. Pearlean Brownie via secure text paging. Electronically Signed   By: Wiliam Ke M.D.   On: 10/23/2021 12:53     PHYSICAL EXAM  Temp:  [97.9 F (36.6 C)-99.8 F (37.7 C)] 97.9 F (36.6 C) (06/18 0337) Pulse Rate:  [77-88] 88 (06/18 0337) Resp:  [17-21] 19 (06/18 0337) BP: (114-149)/(68-90) 132/82 (06/18 0337) SpO2:  [96 %-97 %] 96 % (06/18 0337)  General - Well nourished, well developed, in no apparent distress.  Ophthalmologic - fundi not visualized due to noncooperation.  Cardiovascular - Regular rhythm and rate.  Neuro - awake alert, orientated x3, answer questions appropriately, no aphasia, follows simple commands, mild dysarthria.  However left dense hemianopia, able to have bilateral gaze, tracking bilaterally, slight left facial droop, left upper extremity flaccid, left lower extremity 3/5.  Left-sided sensory loss. RUE and RLE 5/5, sensation intact. R FTN intact. Gait not tested   ASSESSMENT/PLAN [name] is a 71 y.o. male with  history of hypertension, glaucoma, gout, TIA in 2018 admitted for left-sided numbness, left hand weakness and left leg gave out.  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   CTA head neck 6/9 showed left ICA 75% stenosis, poorly opacified past the anterior P2 segments and evaluation is significantly complicated by venous contamination. CT perfusion negative.   MRI 6/9 showed right PCA including thalamus small scattered infarcts.   CT repeat 6/12 showed increased size of subacute right PCA infarct.   CT head and neck 6/12 repeat showed right P2 occlusion, left P2 moderate stenosis, left ICA significance and soft plaque but no significant stenosis.   MRI repeat 6/14 showed large complete right PCA infarct including thalamus. EF 60 to 65% TEE no endocarditis Recommend 30-day cardiac event monitoring as outpt rule out A-fib at discharge LDL 52 A1c 5.8 lovenox for VTE prophylaxis aspirin 325 mg daily prior to admission, now on aspirin and Plavix DAPT for 3 months given right P2 occlusion and then plavix alone.  Patient also enrolled to [facility] clinical trial, continue investigational drug. Patient counseled to be compliant with his antithrombotic medications Ongoing aggressive stroke risk factor management Therapy recommendations:  CIR Disposition:  pending  Bacteremia Sepsis/septic shock 6/12 spike WBC to 23.2, now normalized AKI with creatinine 1.5, now normalized Tmax 100.1 -> afebrile Blood culture positive for MRSA, ID consulted, currently on vancomycin, and off Rocephin. ID on board TEE no endocarditis  History of TIA TIA in 2018, MRI negative for acute infarct, carotid Doppler negative.  MRA showed left P2 stenosis.  History of hypertension  Hypotension with septic shock Stable now Long term BP  goal normotensive Avoid low BP  Hyperlipidemia Home meds: Lipitor 40 LDL 52, goal < 70 Now on Lipitor 40 Continue statin at discharge  Other Stroke Risk Factors Advanced age  Other Active Problems gout  Hospital day # 8  Neurology will sign off. Please call with questions. Pt will follow up with Dr. Pearlean Brownie at [facility] in about 4 weeks. Thanks for the consult.  Marvel Plan, MD PhD Stroke Neurology 11/01/2021 10:28 AM    To contact Stroke Continuity provider, please refer to [email or web address]. After hours, contact General Neurology

## 2021-11-02 ENCOUNTER — Encounter (HOSPITAL_COMMUNITY): Payer: Self-pay | Admitting: Cardiology

## 2021-11-02 ENCOUNTER — Other Ambulatory Visit: Payer: Self-pay | Admitting: Cardiology

## 2021-11-02 DIAGNOSIS — I63531 Cerebral infarction due to unspecified occlusion or stenosis of right posterior cerebral artery: Secondary | ICD-10-CM

## 2021-11-02 DIAGNOSIS — I4891 Unspecified atrial fibrillation: Secondary | ICD-10-CM

## 2021-11-02 DIAGNOSIS — I639 Cerebral infarction, unspecified: Secondary | ICD-10-CM

## 2021-11-02 LAB — BASIC METABOLIC PANEL
Anion gap: 8 (ref 5–15)
BUN: 17 mg/dL (ref 8–23)
CO2: 20 mmol/L — ABNORMAL LOW (ref 22–32)
Calcium: 8.6 mg/dL — ABNORMAL LOW (ref 8.9–10.3)
Chloride: 105 mmol/L (ref 98–111)
Creatinine, Ser: 0.77 mg/dL (ref 0.61–1.24)
GFR, Estimated: >60 mL/min (ref >60)
Glucose, Bld: 119 mg/dL — ABNORMAL HIGH (ref 70–99)
Potassium: 3.5 mmol/L (ref 3.5–5.1)
Sodium: 133 mmol/L — ABNORMAL LOW (ref 135–145)

## 2021-11-02 LAB — URINALYSIS, ROUTINE W REFLEX MICROSCOPIC
Bacteria, UA: NONE SEEN
Bilirubin Urine: NEGATIVE
Glucose, UA: NEGATIVE mg/dL
Hgb urine dipstick: NEGATIVE
Ketones, ur: NEGATIVE mg/dL
Leukocytes,Ua: NEGATIVE
Nitrite: NEGATIVE
Protein, ur: 30 mg/dL — AB
Specific Gravity, Urine: 1.03 (ref 1.005–1.030)
pH: 5 (ref 5.0–8.0)

## 2021-11-02 LAB — CBC
HCT: 48.9 % (ref 39.0–52.0)
Hemoglobin: 16.2 g/dL (ref 13.0–17.0)
MCH: 31 pg (ref 26.0–34.0)
MCHC: 33.1 g/dL (ref 30.0–36.0)
MCV: 93.5 fL (ref 80.0–100.0)
Platelets: 258 10*3/uL (ref 150–400)
RBC: 5.23 MIL/uL (ref 4.22–5.81)
RDW: 12.6 % (ref 11.5–15.5)
WBC: 11.9 10*3/uL — ABNORMAL HIGH (ref 4.0–10.5)
nRBC: 0 % (ref 0.0–0.2)

## 2021-11-02 LAB — OSMOLALITY, URINE: Osmolality, Ur: 792 mOsm/kg (ref 300–900)

## 2021-11-02 LAB — SODIUM, URINE, RANDOM: Sodium, Ur: 59 mmol/L

## 2021-11-02 MED ORDER — METHOCARBAMOL 500 MG PO TABS
500.0000 mg | ORAL_TABLET | Freq: Four times a day (QID) | ORAL | Status: DC | PRN
Start: 2021-11-02 — End: 2021-11-05
  Administered 2021-11-02 – 2021-11-05 (×8): 500 mg via ORAL
  Filled 2021-11-02 (×8): qty 1

## 2021-11-02 NOTE — Progress Notes (Signed)
Inpatient Rehabilitation Admissions Coordinator   I spoke again with his girlfriend, [name]. She states she can not care for him at all due to her own physical limitations and is requesting SNF. She states his brothers also prefer SNF. I have updated TOC and will sign off.  Ottie Glazier, RN, MSN Rehab Admissions Coordinator [phone] 11/02/2021 2:42 PM

## 2021-11-02 NOTE — Progress Notes (Signed)
Physical Therapy Treatment Patient Details Name: Roberto [name] MRN: [id number] DOB: 01/05/1951 Today's Date: 11/02/2021   History of Present Illness 71 yo male admitted with L side facial and extremity numbness starting 6/7 MRI(+) multifocal acute ischemia R PCA territory involving dorsal thalmas, medial R temporal lobe, R occipial lobe. Code stroke activated 6/12, MRI revealing Increased size of evolving early subacute right PCA distribution infarct.  PMH CVA 2017 glaucoma, HTN     PT Comments     Pt with dizziness upon sitting up EOB suspect due to where the stroke affected. Pt with L UE flaccidity however demonstrated improved L LE strength when pt given max verbal cues to look at L LE, pt able to participate in L LE exercises. Pt with impaired sensation t/o L LE/UE and neglect limiting ability to use L UE/LE actively/functionally. Pt requiring maxAX2 for all transfers but did tolerate OOB transfer step pvt transfer to recliner. Continue to recommend AIR upon d/c to maximize functional recovery. Acute PT to cont to follow.    Recommendations for follow up therapy are one component of a multi-disciplinary discharge planning process, led by the attending physician.  Recommendations may be updated based on patient status, additional functional criteria and insurance authorization.  Follow Up Recommendations  Acute inpatient rehab (3hours/day)     Assistance Recommended at Discharge Frequent or constant Supervision/Assistance  Patient can return home with the following Two people to help with walking and/or transfers;A little help with bathing/dressing/bathroom;Assistance with cooking/housework;Assist for transportation;Help with stairs or ramp for entrance   Equipment Recommendations   (to be determined)    Recommendations for Other Services Rehab consult     Precautions / Restrictions Precautions Precautions: Fall P Precaution Comments: L UE flaccid, L homohemianopsia, L inattention   Restrictions Weight Bearing Restrictions: No     Mobility  Bed Mobility Overal bed mobility: Needs Assistance Bed Mobility: Sidelying to Sit Rolling: Mod assist (to the L) Sidelying to sit: Max assist, +2 for physical assistance, HOB elevated       General bed mobility comments: max directional verbal and tactile cues to sequence transfer, pt unable to use L UE and LE functionally, maxA for trunk elevation and LE managemnet off EOB    Transfers Overall transfer level: Needs assistance Equipment used: 2 person hand held assist (face to face transfer) Transfers: Sit to/from Stand, Bed to chair/wheelchair/BSC Sit to Stand: Max assist, +2 physical assistance   Step pivot transfers: Max assist, +2 physical assistance       General transfer comment: due to impaired sensation t/o L LE and inattention pt requiring totalA to advance L LE/Block to prevent knee buckling. maxA for weight shifting    Ambulation/Gait               General Gait Details: unable   Stairs             Wheelchair Mobility    Modified Rankin (Stroke Patients Only) Modified Rankin (Stroke Patients Only) Pre-Morbid Rankin Score: No symptoms Modified Rankin: Severe disability     Balance Overall balance assessment: Needs assistance Sitting-balance support: Feet supported, Single extremity supported Sitting balance-Leahy Scale: Poor Sitting balance - Comments: patient has a right gaze preference noted in sitting position with decreased attention to the left. pt with strong L lateral lean requiring max verbal cues to correct   Standing balance support: Single extremity supported Standing balance-Leahy Scale: Poor Standing balance comment: external support required to maintain midline standing balance with standing tolerance of less  than one minute.                            Cognition Arousal/Alertness: Awake/alert (Simultaneous filing. User may not have seen previous  data.) Behavior During Therapy: Flat affect, WFL for tasks assessed/performed (Simultaneous filing. User may not have seen previous data.) Overall Cognitive Status: Impaired/Different from baseline (Simultaneous filing. User may not have seen previous data.) Area of Impairment: Safety/judgement, Problem solving (Simultaneous filing. User may not have seen previous data.)                         Safety/Judgement: Decreased awareness of safety, Decreased awareness of deficits (L inattention  Simultaneous filing. User may not have seen previous data.) Awareness: Intellectual (Simultaneous filing. User may not have seen previous data.) Problem Solving: Slow processing, Requires verbal cues, Requires tactile cues (Simultaneous filing. User may not have seen previous data.) General Comments: Pt with severe L inattention, pt flat affect and easily irritated, especially with spouse however worked well with therapists (Simultaneous filing. User may not have seen previous data.)        Exercises Other Exercises Other Exercises: pt can not feel L LE, once pt would look at L LE pt able to pushing LE into extension from hooklying position against PT resistance x 10 reps Other Exercises: pt unable to grasp concept of L quad set however was able to initiate SLR x 10 reps    General Comments General comments (skin integrity, edema, etc.): dizziness t/o session, suspect due to place of stroke      Pertinent Vitals/Pain Pain Assessment Pain Assessment: Faces (Simultaneous filing. User may not have seen previous data.) Faces Pain Scale: Hurts whole lot (Simultaneous filing. User may not have seen previous data.) Pain Location: L flank (Simultaneous filing. User may not have seen previous data.) Pain Descriptors / Indicators: Moaning (Simultaneous filing. User may not have seen previous data.)    Home Living                          Prior Function            PT Goals (current  goals can now be found in the care plan section) Acute Rehab PT Goals Patient Stated Goal: to walk again PT Goal Formulation: With patient Time For Goal Achievement: 11/07/21 Potential to Achieve Goals: Good Progress towards PT goals: Progressing toward goals    Frequency    Min 4X/week      PT Plan Current plan remains appropriate    Co-evaluation PT/OT/SLP Co-Evaluation/Treatment: Yes Reason for Co-Treatment: Complexity of the patient's impairments (multi-system involvement) (Simultaneous filing. User may not have seen previous data.) PT goals addressed during session: Mobility/safety with mobility OT goals addressed during session: ADL's and self-care;Strengthening/ROM      AM-PAC PT "6 Clicks" Mobility   Outcome Measure  Help needed turning from your back to your side while in a flat bed without using bedrails?: A Lot Help needed moving from lying on your back to sitting on the side of a flat bed without using bedrails?: A Lot Help needed moving to and from a bed to a chair (including a wheelchair)?: A Lot Help needed standing up from a chair using your arms (e.g., wheelchair or bedside chair)?: A Lot Help needed to walk in hospital room?: Total Help needed climbing 3-5 steps with a railing? : Total 6 Click  Score: 10    End of Session Equipment Utilized During Treatment: Gait belt Activity Tolerance: Patient tolerated treatment well Patient left: with call bell/phone within reach;in chair;with chair alarm set;with family/visitor present Nurse Communication: Mobility status PT Visit Diagnosis: Unsteadiness on feet (R26.81);Other abnormalities of gait and mobility (R26.89);Difficulty in walking, not elsewhere classified (R26.2);Other symptoms and signs involving the nervous system (R29.898)     Time: 3235-5732 PT Time Calculation (min) (ACUTE ONLY): 35 min  Charges:  $Neuromuscular Re-education: 8-22 mins                    Lewis Shock, PT, DPT Acute  Rehabilitation Services Secure chat preferred Office #: [phone]    Iona Hansen 11/02/2021, 2:23 PM

## 2021-11-02 NOTE — Progress Notes (Signed)
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 There was concern of erythema/tenderness at the medial right antecubital fossa site previously -the right index finger has a well demarcated pigemented area where previously a spot of contact dermatitis occurred -tee negative for endocarditis -I agree his bacteremia more suspicious to be community onset rather than hospital acquired and likely not checked earlier due to lack of sepsis in setting of advanced age  -he has been complaining of left sided flank pain - midline spine exam 6/19 nontender. Will get mri thoracolumbar area to r/o paraspinous process as complication of bacteremia   Recommendations:  -continue vanc -mri thoracolumbar spine -patient agreeable to mri -duration abx will decide after mri    SUBJECTIVE: Right sided paresis stable -left flank pain ongoing -no f/c -no rash -no hand pain  Review of Systems: Review of Systems  All other systems reviewed and are negative.    Scheduled Meds:  aspirin  81 mg Oral Daily   atorvastatin  80 mg Oral Daily   clopidogrel  75  mg Oral Daily   lidocaine  1 patch Transdermal Q24H   losartan  25 mg Oral Daily   melatonin  3 mg Oral QHS   OCEANIC-STROKE asundexian or placebo  1 tablet Oral Daily   Continuous Infusions:  vancomycin 1,750 mg (11/02/21 1126)   PRN Meds:.acetaminophen, calamine, loperamide, methocarbamol No Known Allergies  OBJECTIVE: Vitals:   11/01/21 1932 11/02/21 0018 11/02/21 0423 11/02/21 0900  BP: 112/70 120/72 126/77 112/74  Pulse: 81 83 78 80  Resp: 19 20 16 15   Temp: 98.8 F (37.1 C) 98 F (36.7 C) 98.2 F (36.8 C) 98.5 F (36.9 C)  TempSrc: Oral Oral Oral Oral  SpO2: 95% 95% 94% 95%  Weight:      Height:       Body mass index is 27.43 kg/m.  Exam: General/constitutional: no distress, pleasant HEENT: Normocephalic, PER, Conj Clear, EOMI, Oropharynx clear Neck supple CV: rrr no mrg Lungs: clear to auscultation, normal respiratory effort Abd: Soft, Nontender Ext: no edema Skin: scabbed over multiple spots on rue; well demarcated hyperpigmented macule on right small finger dorsum without tenderness Neuro: right paraparesis MSK: no peripheral joint swelling/tenderness/warmth; back spines nontender     Lab Results Lab Results  Component Value Date   WBC 11.9 (H) 11/02/2021   HGB 16.2 11/02/2021   HCT 48.9 11/02/2021   MCV 93.5 11/02/2021   PLT 258 11/02/2021    Lab Results  Component Value Date   CREATININE 0.77 11/02/2021   BUN 17 11/02/2021  NA 133 (L) 11/02/2021   K 3.5 11/02/2021   CL 105 11/02/2021   CO2 20 (L) 11/02/2021    Lab Results  Component Value Date   ALT 24 10/27/2021   AST 23 10/27/2021   ALKPHOS 63 10/27/2021   BILITOT 1.4 (H) 10/27/2021     Imaging: Personally reviewed. Finding incorporated into decision making  6/14 mri brain  1. Motion degraded exam. 2. Interval worsening and expansion of previously identified right PCA distribution infarct, now fairly large in and involving the majority of the right PCA distribution. Minimal  associated petechial blood products without frank hemorrhagic transformation. 3. No other new acute intracranial abnormality.   6/16 tee  1. Left ventricular ejection fraction, by estimation, is 60 to 65%. The  left ventricle has normal function.   2. Right ventricular systolic function is normal. The right ventricular  size is normal.   3. No left atrial/left atrial appendage thrombus was detected.   4. The mitral valve is normal in structure. Trivial mitral valve  regurgitation. No evidence of mitral stenosis.   5. The aortic valve is tricuspid. Aortic valve regurgitation is not  visualized. No aortic stenosis is present.   6. There is Moderate (Grade III) plaque involving the descending aorta.   7. Evidence of atrial level shunting detected by color flow Doppler.  Agitated saline contrast bubble study was positive with shunting observed  within 3-6 cardiac cycles suggestive of interatrial shunt.   Conclusion(s)/Recommendation(s): No evidence of vegetation/infective  endocarditis on this transesophageael echocardiogram. 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   Raymondo Band, MD [facility] 11/02/2021, 2:28 PM

## 2021-11-02 NOTE — Progress Notes (Addendum)
Clinical update:  Paged by RN at 2300 regarding new onset left sided chest pain for this patient. Reportedly, the patient told the RN that he has been having a dull, left sided chest pain for 2 hours. He also describes having pain in his left arm and left leg, although this seems to have been ongoing since he had his stroke.   Evaluated the patient at the bedside and he describes that his chest pain is dull in nature and started a couple hours ago. He has not had a chest pain like this before. Some PVCs were noted on the telemetry monitor. He also states that he feels weaker in his LUE, however, per previous notes, the patient has been documented to have a flaccid LUE, which is consistent with my exam now.   Physical exam:  General: Resting comfortably in bed, no acute distress Neuro exam: Awake, alert, and oriented x3. No aphasia. Follows commands.  Motor: RUE and RLE strength 5/5, LUE 0/5, LLE 3/5.  Sensation: normal in RUE and RLE; no sensation in LUE and LLE. Cranial nerves: CN intact aside from patient not being able to track eyes to the left.  CV: Regular rate and rhythm.    Of note, this neuro exam is consistent with neurology's neuro exam previously and there are no new findings at this time. Low suspicion for new infarct.  Plan: - EKG and troponins ordered to evaluate chest pain   Takeisha Cianci Ned Card, DO Internal Medicine Resident, PGY-1 On call pager: [phone]

## 2021-11-02 NOTE — Progress Notes (Signed)
Occupational Therapy Treatment Patient Details Name: Roberto [name] MRN: [id number] DOB: Oct 16, 1950 Today's Date: 11/02/2021   History of present illness 71 yo male admitted with L side facial and extremity numbness starting 6/7 MRI(+) multifocal acute ischemia R PCA territory involving dorsal thalmas, medial R temporal lobe, R occipial lobe. Code stroke activated 6/12, MRI revealing Increased size of evolving early subacute right PCA distribution infarct.  PMH CVA 2017 glaucoma, HTN   OT comments  Pt progressing towards goals, requiring max A for UB ADLs, mod A for facilitation of LUE WB sitting EOB. Pt mod A +2 for bed mobility, max A +2 for step pivot transfer with L knee block. Pt with continued R gaze/L inattention, minimal movement noted in L hand (4th and 5th digits) when asked to grasp hand. PROM of LUE WFL. Pt reporting dizziness throughout, however VSS on RA. Pt presenting with impairments listed below, will continue to follow. Noted AIR signing off due to decreased home support, updating d/c recommendation to SNF at this time.   Recommendations for follow up therapy are one component of a multi-disciplinary discharge planning process, led by the attending physician.  Recommendations may be updated based on patient status, additional functional criteria and insurance authorization.    Follow Up Recommendations  Skilled nursing-short term rehab (<3 hours/day)    Assistance Recommended at Discharge Frequent or constant Supervision/Assistance  Patient can return home with the following  Two people to help with walking and/or transfers;A lot of help with bathing/dressing/bathroom;Assist for transportation;Assistance with feeding;Assistance with cooking/housework;Direct supervision/assist for medications management;Direct supervision/assist for financial management   Equipment Recommendations  Other (comment);None recommended by OT (defer to next venue of care)    Recommendations for Other  Services Rehab consult    Precautions / Restrictions Precautions Precautions: Fall Precaution Comments: L UE flaccid, L homohemianopsia, L inattention Restrictions Weight Bearing Restrictions: No       Mobility Bed Mobility Overal bed mobility: Needs Assistance Bed Mobility: Sidelying to Sit Rolling: Mod assist, +2 for physical assistance   Supine to sit: Mod assist, +2 for physical assistance     General bed mobility comments: able to scoot legs toward EOB, can reach across with RUE, assistance to scoot to EOB with pad    Transfers Overall transfer level: Needs assistance Equipment used: 2 person hand held assist Transfers: Sit to/from Stand, Bed to chair/wheelchair/BSC Sit to Stand: Max assist, +2 physical assistance     Step pivot transfers: Max assist, +2 physical assistance     General transfer comment: due to impaired sensation t/o L LE and inattention pt requiring totalA to advance L LE/Block to prevent knee buckling. maxA for weight shifting     Balance Overall balance assessment: Needs assistance Sitting-balance support: Feet supported, Single extremity supported Sitting balance-Leahy Scale: Poor Sitting balance - Comments: patient has a right gaze preference noted in sitting position with decreased attention to the left. pt with strong L lateral lean requiring max verbal cues to correct   Standing balance support: Single extremity supported Standing balance-Leahy Scale: Poor Standing balance comment: external support required to maintain midline standing balance with standing tolerance of less than one minute.                           ADL either performed or assessed with clinical judgement   ADL Overall ADL's : Needs assistance/impaired                 Upper  Body Dressing : Maximal assistance;Sitting Upper Body Dressing Details (indicate cue type and reason): to don gown with compensatory strategy     Toilet Transfer: Maximal  assistance;Total assistance;+2 for physical assistance;BSC/3in1;Stand-pivot Toilet Transfer Details (indicate cue type and reason): L knee block                Extremity/Trunk Assessment Upper Extremity Assessment Upper Extremity Assessment: LUE deficits/detail LUE Deficits / Details: no active movement in L shoulder, elbow/ wrist, minimal movement in digits 4 & 5 when asked to grasp LUE Sensation: decreased light touch;decreased proprioception LUE Coordination: decreased fine motor;decreased gross motor   Lower Extremity Assessment Lower Extremity Assessment: Defer to PT evaluation        Vision   Vision Assessment?: Vision impaired- to be further tested in functional context Additional Comments: L homonymous hemianopsia, R gaze with L inattention. Needs repeated cues to attend to LUE during exercises.   Perception Perception Perception: Impaired   Praxis Praxis Praxis: Impaired Praxis Impairment Details: Ideation;Initiation;Ideomotor    Cognition Arousal/Alertness: Awake/alert Behavior During Therapy: Flat affect, WFL for tasks assessed/performed Overall Cognitive Status: Impaired/Different from baseline Area of Impairment: Safety/judgement, Problem solving                         Safety/Judgement: Decreased awareness of safety, Decreased awareness of deficits Awareness: Intellectual Problem Solving: Slow processing, Requires verbal cues, Requires tactile cues General Comments: decreased response time, single word responses, irritated with spouse at times during session        Exercises      Shoulder Instructions       General Comments dizziness reported with sitting upright, BP WNL    Pertinent Vitals/ Pain       Pain Assessment Pain Assessment: Faces Pain Score: 4  Faces Pain Scale: Hurts little more Pain Location: Low back Pain Descriptors / Indicators: Moaning Pain Intervention(s): Limited activity within patient's tolerance, Monitored  during session, Repositioned  Home Living                                          Prior Functioning/Environment              Frequency  Min 2X/week        Progress Toward Goals  OT Goals(current goals can now be found in the care plan section)  Progress towards OT goals: Progressing toward goals  Acute Rehab OT Goals Patient Stated Goal: to get better OT Goal Formulation: With patient Time For Goal Achievement: 11/07/21 Potential to Achieve Goals: Good ADL Goals Pt Will Perform Eating: with min guard assist;sitting;with adaptive utensils Pt Will Perform Grooming: with min guard assist;sitting;with adaptive equipment Pt Will Transfer to Toilet: with min assist;ambulating;bedside commode Additional ADL Goal #1: pt will locate L UE with R UE and position Mod I prior to any transfer attempts Additional ADL Goal #2: pt will complete HEP L UE with Min (A) written directions with girlfriend  Plan Frequency remains appropriate;Discharge plan needs to be updated    Co-evaluation    PT/OT/SLP Co-Evaluation/Treatment: Yes Reason for Co-Treatment: Complexity of the patient's impairments (multi-system involvement);To address functional/ADL transfers;For patient/therapist safety PT goals addressed during session: Mobility/safety with mobility OT goals addressed during session: ADL's and self-care;Strengthening/ROM      AM-PAC OT "6 Clicks" Daily Activity     Outcome Measure   Help from another  person eating meals?: A Lot Help from another person taking care of personal grooming?: A Lot Help from another person toileting, which includes using toliet, bedpan, or urinal?: Total Help from another person bathing (including washing, rinsing, drying)?: A Lot Help from another person to put on and taking off regular upper body clothing?: A Lot Help from another person to put on and taking off regular lower body clothing?: Total 6 Click Score: 10    End of  Session Equipment Utilized During Treatment: Gait belt  OT Visit Diagnosis: Unsteadiness on feet (R26.81);Muscle weakness (generalized) (M62.81);Low vision, both eyes (H54.2)   Activity Tolerance Patient tolerated treatment well   Patient Left in chair;with call bell/phone within reach;with chair alarm set   Nurse Communication Mobility status;Need for lift equipment (possible need for Stedy to return to bed)        Time: [id number] OT Time Calculation (min): 36 min  Charges: OT General Charges $OT Visit: 1 Visit OT Treatments $Self Care/Home Management : 8-22 mins  Lynnda Child, OTD, OTR/L Acute Rehab [phone] - [place] 11/02/2021, 4:36 PM

## 2021-11-02 NOTE — Progress Notes (Signed)
HD#9 SUBJECTIVE:  Patient Summary: Roberto [name] is a 71 y.o. with a pertinent PMH of CVA in 2017, who presented with L sided numbness and admitted for R PCA infarct now complicated by MRSA and strep pyogenes bacteremia.  Overnight Events: Patient had diarrhea overnight.   Interim History: Patient has persistent L flank pain, not alleviated by lidocaine patch. He has no other complaints at this time.  OBJECTIVE:  Vital Signs: Vitals:   11/01/21 1932 11/02/21 0018 11/02/21 0423 11/02/21 0900  BP: 112/70 120/72 126/77 112/74  Pulse: 81 83 78 80  Resp: 19 20 16 15   Temp: 98.8 F (37.1 C) 98 F (36.7 C) 98.2 F (36.8 C) 98.5 F (36.9 C)  TempSrc: Oral Oral Oral Oral  SpO2: 95% 95% 94% 95%  Weight:      Height:       Supplemental O2: Room Air SpO2: 95 % O2 Flow Rate (L/min): 2 L/min  Filed Weights   10/23/21 1200 10/24/21 0316  Weight: 81.6 kg 77.1 kg     Intake/Output Summary (Last 24 hours) at 11/02/2021 1120 Last data filed at 11/02/2021 0427 Gross per 24 hour  Intake 0 ml  Output 1000 ml  Net -1000 ml    Net IO Since Admission: -3,172.39 mL [11/02/21 1120]  Physical Exam: Constitutional:Resting comfortably in bed in no acute distress. Cardio:Regular rate and rhythm. No murmurs, rubs, gallops. Pulm:Normal work of breathing on room air. Skin:Skin is warm and dry.  Neuro: Mental Status: Patient is awake, alert, oriented x3 No signs of aphasia or neglect. Cranial Nerves: III,IV, VI: Does not track to L. Endorses continued diminished vision of L visual field. VIII: Hearing is intact to voice Motor: RUE and RLE 5/5, LUE 2-3/5, LLE 4/5. Sensory: Sensation is normal on RUE and RLE, diminished on LUE and LLE. Psych: Pleasant mood and affect.  Patient Lines/Drains/Airways Status     Active Line/Drains/Airways     Name Placement date Placement time Site Days   Peripheral IV 10/23/21 20 G Anterior;Distal;Left;Upper Arm 10/23/21  --  Arm  5   External Urinary  Catheter 10/26/21  --  --  2             ASSESSMENT/PLAN:  Assessment: Principal Problem:   CVA (cerebral vascular accident) (HCC) Active Problems:   Essential hypertension   Aspiration pneumonia (HCC)   Erythrocytosis   Hyponatremia   Acute kidney injury (HCC)   Bacteremia   Plan: #MRSA and streptococcus pyogenes bacteremia Remains afebrile, hemodynamically stable. New mild leukocytosis stable at 11.9. Repeat blood culture shows NGTD.  -ID following, appreciate their assistance -Continue vancomycin -F/u repeat blood culture -Trend CBC, fever curve  #L flank pain Patient initially had complaint of back pain 06/17, now subjectively worse both at rest and with any attempts at movement. UA 06/18 unremarkable.  -Will repeat UA -Robaxin 500 mg q6h PRN muscle spasms, flank pain -Lidocaine patch  #R PCA Infarct Neurological exam is stable. Again, I believe CIR is his best bet for optimal recovery. -Neurology following, appreciate their assistance             -[place] Stroke Study -SCDs only for VTE prophylaxis -Continue atorvastatin 80 mg daily -Continue ASA 81 mg daily x 3 weeks -Continue Plavix 75 mg daily -Lifestyle modifications for pre-diabetes and hypertriglyceridemia  -F/u CIR decision  #Acute stress reaction Mood is much improved over the last several days. I am hopeful that this will continue with further encouragement and progress. -CTM. Will plan to  recommend IBH referral on discharge for ongoing psychological support.   #Erythrocytosis Hgb 16.3. -F/u JAK2 -Trend CBC   #Hypertension Normotensive. Home losartan restarted at half dose, 25 mg daily, 06/17. -Continue losartan at 25 mg daily  #NAGMA #Hyponatremia Unchanged. Urine studies reveal elevated urine Na at 59, elevated urine osm at 792. 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 -Trend BMP  Best Practice: Diet: Regular IVF: None VTE: Place and maintain sequential compression device Start:  10/26/21 1121 Code: Full AB: Vancomycin Therapy Recs: CIR Family Contact: Roberto [name] Mining engineer) and Roberto [name] (partner), called and notified. DISPO: Anticipated discharge pending Medical stability and approval for CIR/insurance auth .  Signature: Champ Mungo, D.O.  Internal Medicine Resident, PGY-1 [facility]  Pager: [phone] 11:20 AM, 11/02/2021   Please contact the on call pager after 5 pm and on weekends at [phone].

## 2021-11-02 NOTE — TOC Initial Note (Signed)
Transition of Care [facility]) - Initial/Assessment Note    Patient Details  Name: Roberto [name] MRN: [id number] Date of Birth: 1951/03/22  Transition [facility]) CM/SW Contact:    Baldemar Lenis, LCSW Phone Number: 11/02/2021, 4:15 PM  Clinical Narrative:            CSW alerted by Rehab Admissions that patient will need SNF. CSW had previously faxed out referral. Patient had one bed offer at [facility], CSW asked them to initiate insurance authorization. [facility] cannot accept [employer], thought they could admit patient under Medicare. CSW explained that the patient's Medicare is secondary, not primary. [facility] unable to accept. Patient has no other bed offer at this time. CSW notified TOC Leadership to ask for assistance in locating SNF for patient as no SNF is in network.       Expected Discharge Plan: Skilled Nursing Facility Barriers to Discharge: Inadequate or no insurance, English as a second language teacher, Continued Medical Work up   Patient Goals and CMS Choice Patient states their goals for this hospitalization and ongoing recovery are:: get home CMS Medicare.gov Compare Post Acute Care list provided to:: Patient Choice offered to / list presented to : Patient  Expected Discharge Plan and Services Expected Discharge Plan: Skilled Nursing Facility     Post Acute Care Choice: Skilled Nursing Facility Living arrangements for the past 2 months: Single Family Home                                      Prior Living Arrangements/Services Living arrangements for the past 2 months: Single Family Home Lives with:: Significant Other Patient language and need for interpreter reviewed:: No Do you feel safe going back to the place where you live?: Yes      Need for Family Participation in Patient Care: No (Comment) Care giver support system in place?: No (comment)   Criminal Activity/Legal Involvement Pertinent to Current Situation/Hospitalization: No - Comment as  needed  Activities of Daily Living      Permission Sought/Granted Permission sought to share information with : Facility Medical sales representative, Family Supports Permission granted to share information with : Yes, Verbal Permission Granted  Share Information with NAME: [name]  Permission granted to share info w AGENCY: SNF  Permission granted to share info w Relationship: Significant other     Emotional Assessment Appearance:: Appears stated age Attitude/Demeanor/Rapport: Engaged Affect (typically observed): Appropriate Orientation: : Oriented to Self, Oriented to Place, Oriented to  Time, Oriented to Situation Alcohol / Substance Use: Not Applicable Psych Involvement: No (comment)  Admission diagnosis:  Suspected cerebrovascular accident (CVA) [R09.89] Cerebrovascular accident (CVA), unspecified mechanism (HCC) [I63.9] CVA (cerebral vascular accident) [facility]) [I63.9] Patient Active Problem List   Diagnosis Date Noted   Bacteremia    Aspiration pneumonia (HCC) 10/27/2021   Erythrocytosis 10/27/2021   Hyponatremia 10/27/2021   Acute kidney injury (HCC) 10/27/2021   CVA (cerebral vascular accident) (HCC) 10/24/2021   Gout 12/09/2016   Essential hypertension 12/09/2016   Numbness and tingling of left arm and leg    TIA (transient ischemic attack) 12/08/2016   Rosacea 12/31/2013   PCP:  Patient, No Pcp Per Pharmacy:   [id number] - [place] - [address] [place]) [place] [address] Phone: [phone] Fax: [phone]     Social Determinants of Health (SDOH) Interventions    Readmission Risk Interventions  No data to display

## 2021-11-03 ENCOUNTER — Inpatient Hospital Stay (HOSPITAL_COMMUNITY): Payer: PRIVATE HEALTH INSURANCE

## 2021-11-03 DIAGNOSIS — I63531 Cerebral infarction due to unspecified occlusion or stenosis of right posterior cerebral artery: Secondary | ICD-10-CM | POA: Diagnosis not present

## 2021-11-03 LAB — CULTURE, BLOOD (ROUTINE X 2)
Culture: NO GROWTH
Culture: NO GROWTH
Special Requests: ADEQUATE
Special Requests: ADEQUATE

## 2021-11-03 LAB — BASIC METABOLIC PANEL
Anion gap: 12 (ref 5–15)
BUN: 19 mg/dL (ref 8–23)
CO2: 18 mmol/L — ABNORMAL LOW (ref 22–32)
Calcium: 8.7 mg/dL — ABNORMAL LOW (ref 8.9–10.3)
Chloride: 102 mmol/L (ref 98–111)
Creatinine, Ser: 0.82 mg/dL (ref 0.61–1.24)
GFR, Estimated: >60 mL/min (ref >60)
Glucose, Bld: 102 mg/dL — ABNORMAL HIGH (ref 70–99)
Potassium: 3.8 mmol/L (ref 3.5–5.1)
Sodium: 132 mmol/L — ABNORMAL LOW (ref 135–145)

## 2021-11-03 LAB — NGS JAK2 EXONS 12-15

## 2021-11-03 LAB — URINALYSIS, ROUTINE W REFLEX MICROSCOPIC
Bilirubin Urine: NEGATIVE
Glucose, UA: NEGATIVE mg/dL
Hgb urine dipstick: NEGATIVE
Ketones, ur: 5 mg/dL — AB
Leukocytes,Ua: NEGATIVE
Nitrite: NEGATIVE
Protein, ur: NEGATIVE mg/dL
Specific Gravity, Urine: 1.03 (ref 1.005–1.030)
pH: 5 (ref 5.0–8.0)

## 2021-11-03 LAB — CBC
HCT: 46.9 % (ref 39.0–52.0)
Hemoglobin: 16.5 g/dL (ref 13.0–17.0)
MCH: 32.5 pg (ref 26.0–34.0)
MCHC: 35.2 g/dL (ref 30.0–36.0)
MCV: 92.5 fL (ref 80.0–100.0)
Platelets: 280 10*3/uL (ref 150–400)
RBC: 5.07 MIL/uL (ref 4.22–5.81)
RDW: 12.7 % (ref 11.5–15.5)
WBC: 10.8 10*3/uL — ABNORMAL HIGH (ref 4.0–10.5)
nRBC: 0 % (ref 0.0–0.2)

## 2021-11-03 LAB — TROPONIN I (HIGH SENSITIVITY): Troponin I (High Sensitivity): 4 ng/L (ref <18)

## 2021-11-03 MED ORDER — GADOBUTROL 1 MMOL/ML IV SOLN
7.5000 mL | Freq: Once | INTRAVENOUS | Status: AC | PRN
  Administered 2021-11-03: 7.5 mL via INTRAVENOUS

## 2021-11-03 NOTE — Plan of Care (Signed)
?  Problem: Education: ?Goal: Knowledge of General Education information will improve ?Description: Including pain rating scale, medication(s)/side effects and non-pharmacologic comfort measures ?Outcome: Progressing ?  ?Problem: Health Behavior/Discharge Planning: ?Goal: Ability to manage health-related needs will improve ?Outcome: Progressing ?  ?Problem: Clinical Measurements: ?Goal: Ability to maintain clinical measurements within normal limits will improve ?Outcome: Progressing ?Goal: Will remain free from infection ?Outcome: Progressing ?Goal: Diagnostic test results will improve ?Outcome: Progressing ?Goal: Respiratory complications will improve ?Outcome: Progressing ?Goal: Cardiovascular complication will be avoided ?Outcome: Progressing ?  ?Problem: Activity: ?Goal: Risk for activity intolerance will decrease ?Outcome: Progressing ?  ?Problem: Nutrition: ?Goal: Adequate nutrition will be maintained ?Outcome: Progressing ?  ?Problem: Coping: ?Goal: Level of anxiety will decrease ?Outcome: Progressing ?  ?Problem: Elimination: ?Goal: Will not experience complications related to bowel motility ?Outcome: Progressing ?Goal: Will not experience complications related to urinary retention ?Outcome: Progressing ?  ?Problem: Pain Managment: ?Goal: General experience of comfort will improve ?Outcome: Progressing ?  ?Problem: Safety: ?Goal: Ability to remain free from injury will improve ?Outcome: Progressing ?  ?Problem: Skin Integrity: ?Goal: Risk for impaired skin integrity will decrease ?Outcome: Progressing ?  ?Problem: Education: ?Goal: Knowledge of disease or condition will improve ?Outcome: Progressing ?Goal: Knowledge of secondary prevention will improve (SELECT ALL) ?Outcome: Progressing ?Goal: Knowledge of patient specific risk factors will improve (INDIVIDUALIZE FOR PATIENT) ?Outcome: Progressing ?Goal: Individualized Educational Video(s) ?Outcome: Progressing ?  ?Problem: Coping: ?Goal: Will verbalize  positive feelings about self ?Outcome: Progressing ?Goal: Will identify appropriate support needs ?Outcome: Progressing ?  ?Problem: Health Behavior/Discharge Planning: ?Goal: Ability to manage health-related needs will improve ?Outcome: Progressing ?  ?Problem: Self-Care: ?Goal: Ability to participate in self-care as condition permits will improve ?Outcome: Progressing ?Goal: Ability to communicate needs accurately will improve ?Outcome: Progressing ?  ?Problem: Ischemic Stroke/TIA Tissue Perfusion: ?Goal: Complications of ischemic stroke/TIA will be minimized ?Outcome: Progressing ?  ?

## 2021-11-03 NOTE — Progress Notes (Signed)
Physical Therapy Treatment Patient Details Name: Roberto [name] MRN: [id number] DOB: 05/02/1951 Today's Date: 11/03/2021   History of Present Illness 71 yo male admitted with L side facial and extremity numbness starting 6/7 MRI(+) multifocal acute ischemia R PCA territory involving dorsal thalmas, medial R temporal lobe, R occipial lobe. Code stroke activated 6/12, MRI revealing Increased size of evolving early subacute right PCA distribution infarct.  PMH CVA 2017 glaucoma, HTN    PT Comments    Pt progressing well towards all goals. Pt with improved L LE strength, sitting EOB balance without support, standing tolerance and ability to participate in standing exercises. Treatment session limited today by transportation coming to pick up patient for MRI. Continue to recommend AIR upon d/c for maximal functional recovery. Acute PT to cont to follow.    Recommendations for follow up therapy are one component of a multi-disciplinary discharge planning process, led by the attending physician.  Recommendations may be updated based on patient status, additional functional criteria and insurance authorization.  Follow Up Recommendations  Acute inpatient rehab (3hours/day)     Assistance Recommended at Discharge Frequent or constant Supervision/Assistance  Patient can return home with the following Two people to help with walking and/or transfers;A little help with bathing/dressing/bathroom;Assistance with cooking/housework;Assist for transportation;Help with stairs or ramp for entrance   Equipment Recommendations   (to be determined)    Recommendations for Other Services Rehab consult     Precautions / Restrictions Precautions Precautions: Fall Precaution Comments: L UE flaccid, L homohemianopsia, L inattention Restrictions Weight Bearing Restrictions: No     Mobility  Bed Mobility Overal bed mobility: Needs Assistance Bed Mobility: Sidelying to Sit, Rolling, Sit to Supine Rolling: Mod  assist (with max directional cues) Sidelying to sit: HOB elevated, Mod assist   Sit to supine: Mod assist   General bed mobility comments: pt required max directional verbal and tactile cues to roll L and complete transfer up to EOB, pt with noted L impaired vision, pt with good command follow and effort once given cues, pt unable to complete without cues    Transfers Overall transfer level: Needs assistance Equipment used: 1 person hand held assist Transfers: Sit to/from Stand Sit to Stand: Max assist           General transfer comment: pt's first stand was modAX2 however unable to maintain upright standing despite verbal cues and tactile cues to straighten L knee. pt then given a chair in front of him to hold onto in which he did much better resulting in modAx1 to power up, maxA to maintain upright posture and weight shift to the L to complete R LE marches to promoted L LE mm contraction    Ambulation/Gait                   Stairs             Wheelchair Mobility    Modified Rankin (Stroke Patients Only) Modified Rankin (Stroke Patients Only) Pre-Morbid Rankin Score: No symptoms Modified Rankin: Severe disability     Balance Overall balance assessment: Needs assistance Sitting-balance support: Feet supported, Single extremity supported Sitting balance-Leahy Scale: Poor Sitting balance - Comments: pt able to maintain EOB balance with close min guard assist today and support of R UE, pt without L lateral lean   Standing balance support: Single extremity supported Standing balance-Leahy Scale: Poor Standing balance comment: external support required to maintain midline standing balance with standing tolerance of less than one minute.  Cognition Arousal/Alertness: Awake/alert Behavior During Therapy: Flat affect, WFL for tasks assessed/performed Overall Cognitive Status: Impaired/Different from baseline Area of  Impairment: Safety/judgement, Problem solving, Awareness                         Safety/Judgement: Decreased awareness of safety, Decreased awareness of deficits (L neglect) Awareness: Intellectual Problem Solving: Slow processing, Requires verbal cues, Requires tactile cues, Difficulty sequencing General Comments: pt reports talking to his brothers in [place] and they will help him after rehab however unsure of the accuracy and the patients throught process on how he would get to [place]        Exercises Other Exercises Other Exercises: worked on [employer] in standing x 10 reps, x2 sets    General Comments General comments (skin integrity, edema, etc.): pt reports less dizziness today      Pertinent Vitals/Pain Pain Assessment Pain Assessment: Faces Faces Pain Scale: Hurts little more Pain Location: Low back, L flank Pain Descriptors / Indicators: Moaning Pain Intervention(s): Monitored during session    Home Living                          Prior Function            PT Goals (current goals can now be found in the care plan section) Acute Rehab PT Goals Patient Stated Goal: to go home and take a hot bath PT Goal Formulation: With patient Time For Goal Achievement: 11/07/21 Potential to Achieve Goals: Good Progress towards PT goals: Progressing toward goals    Frequency    Min 4X/week      PT Plan Current plan remains appropriate    Co-evaluation              AM-PAC PT "6 Clicks" Mobility   Outcome Measure  Help needed turning from your back to your side while in a flat bed without using bedrails?: A Lot Help needed moving from lying on your back to sitting on the side of a flat bed without using bedrails?: A Lot Help needed moving to and from a bed to a chair (including a wheelchair)?: A Lot Help needed standing up from a chair using your arms (e.g., wheelchair or bedside chair)?: A Lot Help needed to walk in hospital room?:  Total Help needed climbing 3-5 steps with a railing? : Total 6 Click Score: 10    End of Session Equipment Utilized During Treatment: Gait belt Activity Tolerance: Patient tolerated treatment well Patient left: with call bell/phone within reach;in bed (transportation present to take pt for MRI) Nurse Communication: Mobility status PT Visit Diagnosis: Unsteadiness on feet (R26.81);Other abnormalities of gait and mobility (R26.89);Difficulty in walking, not elsewhere classified (R26.2);Other symptoms and signs involving the nervous system (R29.898)     Time: 7353-2992 PT Time Calculation (min) (ACUTE ONLY): 21 min  Charges:  $Neuromuscular Re-education: 8-22 mins                     Lewis Shock, PT, DPT Acute Rehabilitation Services Secure chat preferred Office #: [phone]    Iona Hansen 11/03/2021, 12:47 PM

## 2021-11-03 NOTE — Progress Notes (Signed)
Inpatient Rehabilitation Admissions Coordinator   Per Dr Diamantina Providence request, I contacted patient's brothers, [place]etta [place] in [place] and [place] in [place]. They are unable to offer any caregivers supports for Roberto [name] at this time as they are 53 and 71 years old. They have not seen him since 2002. They wish for SNF. I have updated TOC and Dr August Saucer as well as [name]. We will not pursue CIR admit.  Ottie Glazier, RN, MSN Rehab Admissions Coordinator [phone] 11/03/2021 3:16 PM

## 2021-11-03 NOTE — Progress Notes (Signed)
Pt states that he is having left sided chest pain that he describes as numb. Stated that it started 2 hours ago but did not tell anyone. States that left arm and left leg are also in a lot of pain. Vitals are stable BP: 130/56, HR-82. RR 17, 02 sat 92% on room air . Temp 98.8. Provider Rayann Atway notofied. Presented to the bedside. See new orders.

## 2021-11-03 NOTE — Progress Notes (Addendum)
HD#10 SUBJECTIVE:  Patient Summary: Roberto [name] is a 71 y.o. with a pertinent PMH of CVA in 2017, who presented with L sided numbness and admitted for R PCA infarct now complicated by MRSA and strep pyogenes bacteremia.  Overnight Events: Patient had chest pain overnight. Troponin was flat and EKG did not show significant changes from prior.  Interim History: Patient overall is doing well this morning. He is concerned that he has not yet been able to leave to go to rehab. I explained that because there is concern that he won't have additional care assistance at home after rehab, we are looking into outpatient facilities. He tells me that he spoke with two of his brothers in [place] and has developed a plan to go to [place] and live with them after discharge, and that we have permission to reach out to them.  OBJECTIVE:  Vital Signs: Vitals:   11/03/21 0111 11/03/21 0400 11/03/21 0741 11/03/21 1232  BP: (!) 120/92 (!) 126/54  (!) 133/96  Pulse: 85 80    Resp: 20 17    Temp: 98.4 F (36.9 C) 98.9 F (37.2 C) 99.1 F (37.3 C) 98.3 F (36.8 C)  TempSrc: Oral Oral Oral Oral  SpO2: 91% 95%  92%  Weight:      Height:       Supplemental O2: Room Air SpO2: 92 % O2 Flow Rate (L/min): 2 L/min  Filed Weights   10/23/21 1200 10/24/21 0316  Weight: 81.6 kg 77.1 kg     Intake/Output Summary (Last 24 hours) at 11/03/2021 1317 Last data filed at 11/03/2021 1300 Gross per 24 hour  Intake 360 ml  Output 800 ml  Net -440 ml    Net IO Since Admission: -3,612.39 mL [11/03/21 1317]  Physical Exam: Constitutional:Resting comfortably in bed in no acute distress. Cardio:Regular rate and rhythm. No murmurs, rubs, gallops. Pulm:Normal work of breathing on room air. Skin:Skin is warm and dry.  Neuro: Mental Status: Patient is awake, alert, oriented x3 No signs of aphasia or neglect. Cranial Nerves: III,IV, VI: Does not track to L. Endorses continued diminished vision of L visual  field. VIII: Hearing is intact to voice Motor: RUE and RLE 5/5, LUE 2/5, LLE 4/5. Sensory: Sensation is normal on RUE and RLE, diminished on LLE, and not reported on LUE. Psych: Pleasant mood and affect.  Patient Lines/Drains/Airways Status     Active Line/Drains/Airways     Name Placement date Placement time Site Days   Peripheral IV 10/23/21 20 G Anterior;Distal;Left;Upper Arm 10/23/21  --  Arm  5   External Urinary Catheter 10/26/21  --  --  2             ASSESSMENT/PLAN:  Assessment: Principal Problem:   CVA (cerebral vascular accident) (HCC) Active Problems:   Essential hypertension   Aspiration pneumonia (HCC)   Erythrocytosis   Hyponatremia   Acute kidney injury (HCC)   Bacteremia   Plan: #MRSA and streptococcus pyogenes bacteremia Suspected source is skin from recent contact dermatitis vs. Otogenic from poor dentition vs. Old iv site. Remains afebrile, hemodynamically stable. New mild leukocytosis improved to 10.8. Repeat blood culture shows NGTD. TEE was negative for endocarditis.  -ID following, appreciate their assistance -Continue vancomycin -F/u repeat blood culture -Trend CBC, fever curve  #L flank pain Patient initially had complaint of back pain 06/17, now subjectively worse both at rest and with any attempts at movement. UA 06/18 unremarkable, repeat UA 06/20 significant only for proteinuria. He was given  robaxin to mitigate his pain 06/19 and does not complain of pain today. MRI thoracic and lumbar spine were obtained and showed no acute abnormality with chronic changes to vertebrae. -Consider orthopedics f/u as outpatient -Robaxin 500 mg q6h PRN muscle spasms, flank pain -Lidocaine patch  #R PCA Infarct Neurological exam is stable. Again, I believe CIR is his best bet for optimal recovery. I have reached out to CIR staff regarding news about dispo planning that I received today so that we can investigate this further. -Neurology following,  appreciate their assistance             -[facility] Stroke Study -SCDs only for VTE prophylaxis -Continue atorvastatin 80 mg daily -Continue ASA 81 mg daily x 3 weeks -Continue Plavix 75 mg daily -Lifestyle modifications for pre-diabetes and hypertriglyceridemia  -F/u CIR decision  #Acute stress reaction Mood is much improved over the last several days. I am hopeful that this will continue with further encouragement and progress. -CTM. Will plan to recommend IBH referral on discharge for ongoing psychological support.   #Erythrocytosis Stable. -F/u JAK2 -Trend CBC   #Hypertension Normotensive. Home losartan restarted at half dose, 25 mg daily, 06/17. -Continue losartan at 25 mg daily  #NAGMA #Hyponatremia Unchanged. Urine studies reveal elevated urine Na at 59, elevated urine osm at 792. 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 -Trend BMP  Best Practice: Diet: Regular IVF: None VTE: Place and maintain sequential compression device Start: 10/26/21 1121 Code: Full AB: Vancomycin Therapy Recs: CIR Family Contact: [name] and [name] (partner), called and notified. DISPO: Anticipated discharge pending Medical stability and approval for CIR/insurance auth .  Signature: Champ Mungo, D.O.  Internal Medicine Resident, PGY-1 [facility]  Pager: [phone] 1:17 PM, 11/03/2021   Please contact the on call pager after 5 pm and on weekends at [phone].

## 2021-11-03 NOTE — Progress Notes (Signed)
Pt resting comfortably , states that pain has resolved . MRI called to get pt for MRI of spine, pt states that he will like to wait until the morning, provider notified.

## 2021-11-04 DIAGNOSIS — I63531 Cerebral infarction due to unspecified occlusion or stenosis of right posterior cerebral artery: Secondary | ICD-10-CM | POA: Diagnosis not present

## 2021-11-04 LAB — VANCOMYCIN, PEAK: Vancomycin Pk: >60 ug/mL (ref 30–40)

## 2021-11-04 LAB — BASIC METABOLIC PANEL
Anion gap: 9 (ref 5–15)
BUN: 21 mg/dL (ref 8–23)
CO2: 18 mmol/L — ABNORMAL LOW (ref 22–32)
Calcium: 8.8 mg/dL — ABNORMAL LOW (ref 8.9–10.3)
Chloride: 106 mmol/L (ref 98–111)
Creatinine, Ser: 0.83 mg/dL (ref 0.61–1.24)
GFR, Estimated: >60 mL/min (ref >60)
Glucose, Bld: 90 mg/dL (ref 70–99)
Potassium: 3.9 mmol/L (ref 3.5–5.1)
Sodium: 133 mmol/L — ABNORMAL LOW (ref 135–145)

## 2021-11-04 LAB — CBC
HCT: 46.3 % (ref 39.0–52.0)
Hemoglobin: 16.3 g/dL (ref 13.0–17.0)
MCH: 32.2 pg (ref 26.0–34.0)
MCHC: 35.2 g/dL (ref 30.0–36.0)
MCV: 91.5 fL (ref 80.0–100.0)
Platelets: 303 10*3/uL (ref 150–400)
RBC: 5.06 MIL/uL (ref 4.22–5.81)
RDW: 12.4 % (ref 11.5–15.5)
WBC: 8 10*3/uL (ref 4.0–10.5)
nRBC: 0 % (ref 0.0–0.2)

## 2021-11-04 LAB — GLUCOSE, CAPILLARY: Glucose-Capillary: 115 mg/dL — ABNORMAL HIGH (ref 70–99)

## 2021-11-04 LAB — VANCOMYCIN, RANDOM: Vancomycin Rm: 38 ug/mL

## 2021-11-04 MED ORDER — DOXYCYCLINE HYCLATE 100 MG PO TABS: 100.0000 mg | ORAL_TABLET | Freq: Two times a day (BID) | ORAL | Status: DC

## 2021-11-04 NOTE — Progress Notes (Signed)
Physical Therapy Treatment Patient Details Name: Roberto [name] MRN: [id number] DOB: 1951/04/29 Today's Date: 11/04/2021   History of Present Illness 71 yo male admitted with L side facial and extremity numbness starting 6/7 MRI(+) multifocal acute ischemia R PCA territory involving dorsal thalmas, medial R temporal lobe, R occipial lobe. Code stroke activated 6/12, MRI revealing Increased size of evolving early subacute right PCA distribution infarct.  PMH CVA 2017 glaucoma, HTN    PT Comments    Pt with depressed spirits today regarding inability to walk and elongated process of transitioning to rehab. Pt gave good effort t/o session and demonstrated improved sit to stand ability and ability to prevent L knee buckling in standing. Pt adamantly refused to get up in the chair stating it hurts his back to much. Pt educated that if he wanted to go to rehab he would need to be able to tolerate sitting in a chair, pt continues to refuse. Pt apologized for his attitude stating "I am sorry. I'm usually pretty laid back guy." Acute PT to cont to follow.    Recommendations for follow up therapy are one component of a multi-disciplinary discharge planning process, led by the attending physician.  Recommendations may be updated based on patient status, additional functional criteria and insurance authorization.  Follow Up Recommendations  Acute inpatient rehab (3hours/day)     Assistance Recommended at Discharge Frequent or constant Supervision/Assistance  Patient can return home with the following Two people to help with walking and/or transfers;A little help with bathing/dressing/bathroom;Assistance with cooking/housework;Assist for transportation;Help with stairs or ramp for entrance   Equipment Recommendations   (to be determined)    Recommendations for Other Services Rehab consult     Precautions / Restrictions Precautions Precautions: Fall Precaution Comments: L UE flaccid, L homohemianopsia,  L inattention Restrictions Weight Bearing Restrictions: No     Mobility  Bed Mobility Overal bed mobility: Needs Assistance Bed Mobility: Sidelying to Sit, Rolling, Sit to Supine Rolling: Mod assist (with max directional cues) Sidelying to sit: HOB elevated, Mod assist Supine to sit: Mod assist, +2 for physical assistance Sit to supine: Mod assist   General bed mobility comments: pt required max directional verbal and tactile cues to roll L and complete transfer up to EOB, pt with noted L impaired vision, pt with good command follow and effort once given cues, pt unable to complete without cues    Transfers Overall transfer level: Needs assistance Equipment used: 1 person hand held assist Transfers: Sit to/from Stand Sit to Stand: Mod assist           General transfer comment: pt with improved sit to stand ability requiring less physical assist but continues to require max directional verbal cues and assist at L knee for optimal position and to prevent buckling initially, completed 4 sit to stand transfers    Ambulation/Gait               General Gait Details: unable to attempt due to unsafe with 1 person   Stairs             Wheelchair Mobility    Modified Rankin (Stroke Patients Only) Modified Rankin (Stroke Patients Only) Pre-Morbid Rankin Score: No symptoms Modified Rankin: Severe disability     Balance Overall balance assessment: Needs assistance Sitting-balance support: Feet supported, Single extremity supported Sitting balance-Leahy Scale: Poor Sitting balance - Comments: pt able to maintain EOB balance with close min guard assist today and support of R UE, pt without L lateral lean  Standing balance support: Single extremity supported Standing balance-Leahy Scale: Poor Standing balance comment: external support required to maintain midline standing balance with standing tolerance of less than one minute.                             Cognition Arousal/Alertness: Awake/alert Behavior During Therapy: Flat affect, WFL for tasks assessed/performed Overall Cognitive Status: Impaired/Different from baseline Area of Impairment: Safety/judgement, Problem solving, Awareness                         Safety/Judgement: Decreased awareness of safety, Decreased awareness of deficits (L neglect) Awareness: Intellectual Problem Solving: Slow processing, Requires verbal cues, Requires tactile cues, Difficulty sequencing General Comments: pt with despressed spirts regarding not being able to walk or go to rehab "upstairs" Pt remains to have unrealistic expectations of going to [place] to be with brother        Exercises Other Exercises Other Exercises: worked on [employer] in standing x 10 reps, x2 sets and L LE, modA to achieve full ROM of hip flexion due to weakness and inability to actively complete Other Exercises: worked on standing with equal weightbearing on each LE and preventing L knee buckling, tactile cues to L quad to remind pt to contract due to impaired proprioception, sensation and impaired vision out of L eye    General Comments General comments (skin integrity, edema, etc.): Pt's resting HR is in 80s      Pertinent Vitals/Pain Pain Assessment Pain Assessment: Faces Faces Pain Scale: Hurts even more Pain Location: Low back, L flank Pain Descriptors / Indicators: Moaning, Grimacing Pain Intervention(s): Monitored during session    Home Living                          Prior Function            PT Goals (current goals can now be found in the care plan section) Acute Rehab PT Goals PT Goal Formulation: With patient Time For Goal Achievement: 11/07/21 Potential to Achieve Goals: Good Progress towards PT goals: Progressing toward goals    Frequency    Min 4X/week      PT Plan Current plan remains appropriate    Co-evaluation              AM-PAC PT "6 Clicks"  Mobility   Outcome Measure  Help needed turning from your back to your side while in a flat bed without using bedrails?: A Lot Help needed moving from lying on your back to sitting on the side of a flat bed without using bedrails?: A Lot Help needed moving to and from a bed to a chair (including a wheelchair)?: A Lot Help needed standing up from a chair using your arms (e.g., wheelchair or bedside chair)?: A Lot Help needed to walk in hospital room?: Total Help needed climbing 3-5 steps with a railing? : Total 6 Click Score: 10    End of Session Equipment Utilized During Treatment: Gait belt Activity Tolerance: Patient tolerated treatment well Patient left: with call bell/phone within reach;in bed;with bed alarm set Nurse Communication: Mobility status PT Visit Diagnosis: Unsteadiness on feet (R26.81);Other abnormalities of gait and mobility (R26.89);Difficulty in walking, not elsewhere classified (R26.2);Other symptoms and signs involving the nervous system (R29.898)     Time: [id number] PT Time Calculation (min) (ACUTE ONLY): 20 min  Charges:  $Neuromuscular Re-education:  8-22 mins                     Lewis Shock, PT, DPT Acute Rehabilitation Services Secure chat preferred Office #: [phone]    Iona Hansen 11/04/2021, 1:36 PM

## 2021-11-04 NOTE — TOC Progression Note (Signed)
Transition of Care [facility]) - Progression Note    Patient Details  Name: Kaliel Bolds MRN: [id number] Date of Birth: 08-Oct-1950  [facility] CM/SW Contact  Baldemar Lenis, [place] Phone Number: 11/04/2021, 4:33 PM  Clinical Narrative:   CSW reached out to [facility] to see if they have made progress on the contract for patient to admit and they still need to call [employer]. CSW to follow.    Expected Discharge Plan: Skilled Nursing Facility Barriers to Discharge: Inadequate or no insurance, English as a second language teacher, Continued Medical Work up  [facility] Expected Discharge Plan: Skilled Nursing Facility     Post Acute Care Choice: Skilled Nursing Facility Living arrangements for the past 2 months: Single Family Home                                       Social Determinants of Health (SDOH) Interventions    Readmission Risk Interventions     No data to display

## 2021-11-04 NOTE — TOC Progression Note (Signed)
Transition of Care [facility]) - Progression Note    Patient Details  Name: Roberto [name] MRN: [id number] Date of Birth: 1951/01/18  Transition of Care [facility]) CM/SW Contact  Baldemar Lenis, [place] Phone Number: 11/04/2021, 4:31 PM  Clinical Narrative:   CSW contacted [facility] to discuss in network SNF options, as all SNF referrals are denied for out of network. CSW spoke with Vernona Rieger ([phone], [phone]) who indicated that there are no in-network SNFs, but they will negotiate a single contract if a SNF is in agreement with taking the patient, they just need to reach out to Vernona Rieger to set that up. CSW contacted [facility] to discuss and they are agreeable to discussing it with [facility] to see if they can create a contract. CSW attempted to meet with patient to discuss possible bed offer but patient off the unit. CSW to follow.    Expected Discharge Plan: Skilled Nursing Facility Barriers to Discharge: Inadequate or no insurance, English as a second language teacher, Continued Medical Work up  [facility] Expected Discharge Plan: Skilled Nursing Facility     Post Acute Care Choice: Skilled Nursing Facility Living arrangements for the past 2 months: Single Family Home                                       Social Determinants of Health (SDOH) Interventions    Readmission Risk Interventions     No data to display

## 2021-11-04 NOTE — Progress Notes (Signed)
HD#11 SUBJECTIVE:  Patient Summary: Roberto [name] is a 71 y.o. with a pertinent PMH of CVA in 2017, who presented with L sided numbness and admitted for R PCA infarct now complicated by MRSA and strep pyogenes bacteremia.  Overnight Events: None  Interim History: Patient overall is doing well this morning. He is unhappy that he has not been discharged to a rehab facility of some sort yet. He has back pain still that he says is helped for some time by his muscle relaxer, but that it comes back after just a few hours.  OBJECTIVE:  Vital Signs: Vitals:   11/03/21 2001 11/03/21 2335 11/04/21 0411 11/04/21 0734  BP: 124/80 139/76 133/82 (!) 142/81  Pulse: 79 74 80 91  Resp: 17 16 19  (!) 23  Temp: 99 F (37.2 C) 98.6 F (37 C) 98.8 F (37.1 C) 98 F (36.7 C)  TempSrc: Oral Oral Oral Oral  SpO2: 91% 92% 91% 91%  Weight:      Height:       Supplemental O2: Room Air SpO2: 91 % O2 Flow Rate (L/min): 2 L/min  Filed Weights   10/23/21 1200 10/24/21 0316  Weight: 81.6 kg 77.1 kg     Intake/Output Summary (Last 24 hours) at 11/04/2021 1137 Last data filed at 11/03/2021 1800 Gross per 24 hour  Intake 360 ml  Output 800 ml  Net -440 ml    Net IO Since Admission: -3,772.39 mL [11/04/21 1137]  Physical Exam: Constitutional:Resting comfortably in bed in no acute distress. Cardio:Regular rate and rhythm. No murmurs, rubs, gallops. Pulm:Normal work of breathing on room air. Skin:Skin is warm and dry.  Neuro: Mental Status: Patient is awake, alert, oriented x3 No signs of aphasia or neglect. Cranial Nerves: III,IV, VI: Does not track to L. Endorses continued diminished vision of L visual field. VIII: Hearing is intact to voice Motor: RUE and RLE 5/5, LUE 2/5, LLE 4/5. Sensory: Sensation is normal on RUE and RLE, not reported on LUE and LLE, and normal to both sides of his face. Psych: Pleasant mood and affect.  Patient Lines/Drains/Airways Status     Active  Line/Drains/Airways     Name Placement date Placement time Site Days   Peripheral IV 10/23/21 20 G Anterior;Distal;Left;Upper Arm 10/23/21  --  Arm  5   External Urinary Catheter 10/26/21  --  --  2             ASSESSMENT/PLAN:  Assessment: Principal Problem:   CVA (cerebral vascular accident) (HCC) Active Problems:   Essential hypertension   Aspiration pneumonia (HCC)   Erythrocytosis   Hyponatremia   Acute kidney injury (HCC)   Bacteremia   Plan: #MRSA and streptococcus pyogenes bacteremia Suspected source is skin from recent contact dermatitis vs. Otogenic from poor dentition vs. prior IV site. Remains afebrile, hemodynamically stable. Acute leukocytosis has resolved. MRI thoracic and lumbar spine do not show findings worrisome fo infection. Repeat blood culture shows NGTD. TEE was negative for endocarditis.  -ID following, appreciate their assistance -Continue vancomycin; will need two week total course (last day 06/26) -F/u repeat blood culture -Trend CBC, fever curve  #L flank pain Patient reports L flank pain is allevated by muscle relaxer however it recurs after just a few hours. The pain at times extends superiorly into his back as well. MRI thoracic and lumbar spine without evidence of infection. -Consider orthopedics f/u as outpatient -Robaxin 500 mg q6h PRN muscle spasms, flank pain -Lidocaine patch  #R PCA Infarct Neurological  exam is stable. Unfortunately due to social barriers once he is discharged it does not appear that he will be able to go to CIR after all; we will wait patiently for a SNF offer and approval. -Neurology following, appreciate their assistance             -[facility] Stroke Study -SCDs only for VTE prophylaxis -Continue atorvastatin 80 mg daily -Continue ASA 81 mg daily x 3 weeks -Continue Plavix 75 mg daily -Lifestyle modifications for pre-diabetes and hypertriglyceridemia   #Acute stress reaction Stable. -CTM. Will plan to recommend  IBH referral on discharge for ongoing psychological support.   #Erythrocytosis Stable. -F/u JAK2 -Trend CBC   #Hypertension Normotensive. Home losartan restarted at half dose, 25 mg daily, 06/17. -Continue losartan at 25 mg daily  #NAGMA #Hyponatremia Stable. 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 -Trend BMP  Best Practice: Diet: Regular IVF: None VTE: Place and maintain sequential compression device Start: 10/26/21 1121 Code: Full AB: Vancomycin Therapy Recs: SNF Family Contact: Misty (stepdaughter) and [name] (partner), called and notified. DISPO: Anticipated discharge pending Medical stability and approval for CIR/insurance auth .  Signature: Champ Mungo, D.O.  Internal Medicine Resident, PGY-1 [facility]  Pager: [phone] 11:37 AM, 11/04/2021   Please contact the on call pager after 5 pm and on weekends at [phone].

## 2021-11-04 NOTE — Progress Notes (Signed)
Pharmacy Antibiotic Note  Roberto [name] is a 71 y.o. male with hospital associated MRSA and Strep pyogenes bacteremia. Had poison ivy about a week ago which he has been scratching, could have introduced bacteremia through those wounds. Pharmacy has been consulted for Vancomycin dosing.  Renal function stable at Scr 0.83   Vanco oeak and trough ordered with the dose given today at 14:30 Peak level resulted as >60 mcg/mL was drawn too early and not reflective of a fully distributed infusion of vancomycin.   Vanco random level resulted as 38 mcg/mL drawn roughly 1 hour 30 minutes after the end of the infusion which is more reflective of a distributed dose.   Will continue with the current dose and await the ordered trough results (11/05/2021 at 14:00)  Plan: Continue Vancomycin 1750 mg IV q24h  Monitor renal function, micro data, and clinical progression Vancomycin levels as indicated  Height: 5\' 6"  (167.6 cm) Weight: 77.1 kg (169 lb 15.6 oz) IBW/kg (Calculated) : 63.8  Temp (24hrs), Avg:98.5 F (36.9 C), Min:98 F (36.7 C), Max:99 F (37.2 C)  Recent Labs  Lab 10/31/21 0243 10/31/21 1459 10/31/21 1602 11/01/21 0314 11/01/21 1139 11/02/21 0351 11/03/21 0055 11/04/21 0221 11/04/21 1653 11/04/21 1840  WBC 8.6  --   --  11.3*  --  11.9* 10.8* 8.0  --   --   CREATININE 0.75  --   --  0.79  --  0.77 0.82 0.83  --   --   VANCOTROUGH  --   --   --   --  7*  --   --   --   --   --   VANCOPEAK  --    < > 31  --   --   --   --   --  >60*  --   VANCORANDOM  --   --   --   --   --   --   --   --   --  38   < > = values in this interval not displayed.     Estimated Creatinine Clearance: 79.8 mL/min (by C-G formula based on SCr of 0.83 mg/dL).    No Known Allergies  Antimicrobials this admission:  Vancomycin 6/13>>  Clindamycin 6/13 >> 6/16  Cefepime x 1 on 6/13  Metronidazole x 1 on 6/13   Unasyn 6/13 x1  Dose adjustments this admission:  Vancomcyin 1500 mg IV q24h >> 1750 mg  q24h  Microbiology results: 6/13 Bcx:GAS, MRSA 6/14 Bcx: NGTD  Thank you for allowing pharmacy to be a part of this patient's care.  7/14 BS, PharmD, BCPS Clinical Pharmacist 11/04/2021 7:25 PM  Contact: [phone] after 3 PM  "Be curious, not judgmental..." -[phone]

## 2021-11-04 NOTE — Progress Notes (Signed)
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 There was concern of erythema/tenderness at the medial right antecubital fossa site previously -the right index finger has a well demarcated pigemented area where previously a spot of contact dermatitis occurred -tee negative for endocarditis -I agree his bacteremia more suspicious to be community onset rather than hospital acquired and likely not checked earlier due to lack of sepsis in setting of advanced age  -he has been complaining of left sided flank pain - midline spine exam 6/19 nontender. Reviewed history again today and he reports this back pain left flank has been chronic long before the bacteremia is diagnosed. Mri without pyogenic finding of spine or paraspinal/paravertebral process  -given concern this was likely community in onset, I would treat 4 weeks to cover for the mrsa part (GAS bsi only need 2 week tx). But reasonable to transition to oral abx if he discharge (if he is here would keep on another week of iv abx prior to PO transition) -I will see him in follow up and make sure the  back pain is still of no infectious complication    #cva Right sided paresis improving Will go to rehab    Recommendations:  -if patient discharge home/snf today can transition to oral abx as below 1 more week of linezolid until 6/28, then transition to doxycycline 2 more weeks until 7/12 -if patient discharge to inpatient rehab here at [facility] could keep on iv vanc for another week until 6/28, then transition to doxycycline 2 more weeks until 7/12 -I will see him in clinic on 7/13 @ 1030   @  [facility] [address], [place], [place] [address] Phone: [phone]  -if patient still at inpatient rehab by 7/13 please let ID team know to follow up inpatient -will sign off -discussed with primary team    I spent more than 35 minute reviewing data/chart, and coordinating care and >50% direct face to face time providing counseling/discussing diagnostics/treatment plan with patient     SUBJECTIVE: Improved right sided weakness No fever chill Left back pain stable moderate No n/v/diarrhea/rash  Mri thoracolumbar spine reviewed no evidence bone/joint/soft tissue infection   Review of Systems: Review of Systems  All other systems reviewed and are negative.    Scheduled Meds:  aspirin  81 mg Oral Daily   atorvastatin  80 mg Oral Daily   clopidogrel  75 mg Oral Daily   lidocaine  1 patch Transdermal Q24H   losartan  25 mg Oral Daily   melatonin  3 mg  Oral QHS   OCEANIC-STROKE asundexian or placebo  1 tablet Oral Daily   Continuous Infusions:  vancomycin 1,750 mg (11/03/21 1301)   PRN Meds:.acetaminophen, calamine, loperamide, methocarbamol No Known Allergies  OBJECTIVE: Vitals:   11/03/21 2335 11/04/21 0411 11/04/21 0734 11/04/21 1100  BP: 139/76 133/82 (!) 142/81 130/78  Pulse: 74 80 91 86  Resp: 16 19 (!) 23 19  Temp: 98.6 F (37 C) 98.8 F (37.1 C) 98 F (36.7 C) 98.6 F (37 C)  TempSrc: Oral Oral Oral Oral  SpO2: 92% 91% 91% 98%  Weight:       Height:       Body mass index is 27.43 kg/m.  Exam: General/constitutional: no distress, pleasant HEENT: Normocephalic, PER, Conj Clear, EOMI, Oropharynx clear Neck supple CV: rrr no mrg Lungs: clear to auscultation, normal respiratory effort Abd: Soft, Nontender Ext: no edema Skin: No Rash  Neuro: right paraparesis improved  MSK: no midline back tenderness; mild-moderate tenderness left paralumbar area    Lab Results Lab Results  Component Value Date   WBC 8.0 11/04/2021   HGB 16.3 11/04/2021   HCT 46.3 11/04/2021   MCV 91.5 11/04/2021   PLT 303 11/04/2021    Lab Results  Component Value Date   CREATININE 0.83 11/04/2021   BUN 21 11/04/2021   NA 133 (L) 11/04/2021   K 3.9 11/04/2021   CL 106 11/04/2021   CO2 18 (L) 11/04/2021    Lab Results  Component Value Date   ALT 24 10/27/2021   AST 23 10/27/2021   ALKPHOS 63 10/27/2021   BILITOT 1.4 (H) 10/27/2021     Imaging: Personally reviewed. Finding incorporated into decision making  6/14 mri brain  1. Motion degraded exam. 2. Interval worsening and expansion of previously identified right PCA distribution infarct, now fairly large in and involving the majority of the right PCA distribution. Minimal associated petechial blood products without frank hemorrhagic transformation. 3. No other new acute intracranial abnormality.   6/16 tee  1. Left ventricular ejection fraction, by estimation, is 60 to 65%. The  left ventricle has normal function.   2. Right ventricular systolic function is normal. The right ventricular  size is normal.   3. No left atrial/left atrial appendage thrombus was detected.   4. The mitral valve is normal in structure. Trivial mitral valve  regurgitation. No evidence of mitral stenosis.   5. The aortic valve is tricuspid. Aortic valve regurgitation is not  visualized. No aortic stenosis is present.   6. There is Moderate (Grade III) plaque involving the descending aorta.   7.  Evidence of atrial level shunting detected by color flow Doppler.  Agitated saline contrast bubble study was positive with shunting observed  within 3-6 cardiac cycles suggestive of interatrial shunt.   Conclusion(s)/Recommendation(s): No evidence of vegetation/infective  endocarditis on this transesophageael echocardiogram. 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     6/20 mri thoracolumbar spine 1. No acute abnormality of the thoracic or lumbar spine. 2. Chronic L5 pars defects with grade 1 anterolisthesis at L5-S1 and severe bilateral neuroforaminal stenosis. 3. Additional multilevel thoracolumbar spondylosis as described above.   Raymondo Band, MD [facility] 11/04/2021, 2:36 PM

## 2021-11-05 DIAGNOSIS — I63531 Cerebral infarction due to unspecified occlusion or stenosis of right posterior cerebral artery: Secondary | ICD-10-CM | POA: Diagnosis not present

## 2021-11-05 LAB — BASIC METABOLIC PANEL
Anion gap: 9 (ref 5–15)
BUN: 20 mg/dL (ref 8–23)
CO2: 24 mmol/L (ref 22–32)
Calcium: 8.6 mg/dL — ABNORMAL LOW (ref 8.9–10.3)
Chloride: 101 mmol/L (ref 98–111)
Creatinine, Ser: 1.02 mg/dL (ref 0.61–1.24)
GFR, Estimated: >60 mL/min (ref >60)
Glucose, Bld: 109 mg/dL — ABNORMAL HIGH (ref 70–99)
Potassium: 3.8 mmol/L (ref 3.5–5.1)
Sodium: 134 mmol/L — ABNORMAL LOW (ref 135–145)

## 2021-11-05 LAB — CBC
HCT: 47.1 % (ref 39.0–52.0)
Hemoglobin: 16 g/dL (ref 13.0–17.0)
MCH: 32 pg (ref 26.0–34.0)
MCHC: 34 g/dL (ref 30.0–36.0)
MCV: 94.2 fL (ref 80.0–100.0)
Platelets: 334 10*3/uL (ref 150–400)
RBC: 5 MIL/uL (ref 4.22–5.81)
RDW: 12.4 % (ref 11.5–15.5)
WBC: 6.8 10*3/uL (ref 4.0–10.5)
nRBC: 0 % (ref 0.0–0.2)

## 2021-11-05 LAB — VANCOMYCIN, TROUGH: Vancomycin Tr: 15 ug/mL (ref 15–20)

## 2021-11-05 MED ORDER — METHOCARBAMOL 500 MG PO TABS
1000.0000 mg | ORAL_TABLET | Freq: Four times a day (QID) | ORAL | Status: DC | PRN
Start: 2021-11-05 — End: 2021-11-07
  Administered 2021-11-05 – 2021-11-06 (×4): 1000 mg via ORAL
  Filled 2021-11-05 (×4): qty 2

## 2021-11-05 MED ORDER — VANCOMYCIN HCL 1500 MG/300ML IV SOLN
1500.0000 mg | INTRAVENOUS | Status: DC
  Administered 2021-11-06: 1500 mg via INTRAVENOUS
  Filled 2021-11-05: qty 300

## 2021-11-05 MED ORDER — KETOROLAC TROMETHAMINE 15 MG/ML IJ SOLN
15.0000 mg | Freq: Once | INTRAMUSCULAR | Status: AC
  Administered 2021-11-05: 15 mg via INTRAVENOUS
  Filled 2021-11-05: qty 1

## 2021-11-05 NOTE — Progress Notes (Addendum)
Occupational Therapy Treatment Patient Details Name: Roberto [name] MRN: [id number] DOB: 01-Aug-1950 Today's Date: 11/05/2021   History of present illness 71 yo male admitted with L side facial and extremity numbness starting 6/7 MRI(+) multifocal acute ischemia R PCA territory involving dorsal thalmas, medial R temporal lobe, R occipial lobe. Code stroke activated 6/12, MRI revealing Increased size of evolving early subacute right PCA distribution infarct.  PMH CVA 2017 glaucoma, HTN   OT comments  Pt remains motivated, making slow progression toward goals. Pt with improved grasp in L hand, all digits able to weakly flex to grasp therapist's hand. Provided pt with washcloth to squeeze, pt able to squeeze x5. Pt mod A for UB bathing, mod A for bed mobility, and mod-max A +2 for chair transfer. Pt educated on using washcloth and/or applying lotion to LUE to improve sensation. Pt verbalized understanding, able to perform during session. Pt presenting with impairments listed below, will follow acutely. Continue to recommend SNF at d/c.   Recommendations for follow up therapy are one component of a multi-disciplinary discharge planning process, led by the attending physician.  Recommendations may be updated based on patient status, additional functional criteria and insurance authorization.    Follow Up Recommendations  Skilled nursing-short term rehab (<3 hours/day)    Assistance Recommended at Discharge Frequent or constant Supervision/Assistance  Patient can return home with the following  Two people to help with walking and/or transfers;A lot of help with bathing/dressing/bathroom;Assist for transportation;Assistance with feeding;Assistance with cooking/housework;Direct supervision/assist for medications management;Direct supervision/assist for financial management   Equipment Recommendations  Other (comment);None recommended by OT (defer to next venue of care)    Recommendations for Other Services  Rehab consult    Precautions / Restrictions Precautions Precautions: Fall Precaution Comments: L UE flaccid, L homohemianopsia, L inattention Restrictions Weight Bearing Restrictions: No       Mobility Bed Mobility Overal bed mobility: Needs Assistance Bed Mobility: Sidelying to Sit, Rolling, Sit to Supine     Supine to sit: Mod assist Sit to supine: Mod assist   General bed mobility comments: cues to use RUE and move BLE's to EOB    Transfers Overall transfer level: Needs assistance Equipment used: 2 person hand held assist Transfers: Sit to/from Stand, Bed to chair/wheelchair/BSC Sit to Stand: Mod assist, Max assist, +2 physical assistance     Step pivot transfers: Max assist, +2 physical assistance     General transfer comment: with L knee block and cues for sequencing, pt fatiguing quickly stating "I need to sit down"     Balance Overall balance assessment: Needs assistance Sitting-balance support: Feet supported, Single extremity supported Sitting balance-Leahy Scale: Fair Sitting balance - Comments: close min guard, requiries RUE support, unable to remain upright when challenged Postural control: Posterior lean Standing balance support: Single extremity supported Standing balance-Leahy Scale: Poor Standing balance comment: reliant on external support                           ADL either performed or assessed with clinical judgement   ADL Overall ADL's : Needs assistance/impaired         Upper Body Bathing: Moderate assistance Upper Body Bathing Details (indicate cue type and reason): simulated bathing UB             Toilet Transfer: Maximal assistance;+2 for physical assistance;Stand-pivot;BSC/3in1 Toilet Transfer Details (indicate cue type and reason): L knee block         Functional mobility during  ADLs: Maximal assistance;+2 for physical assistance      Extremity/Trunk Assessment Upper Extremity Assessment Upper Extremity  Assessment: LUE deficits/detail LUE Deficits / Details: no active movement in L shoulder, elbow/ wrist, has trace grasp with minimal flexion in all digits, unable to feel light or deep pressure to LUE LUE Sensation: decreased light touch;decreased proprioception LUE Coordination: decreased fine motor;decreased gross motor   Lower Extremity Assessment Lower Extremity Assessment: Defer to PT evaluation        Vision   Vision Assessment?: Vision impaired- to be further tested in functional context Additional Comments: L homonymous hemianopsia, R gaze with L inattention. Needs repeated cues to attend to LUE during exercises.   Perception Perception Perception: Impaired   Praxis Praxis Praxis: Impaired Praxis Impairment Details: Ideation;Initiation;Ideomotor    Cognition Arousal/Alertness: Lethargic Behavior During Therapy: Flat affect, WFL for tasks assessed/performed Overall Cognitive Status: Impaired/Different from baseline Area of Impairment: Safety/judgement, Problem solving, Awareness                         Safety/Judgement: Decreased awareness of safety, Decreased awareness of deficits Awareness: Intellectual Problem Solving: Slow processing, Requires verbal cues, Requires tactile cues, Difficulty sequencing General Comments: eyes closed frequently during session, able to open eyes and respond to questions when asked        Exercises Exercises: General Upper Extremity General Exercises - Upper Extremity Shoulder Flexion: PROM, Left, 5 reps, Seated Shoulder ABduction: PROM, Left, 5 reps, Seated Elbow Flexion: PROM, Left, 5 reps, Seated Elbow Extension: PROM, Left, 5 reps, Seated Other Exercises Other Exercises: WB on L elbow x3    Shoulder Instructions       General Comments BP WNL post-chair transfer    Pertinent Vitals/ Pain       Pain Assessment Pain Assessment: Faces Pain Score: 4  Faces Pain Scale: Hurts little more Pain Location: Low back, L  flank Pain Descriptors / Indicators: Moaning, Grimacing Pain Intervention(s): Limited activity within patient's tolerance, Monitored during session, Repositioned  Home Living                                          Prior Functioning/Environment              Frequency  Min 2X/week        Progress Toward Goals  OT Goals(current goals can now be found in the care plan section)  Progress towards OT goals: Progressing toward goals  Acute Rehab OT Goals Patient Stated Goal: to be able to walk and use my L side again OT Goal Formulation: With patient Time For Goal Achievement: 11/19/21 Potential to Achieve Goals: Good ADL Goals Pt Will Perform Eating: with min guard assist;sitting;with adaptive utensils Pt Will Perform Grooming: with min guard assist;sitting;with adaptive equipment Pt Will Transfer to Toilet: with mod assist;ambulating;bedside commode;stand pivot transfer;squat pivot transfer Additional ADL Goal #1: pt will locate L UE with R UE and position Mod I prior to any transfer attempts Additional ADL Goal #2: pt will complete HEP L UE with Min (A) written directions with girlfriend  Plan Frequency remains appropriate;Discharge plan needs to be updated    Co-evaluation                 AM-PAC OT "6 Clicks" Daily Activity     Outcome Measure   Help from another person eating meals?: A Lot  Help from another person taking care of personal grooming?: A Lot Help from another person toileting, which includes using toliet, bedpan, or urinal?: Total Help from another person bathing (including washing, rinsing, drying)?: A Lot Help from another person to put on and taking off regular upper body clothing?: A Lot Help from another person to put on and taking off regular lower body clothing?: Total 6 Click Score: 10    End of Session Equipment Utilized During Treatment: Gait belt  OT Visit Diagnosis: Unsteadiness on feet (R26.81);Muscle weakness  (generalized) (M62.81);Low vision, both eyes (H54.2)   Activity Tolerance Patient tolerated treatment well   Patient Left in chair;with call bell/phone within reach;with chair alarm set   Nurse Communication Mobility status        Time: 6659-9357 OT Time Calculation (min): 33 min  Charges: OT General Charges $OT Visit: 1 Visit OT Treatments $Therapeutic Activity: 23-37 mins  Alfonzo Beers, OTD, OTR/L Acute Rehab [phone]   Mayer Masker 11/05/2021, 11:51 AM

## 2021-11-05 NOTE — Progress Notes (Signed)
Physical Therapy Treatment Patient Details Name: Roberto [name] MRN: [id number] DOB: 10-Dec-1950 Today's Date: 11/05/2021   History of Present Illness 71 yo male admitted with L side facial and extremity numbness starting 6/7 MRI(+) multifocal acute ischemia R PCA territory involving dorsal thalmas, medial R temporal lobe, R occipial lobe. Code stroke activated 6/12, MRI revealing Increased size of evolving early subacute right PCA distribution infarct.  PMH CVA 2017 glaucoma, HTN    PT Comments    Pt supine in bed.  Performed OOB to stand and attempted gt progression with poor tolerance due to fatigue.  May require knee brace next session to immobilize knee and prevent buckling in stance phase.  Continue to recommend rehab in a post acute setting.     Recommendations for follow up therapy are one component of a multi-disciplinary discharge planning process, led by the attending physician.  Recommendations may be updated based on patient status, additional functional criteria and insurance authorization.  Follow Up Recommendations  Acute inpatient rehab (3hours/day)     Assistance Recommended at Discharge Frequent or constant Supervision/Assistance  Patient can return home with the following Two people to help with walking and/or transfers;A little help with bathing/dressing/bathroom;Assistance with cooking/housework;Assist for transportation;Help with stairs or ramp for entrance   Equipment Recommendations   (TBD)    Recommendations for Other Services Rehab consult     Precautions / Restrictions Precautions Precautions: Fall Precaution Comments: L UE flaccid, L homohemianopsia, L inattention Restrictions Weight Bearing Restrictions: No     Mobility  Bed Mobility Overal bed mobility: Needs Assistance Bed Mobility: Sidelying to Sit, Rolling, Sit to Supine Rolling: Mod assist Sidelying to sit: HOB elevated, Mod assist Supine to sit: Mod assist Sit to supine: Mod assist   General  bed mobility comments: Pt with poor abaility to use R side.  He required assistance to advance B LEs to edge of bed and elevate trunk into a seated position.    Transfers Overall transfer level: Needs assistance Equipment used: Rolling walker (2 wheels) Transfers: Sit to/from Stand Sit to Stand: Mod assist, +2 physical assistance           General transfer comment: with L knee block and cues for sequencing, PTA held patient R hand to RW supporting shoulder and blocking L knee in stance phase.  The RW did not work well, May try hemiwalker next session.    Ambulation/Gait Ambulation/Gait assistance: Total assist, Max assist, +2 physical assistance Gait Distance (Feet): 4 Feet Assistive device: Rolling walker (2 wheels), 2 person hand held assist Gait Pattern/deviations: Step-to pattern, Trunk flexed, Wide base of support       General Gait Details: Pt performed 4 steps away from bed, When switching sequencing to retro gt he became fatigued and required rest break on chair at side of bed before returning back to bed.   Stairs             Wheelchair Mobility    Modified Rankin (Stroke Patients Only) Modified Rankin (Stroke Patients Only) Pre-Morbid Rankin Score: No symptoms Modified Rankin: Moderately severe disability     Balance Overall balance assessment: Needs assistance Sitting-balance support: Feet supported, Single extremity supported Sitting balance-Leahy Scale: Fair   Postural control: Posterior lean                                  Cognition Arousal/Alertness: Lethargic Behavior During Therapy: Flat affect, WFL for tasks assessed/performed Overall Cognitive  Status: Impaired/Different from baseline Area of Impairment: Safety/judgement, Problem solving, Awareness                         Safety/Judgement: Decreased awareness of safety, Decreased awareness of deficits Awareness: Intellectual Problem Solving: Slow processing,  Requires verbal cues, Requires tactile cues, Difficulty sequencing General Comments: eyes closed frequently during session, able to open eyes and respond to questions when asked        Exercises      General Comments        Pertinent Vitals/Pain Pain Assessment Pain Assessment: Faces Pain Score: 4  Pain Location: Low back, L flank Pain Descriptors / Indicators: Moaning, Grimacing Pain Intervention(s): Monitored during session, Repositioned    Home Living                          Prior Function            PT Goals (current goals can now be found in the care plan section) Acute Rehab PT Goals Patient Stated Goal: to go home and take a hot bath Potential to Achieve Goals: Good Progress towards PT goals: Progressing toward goals    Frequency    Min 4X/week      PT Plan Current plan remains appropriate    Co-evaluation              AM-PAC PT "6 Clicks" Mobility   Outcome Measure  Help needed turning from your back to your side while in a flat bed without using bedrails?: A Lot Help needed moving from lying on your back to sitting on the side of a flat bed without using bedrails?: A Lot Help needed moving to and from a bed to a chair (including a wheelchair)?: A Lot Help needed standing up from a chair using your arms (e.g., wheelchair or bedside chair)?: A Lot Help needed to walk in hospital room?: Total Help needed climbing 3-5 steps with a railing? : Total 6 Click Score: 10    End of Session Equipment Utilized During Treatment: Gait belt Activity Tolerance: Patient tolerated treatment well Patient left: with call bell/phone within reach;in bed;with bed alarm set Nurse Communication: Mobility status PT Visit Diagnosis: Unsteadiness on feet (R26.81);Other abnormalities of gait and mobility (R26.89);Difficulty in walking, not elsewhere classified (R26.2);Other symptoms and signs involving the nervous system (R29.898)     Time: 1610-9604 PT  Time Calculation (min) (ACUTE ONLY): 16 min  Charges:  $Gait Training: 8-22 mins                     Roberto [name] , PTA Acute Rehabilitation Services  Office [phone]    Roberto [name] 11/05/2021, 4:45 PM

## 2021-11-05 NOTE — Progress Notes (Signed)
Pharmacy Antibiotic Note- follow-up  Roberto [name] is a 71 y.o. male with hospital associated MRSA and Strep pyogenes bacteremia. Pharmacy was consulted for Vancomycin dosing.  Renal function:  Scr 0.83 on 6/21 Scr 1.02 on 6/22 (up ~19%)  Vanco level assessment:  Peak and trough levels drawn after 11/04/2021 dose  Dose infusion started at 14:38 lasting 2 hours   Random (use as a peak) drawn at 18:40 resulted as 38 mcg/mL  Trough drawn at 14:05 resulted as  15 mcg/mL cAUC: 626 mcg*hr/mL T [1/2]: 14.5 hours Ke: 0.0479 hr-1  Plan: Change Vancomycin to 1500 mg IV q24h starting 11/06/2021 at 18:00   Height: 5\' 6"  (167.6 cm) Weight: 77.1 kg (169 lb 15.6 oz) IBW/kg (Calculated) : 63.8  Temp (24hrs), Avg:98.2 F (36.8 C), Min:97.8 F (36.6 C), Max:98.6 F (37 C)  Recent Labs  Lab 10/31/21 1602 11/01/21 0314 11/01/21 1139 11/02/21 0351 11/03/21 0055 11/04/21 0221 11/04/21 1653 11/04/21 1840 11/05/21 0252 11/05/21 1405  WBC  --  11.3*  --  11.9* 10.8* 8.0  --   --  6.8  --   CREATININE  --  0.79  --  0.77 0.82 0.83  --   --  1.02  --   VANCOTROUGH  --   --  7*  --   --   --   --   --   --  15  VANCOPEAK 31  --   --   --   --   --  >60*  --   --   --   VANCORANDOM  --   --   --   --   --   --   --  38  --   --     Estimated Creatinine Clearance: 64.9 mL/min (by C-G formula based on SCr of 1.02 mg/dL).    No Known Allergies  Antimicrobials this admission:  Vancomycin 6/13>>  Clindamycin 6/13 >> 6/16  Cefepime x 1 on 6/13  Metronidazole x 1 on 6/13   Unasyn 6/13 x1  Dose adjustments this admission:  Vancomcyin 1750 mg q24h >> 1500 mg iv q24h  Microbiology results: 6/13 Bcx:GAS, MRSA 6/14 Bcx: NGTD  Thank you for allowing pharmacy to be a part of this patient's care.  7/14 BS, PharmD, BCPS Clinical Pharmacist 11/05/2021 3:16 PM  Contact: [phone] after 3 PM  "Be curious, not judgmental..." -[phone]

## 2021-11-05 NOTE — TOC Progression Note (Addendum)
Transition of Care [facility] [place]) - Progression Note    Patient Details  Name: Audie Stayer MRN: [id number] Date of Birth: [birth year]  [facility]) CM/SW Contact  Carley Hammed, [place] Phone Number: 11/05/2021, 12:58 PM  Clinical Narrative:    CSW spoke with Wynona Canes at [facility] who noted she would need to ask and confirm about the insurance and would call CSW back. CSW had not heard back so followed up with Wynona Canes again. CSW spoke with Gunnar Fusi who confirmed with Wynona Canes that authorization has been started, but not approved yet.  He is enrolled in [facility] trial, MD team following up with facility to make sure they can accommodate. TOC will continue to follow for DC needs.  CSW spoke with Wynona Canes at [place] who noted they will likely be able to accept pt tomorrow. They needed pt's new Medicare # prior to discharge. Pt and family noted they did not have it. CSW spoke with Medicare and number was provided. Pt's girlfriend would like to be called prior to DC so she can pick up pt's belongings. CSW attempted to update facility, VM left. TOC will continue to follow for DC needs.  Christine with [place] called back and Medicare number provided. Plan to DC tomorrow. [phone] Medicare # [id number] Expected Discharge Plan: Skilled Nursing Facility Barriers to Discharge: Inadequate or no insurance, English as a second language teacher, Continued Medical Work up  [facility] Expected Discharge Plan: Skilled Nursing Facility     Post Acute Care Choice: Skilled Nursing Facility Living arrangements for the past 2 months: Single Family Home                                       Social Determinants of Health (SDOH) Interventions    Readmission Risk Interventions     No data to display

## 2021-11-05 NOTE — Progress Notes (Signed)
HD#12 SUBJECTIVE:  Patient Summary: Roberto [name] is a 71 y.o. with a pertinent PMH of CVA in 2017, who presented with L sided numbness and admitted for R PCA infarct now complicated by MRSA and strep pyogenes bacteremia.  Overnight Events: None  Interim History: Patient is having continued back discomfort. He endorses staying in bed throughout the day with the exception of when he works with PT. He did try and get up into the recliner a few days ago however says he had to stay there for several hours and this was uncomfortable. He is agreeable to getting out of bed into the recliner today as tolerated. He also has an offer at a SNF pending insurance authorization.  OBJECTIVE:  Vital Signs: Vitals:   11/04/21 2041 11/04/21 2322 11/05/21 0420 11/05/21 0700  BP: 137/75 112/79 117/80 117/80  Pulse: 80 72 66 70  Resp: 14 20 13 16   Temp: 98.4 F (36.9 C) 98.6 F (37 C) 97.8 F (36.6 C) 98 F (36.7 C)  TempSrc: Oral Oral Oral Oral  SpO2: 100% 99% 97% 98%  Weight:      Height:       Supplemental O2: Room Air SpO2: 98 % O2 Flow Rate (L/min): 2 L/min  Filed Weights   10/23/21 1200 10/24/21 0316  Weight: 81.6 kg 77.1 kg     Intake/Output Summary (Last 24 hours) at 11/05/2021 1153 Last data filed at 11/04/2021 1400 Gross per 24 hour  Intake 240 ml  Output --  Net 240 ml    Net IO Since Admission: -3,292.39 mL [11/05/21 1153]  Physical Exam: Constitutional:Resting comfortably in bed in no acute distress. Cardio:Regular rate and rhythm. No murmurs, rubs, gallops. Pulm:Normal work of breathing on room air. Skin:Skin is warm and dry.  Neuro: Mental Status: Patient is awake, alert, oriented x3 No signs of aphasia or neglect. Cranial Nerves: III,IV, VI: Does not track to L. Endorses continued diminished vision of L visual field. VIII: Hearing is intact to voice Motor: RUE and RLE 5/5, LUE 2/5, LLE 4/5. Sensory: Sensation is normal on RUE and RLE, not reported on LUE and LLE,  and normal to both sides of his face. Psych: Pleasant mood and affect.  Patient Lines/Drains/Airways Status     Active Line/Drains/Airways     Name Placement date Placement time Site Days   Peripheral IV 10/23/21 20 G Anterior;Distal;Left;Upper Arm 10/23/21  --  Arm  5   External Urinary Catheter 10/26/21  --  --  2             ASSESSMENT/PLAN:  Assessment: Principal Problem:   CVA (cerebral vascular accident) (HCC) Active Problems:   Essential hypertension   Aspiration pneumonia (HCC)   Erythrocytosis   Hyponatremia   Acute kidney injury (HCC)   Bacteremia   Plan: #MRSA and streptococcus pyogenes bacteremia Suspected source is skin from recent contact dermatitis vs. Otogenic from poor dentition vs. prior IV site. Remains afebrile, hemodynamically stable. He will remain on IV vancomycin until discharge. -ID following, appreciate their assistance -Continue vancomycin; will need two week total antibiotic course (last day 06/26) -F/u repeat blood culture -Trend CBC, fever curve  #L flank pain Patient reports continued L flank pain. He endorses staying in bed throughout the day with the exception of when he works with PT. He did try and get up into the recliner a few days ago however says he had to stay there for several hours and this was uncomfortable. He is agreeable to getting out  of bed into the recliner today as tolerated.  -Consider orthopedics f/u as outpatient -Robaxin 1000 mg q6h PRN muscle spasms, flank pain -Lidocaine patch -Encouraged patient to get out of bed more frequently as tolerated  #R PCA Infarct Neurological exam is stable. He is feeling down but hopes to be able to walk again someday. He says he has accepted that he will not be able to work again, but walking is his goal. -Neurology following, appreciate their assistance             -[place] Stroke Study -SCDs only for VTE prophylaxis -Continue atorvastatin 80 mg daily -Continue ASA 81 mg daily x  3 weeks -Continue Plavix 75 mg daily -Lifestyle modifications for pre-diabetes and hypertriglyceridemia   #Adjustment disorder Stable. He does not meet DSM-5 criteria for major depression disorder. -CTM. Will plan to recommend IBH referral on discharge for ongoing psychological support.   #Erythrocytosis Stable. JAK2 testing was negative. -F/u JAK2   #Hypertension Normotensive. Home losartan restarted at half dose, 25 mg daily, 06/17. -Continue losartan at 25 mg daily  #NAGMA, resolved #Hyponatremia, resolved Stable. 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 -Trend BMP  Best Practice: Diet: Regular IVF: None VTE: Place and maintain sequential compression device Start: 10/26/21 1121 Code: Full AB: Vancomycin Therapy Recs: SNF Family Contact: Misty (stepdaughter) and [name] (partner), called and notified. DISPO: Anticipated discharge pending Medical stability and approval for CIR/insurance auth .  Signature: Champ Mungo, D.O.  Internal Medicine Resident, PGY-1 [facility]  Pager: [phone] 11:53 AM, 11/05/2021   Please contact the on call pager after 5 pm and on weekends at [phone].

## 2021-11-06 DIAGNOSIS — I63531 Cerebral infarction due to unspecified occlusion or stenosis of right posterior cerebral artery: Secondary | ICD-10-CM | POA: Diagnosis not present

## 2021-11-06 LAB — BASIC METABOLIC PANEL
Anion gap: 9 (ref 5–15)
BUN: 18 mg/dL (ref 8–23)
CO2: 21 mmol/L — ABNORMAL LOW (ref 22–32)
Calcium: 8.9 mg/dL (ref 8.9–10.3)
Chloride: 106 mmol/L (ref 98–111)
Creatinine, Ser: 0.73 mg/dL (ref 0.61–1.24)
GFR, Estimated: >60 mL/min (ref >60)
Glucose, Bld: 102 mg/dL — ABNORMAL HIGH (ref 70–99)
Potassium: 3.7 mmol/L (ref 3.5–5.1)
Sodium: 136 mmol/L (ref 135–145)

## 2021-11-06 LAB — CBC
HCT: 46.6 % (ref 39.0–52.0)
Hemoglobin: 15.5 g/dL (ref 13.0–17.0)
MCH: 31.3 pg (ref 26.0–34.0)
MCHC: 33.3 g/dL (ref 30.0–36.0)
MCV: 94 fL (ref 80.0–100.0)
Platelets: 348 10*3/uL (ref 150–400)
RBC: 4.96 MIL/uL (ref 4.22–5.81)
RDW: 12.5 % (ref 11.5–15.5)
WBC: 6.6 10*3/uL (ref 4.0–10.5)
nRBC: 0 % (ref 0.0–0.2)

## 2021-11-06 MED ORDER — STUDY - OCEANIC-STROKE - ASUNDEXIAN 50 MG OR PLACEBO TABLET (PI-SETHI): 1.0000 | ORAL_TABLET | Freq: Every day | ORAL | 0 refills | Status: DC

## 2021-11-06 MED ORDER — LINEZOLID 600 MG PO TABS
600.0000 mg | ORAL_TABLET | Freq: Two times a day (BID) | ORAL | 0 refills | Status: AC
Start: 2021-11-07 — End: 2021-11-11

## 2021-11-06 MED ORDER — METHOCARBAMOL 1000 MG PO TABS: 1000.0000 mg | ORAL_TABLET | Freq: Four times a day (QID) | ORAL | 0 refills | Status: AC | PRN

## 2021-11-06 MED ORDER — CALAMINE EX LOTN: 1.0000 | TOPICAL_LOTION | Freq: Three times a day (TID) | CUTANEOUS | 0 refills | Status: AC | PRN

## 2021-11-06 MED ORDER — ACETAMINOPHEN 500 MG PO TABS: 1000.0000 mg | ORAL_TABLET | Freq: Four times a day (QID) | ORAL | 0 refills | Status: AC | PRN

## 2021-11-06 MED ORDER — ATORVASTATIN CALCIUM 80 MG PO TABS: 80.0000 mg | ORAL_TABLET | Freq: Every day | ORAL | 3 refills | Status: AC

## 2021-11-06 MED ORDER — ASPIRIN 81 MG PO CHEW: 81.0000 mg | CHEWABLE_TABLET | Freq: Every day | ORAL | 3 refills | Status: AC

## 2021-11-06 MED ORDER — LIDOCAINE 5 % EX PTCH: 1.0000 | MEDICATED_PATCH | CUTANEOUS | 0 refills | Status: AC

## 2021-11-06 MED ORDER — LOSARTAN POTASSIUM 25 MG PO TABS: 25.0000 mg | ORAL_TABLET | Freq: Every day | ORAL | 3 refills | Status: DC

## 2021-11-06 MED ORDER — DOXYCYCLINE HYCLATE 100 MG PO TABS: 100.0000 mg | ORAL_TABLET | Freq: Two times a day (BID) | ORAL | 0 refills | Status: AC

## 2021-11-06 MED ORDER — CLOPIDOGREL BISULFATE 75 MG PO TABS: 75.0000 mg | ORAL_TABLET | Freq: Every day | ORAL | 3 refills | Status: AC

## 2021-11-06 MED ORDER — STUDY - OCEANIC-STROKE - ASUNDEXIAN 50 MG OR PLACEBO TABLET (PI-SETHI): 1.0000 | ORAL_TABLET | Freq: Every day | ORAL | 0 refills | Status: AC

## 2021-11-06 NOTE — TOC Transition Note (Signed)
Transition of Care [facility]) - CM/SW Discharge Note   Patient Details  Name: Eyan Gustaveson MRN: [id number] Date of Birth: 1950/05/24  Transition of Care [facility]) CM/SW Contact:  Baldemar Lenis, LCSW Phone Number: 11/06/2021, 2:42 PM   Clinical Narrative:   CSW confirmed with [facility] that patient has insurance approval to admit today. CSW updated MD and sent discharge information over to [facility]. Patient agreeable to transfer to SNF today.  Nurse to call report to [phone].    Final next level of care: Skilled Nursing Facility Barriers to Discharge: Barriers Resolved   Patient Goals and CMS Choice Patient states their goals for this hospitalization and ongoing recovery are:: get home CMS Medicare.gov Compare Post Acute Care list provided to:: Patient Choice offered to / list presented to : Patient  Discharge Placement              Patient chooses bed at:  [facility]) Patient to be transferred to facility by: [facility] Name of family member notified: Self Patient and family notified of of transfer: 11/06/21  Discharge Plan and Services     Post Acute Care Choice: Skilled Nursing Facility                               Social Determinants of Health (SDOH) Interventions     Readmission Risk Interventions     No data to display

## 2021-11-16 ENCOUNTER — Telehealth: Payer: Self-pay | Admitting: Neurology

## 2021-11-16 NOTE — Telephone Encounter (Signed)
LVM with [facility] scheduler asking someone to call back and r/s 9/18 appt- Dr. Pearlean Brownie out.

## 2021-11-26 ENCOUNTER — Encounter: Payer: Self-pay | Admitting: Internal Medicine

## 2021-11-26 ENCOUNTER — Ambulatory Visit (INDEPENDENT_AMBULATORY_CARE_PROVIDER_SITE_OTHER): Payer: PRIVATE HEALTH INSURANCE | Admitting: Internal Medicine

## 2021-11-26 ENCOUNTER — Other Ambulatory Visit: Payer: Self-pay

## 2021-11-26 VITALS — BP 100/66 | HR 80 | Temp 97.9°F

## 2021-11-26 DIAGNOSIS — M549 Dorsalgia, unspecified: Secondary | ICD-10-CM

## 2021-11-26 DIAGNOSIS — B9562 Methicillin resistant Staphylococcus aureus infection as the cause of diseases classified elsewhere: Secondary | ICD-10-CM

## 2021-11-26 DIAGNOSIS — R7881 Bacteremia: Secondary | ICD-10-CM

## 2021-11-26 DIAGNOSIS — A491 Streptococcal infection, unspecified site: Secondary | ICD-10-CM

## 2021-11-26 MED ORDER — KETOROLAC TROMETHAMINE 30 MG/ML IJ SOLN
30.0000 mg | Freq: Once | INTRAMUSCULAR | Status: AC
  Administered 2021-11-26: 30 mg via INTRAMUSCULAR

## 2021-11-26 NOTE — Patient Instructions (Signed)
Labs today   No antibiotics needed   See [place] again in 1-2 weeks for follow up on the back pain or sooner if it is persistent/progressive and you have fever/chill   Video visit is fine for follow up

## 2021-11-26 NOTE — Progress Notes (Signed)
[facility]  Patient Active Problem List   Diagnosis Date Noted   Bacteremia    Aspiration pneumonia (HCC) 10/27/2021   Erythrocytosis 10/27/2021   Hyponatremia 10/27/2021   Acute kidney injury (HCC) 10/27/2021   CVA (cerebral vascular accident) (HCC) 10/24/2021   Gout 12/09/2016   Essential hypertension 12/09/2016   Numbness and tingling of left arm and leg    TIA (transient ischemic attack) 12/08/2016   Rosacea 12/31/2013      Subjective:    Patient ID: Roberto [name], male    DOB: 1950/11/11, 71 y.o.   MRN: [id number]  Chief Complaint  Patient presents with   Follow-up    C/o severe back pain from sitting in wheelchair    HPI:  Mitchell Iwanicki is a 71 y.o. male here for f/u GAS and mrsa bacteremia hospital discharge   He was admitted 6/9-23 to [facility] initially with right PCA infarct, however with hospital onset sepsis on HD # 5 fond to have mssa and strep pyogenes bsa He had skin rash due to poison iv prior to admission and still present maceration/skin breakdown on admission so portal of entry thought to be that vs peripheral IV at the right antecubital fossa Rhae Hammock was negative  He was discharged on doxycycline until 7/12; he did get vanc 6/13-21 then linezolid till 6/28 then doxy to finish up course of tx 4 weeks for concern right 5th finger tenderness/deep tissue invovlement   No f/c Good appetite No malaise  In a snf getting pt/ot  Persistent left sided weakness  He reports normally without any pain but if he sits more than 1 hour in the wheel chair he gets left sided para-lumbar pain. Started last week but said he was left in the wheel chair too long this week a few times         No Known Allergies    Outpatient Medications Prior to Visit  Medication Sig Dispense Refill   acetaminophen (TYLENOL) 500 MG tablet Take 2 tablets (1,000 mg total) by mouth every 6 (six) hours as needed for headache or moderate pain  (Fever >/= 101). 30 tablet 0   aspirin 81 MG chewable tablet Chew 1 tablet (81 mg total) by mouth daily. 30 tablet 3   atorvastatin (LIPITOR) 80 MG tablet Take 1 tablet (80 mg total) by mouth daily. 30 tablet 3   calamine lotion Apply 1 Application topically 3 (three) times daily as needed for itching. 177 mL 0   clopidogrel (PLAVIX) 75 MG tablet Take 1 tablet (75 mg total) by mouth daily. 30 tablet 3   doxycycline (VIBRA-TABS) 100 MG tablet Take 1 tablet (100 mg total) by mouth every 12 (twelve) hours for 14 days. 28 tablet 0   lidocaine (LIDODERM) 5 % Place 1 patch onto the skin daily. Remove & Discard patch within 12 hours or as directed by MD 30 patch 0   meloxicam (MOBIC) 7.5 MG tablet Take 7.5 mg by mouth daily.     methocarbamol 1000 MG TABS Take 1,000 mg by mouth every 6 (six) hours as needed for muscle spasms (flank pain). 30 tablet 0   Study - OCEANIC-STROKE - asundexian 50 mg or placebo tablet (PI-Sethi) Take 1 tablet (50 mg total) by mouth daily. For Investigational Use Only. Take 1 tablet (50 mg) by mouth daily at the same time each day (preferably in the morning). Tablet should be swallowed whole with water; tablet can not be crushed  or broken. Any questions or concerns regarding this medication please contact [facility]. 98 tablet 0   celecoxib (CELEBREX) 200 MG capsule Take 200 mg by mouth daily.     losartan (COZAAR) 25 MG tablet Take 1 tablet (25 mg total) by mouth daily. 30 tablet 3   No facility-administered medications prior to visit.     Social History   Socioeconomic History   Marital status: Single    Spouse name: Not on file   Number of children: Not on file   Years of education: Not on file   Highest education level: Not on file  Occupational History   Not on file  Tobacco Use   Smoking status: Never   Smokeless tobacco: Never  Substance and Sexual Activity   Alcohol use: Not Currently    Alcohol/week: 1.0 standard drink of alcohol    Types:  1 Cans of beer per week    Comment: daily   Drug use: No   Sexual activity: Not on file  Other Topics Concern   Not on file  Social History Narrative   Not on file   Social Determinants of Health   Financial Resource Strain: Not on file  Food Insecurity: Not on file  Transportation Needs: Not on file  Physical Activity: Not on file  Stress: Not on file  Social Connections: Not on file  Intimate Partner Violence: Not on file      Review of Systems    All other ros negative  Objective:    BP 100/66   Pulse 80   Temp 97.9 F (36.6 C) (Oral)   SpO2 99%  Nursing note and vital signs reviewed.  Physical Exam     General/constitutional: moderate distress complaining left side back pain, pleasant; in wheel chair HEENT: Normocephalic, PER, Conj Clear, EOMI, Oropharynx clear Neck supple CV: rrr no mrg Lungs: clear to auscultation, normal respiratory effort Abd: Soft, Nontender Ext: no edema Skin: No Rash Neuro: left sided paresis MSK: left para-lumbar tenderness -- left sided paresis; right index finger no tender or swelling     Labs:  Micro:  Serology:  Imaging:  Assessment & Plan:   Problem List Items Addressed This Visit   None Visit Diagnoses     MRSA bacteremia    -  Primary   Relevant Orders   CBC   COMPLETE METABOLIC PANEL WITH GFR   C-reactive protein   Infection due to Streptococcus pyogenes       Relevant Orders   CBC   COMPLETE METABOLIC PANEL WITH GFR   C-reactive protein         No orders of the defined types were placed in this encounter.    Infection should have been well treated I am not sure what his intermittent left lower back pain is, he seems to described positional pain/muscle spasm on the left paralumbar area with prolonged sitting. No sign of sepsis  Will keep in the back-burner mrsa involvement complication potentially if this is persistent/progressive. Supportive care for now  Cbc/cmp/crp 15 mg I'm toradol  given today  Video visit 1-2 weeks to reassess or sooner if pain worse or fever/chill   Follow-up: No follow-ups on file.      Raymondo Band, MD [facility] 11/26/2021, 10:48 AM

## 2021-11-26 NOTE — Addendum Note (Signed)
Addended by: Linna Hoff D on: 11/26/2021 11:13 AM   Modules accepted: Orders

## 2021-11-27 LAB — CBC
HCT: 48.6 % (ref 38.5–50.0)
Hemoglobin: 16.9 g/dL (ref 13.2–17.1)
MCH: 31.2 pg (ref 27.0–33.0)
MCHC: 34.8 g/dL (ref 32.0–36.0)
MCV: 89.7 fL (ref 80.0–100.0)
MPV: 9.6 fL (ref 7.5–12.5)
Platelets: 277 10*3/uL (ref 140–400)
RBC: 5.42 10*6/uL (ref 4.20–5.80)
RDW: 12.2 % (ref 11.0–15.0)
WBC: 7 10*3/uL (ref 3.8–10.8)

## 2021-11-27 LAB — COMPLETE METABOLIC PANEL WITH GFR
AG Ratio: 0.8 (calc) — ABNORMAL LOW (ref 1.0–2.5)
ALT: 32 U/L (ref 9–46)
AST: 32 U/L (ref 10–35)
Albumin: 3.3 g/dL — ABNORMAL LOW (ref 3.6–5.1)
Alkaline phosphatase (APISO): 100 U/L (ref 35–144)
BUN: 12 mg/dL (ref 7–25)
CO2: 26 mmol/L (ref 20–32)
Calcium: 9.2 mg/dL (ref 8.6–10.3)
Chloride: 101 mmol/L (ref 98–110)
Creat: 0.79 mg/dL (ref 0.70–1.28)
Globulin: 3.9 g/dL (calc) — ABNORMAL HIGH (ref 1.9–3.7)
Glucose, Bld: 99 mg/dL (ref 65–99)
Potassium: 4.1 mmol/L (ref 3.5–5.3)
Sodium: 137 mmol/L (ref 135–146)
Total Bilirubin: 0.4 mg/dL (ref 0.2–1.2)
Total Protein: 7.2 g/dL (ref 6.1–8.1)
eGFR: 95 mL/min/{1.73_m2} (ref >=60)

## 2021-11-27 LAB — C-REACTIVE PROTEIN: CRP: 8.3 mg/L — ABNORMAL HIGH (ref <8.0)

## 2021-12-30 ENCOUNTER — Ambulatory Visit (HOSPITAL_BASED_OUTPATIENT_CLINIC_OR_DEPARTMENT_OTHER): Payer: PRIVATE HEALTH INSURANCE | Admitting: Cardiology

## 2022-01-29 ENCOUNTER — Inpatient Hospital Stay: Payer: No Typology Code available for payment source | Admitting: Neurology

## 2022-02-01 ENCOUNTER — Inpatient Hospital Stay: Payer: No Typology Code available for payment source | Admitting: Neurology

## 2022-02-17 ENCOUNTER — Inpatient Hospital Stay: Payer: No Typology Code available for payment source | Admitting: Neurology

## 2022-02-18 ENCOUNTER — Ambulatory Visit (INDEPENDENT_AMBULATORY_CARE_PROVIDER_SITE_OTHER): Payer: Medicare Other | Admitting: Neurology

## 2022-02-18 ENCOUNTER — Encounter: Payer: Self-pay | Admitting: Neurology

## 2022-02-18 VITALS — BP 122/79 | HR 82

## 2022-02-18 DIAGNOSIS — H53462 Homonymous bilateral field defects, left side: Secondary | ICD-10-CM | POA: Diagnosis not present

## 2022-02-18 DIAGNOSIS — G8194 Hemiplegia, unspecified affecting left nondominant side: Secondary | ICD-10-CM

## 2022-02-18 DIAGNOSIS — I69354 Hemiplegia and hemiparesis following cerebral infarction affecting left non-dominant side: Secondary | ICD-10-CM | POA: Diagnosis not present

## 2022-02-18 DIAGNOSIS — I63531 Cerebral infarction due to unspecified occlusion or stenosis of right posterior cerebral artery: Secondary | ICD-10-CM | POA: Diagnosis not present

## 2022-02-18 DIAGNOSIS — I639 Cerebral infarction, unspecified: Secondary | ICD-10-CM

## 2022-02-18 NOTE — Progress Notes (Signed)
[facility]  PATIENT: Roberto [name] DOB: 1951-03-21  REQUESTING CLINICIAN: Rosalin Hawking, MD HISTORY FROM: Patient and chart review  REASON FOR VISIT: Follow up right PCA stroke   HISTORICAL  CHIEF COMPLAINT:  Chief Complaint  Patient presents with   New Patient (Initial Visit)    Rm 13 here for hospital follow up. Pt went to ER on 10/23/2021 for CVA.Since D/C pt reports left side deficits are still noted. Left hand stregenth has not returned to baseline and intermittent numbness on the left side of the face. He is currently with [facility] and will be d/c Monday. He plans to return to [place] once d/c.     HISTORY OF PRESENT ILLNESS:  Roberto [name] is a 71 year old gentleman past medical history of hypertension hyperlipidemia who is presenting for follow-up after being admitted for stroke in June.  Patient did have a right PCA stroke, etiology likely large vessel disease versus cardioembolic.  He was stable and discharged to rehab where he is doing physical therapy.  He stated with physical therapy, he is able to stand up and walk with a cane/walker but due to not having enough funds he is set to be release Monday.  He report upon release he is planning to move back home to [place].  He is on aspirin and Plavix, also on losartan and atorvastatin.  He is tolerating this medication very well and he continues to improve.  He denies any fall. He denies having a cardiac monitor.    Hospital course  Patient presented with altered sensation on the L side of his body with instability while standing due to mild L sided weakness. On arrival a CT head showed no acute intracranial process and CTA head/neck showed 75% stenosis of proximal L ICA without other hemodynamically significant stenosis in the neck, patent proximal intracranial vasculature, and decreased perfusion in the bilateral cerebral and cerebellar hemispheres. MRI brain was obtained and showed interval worsening and  expansion of previously identified right PCA distribution infarct, now fairly large in and involving the majority of the right PCA distribution with minimal associated petechial blood products without frank hemorrhagic transformation and no other new acute intracranial abnormality. On HD1 he was noted to have L homonymous hemianopsia, pronator drift on the R, and persistent L sided sensory deficits. He had further decreases in his strength, mostly affecting the LUE. A STAT CT head was obtained in light of these exam findings which showed known acute infarction in the right PCA distribution and possibly more confluent involvement in the right occipital cortex than on the preceding brain MRI, without hemorrhagic conversion or new territory distribution. Unfortunately overnight 06/13 he had new mental status and neurological exam changes prompting repeat STAT CT head which was negative for hemorrhage but did show increased size of evolving subacute stroke of the right PCA distribution. CTA head at this time showed occlusion of proximal R P2 segment, moderate proximal L P2 stenosis, mild-moderate atheromatous change about the carotid bifurcations and carotid siphons. He had full neglect of RUE for a few days but ultimately regained some movement and strength to the RUE.   Further risk factor work-up revealed HbA1c 5.8%, lipid panel with elevated triglycerides 272 and LDL of 52. Echocardiogram showed LF EF 60-65% with normal function and no wall motion abnormalities. He was started on aspirin and plavix as well as high-intensity statin. He was also enrolled in the [facility] clinical trial.    OTHER MEDICAL CONDITIONS: Hypertension, hyperlipidemia   REVIEW OF  SYSTEMS: Full 14 system review of systems performed and negative with exception of: As noted in the HPI   ALLERGIES: No Known Allergies  HOME MEDICATIONS: Outpatient Medications Prior to Visit  Medication Sig Dispense Refill   acetaminophen (TYLENOL)  500 MG tablet Take 2 tablets (1,000 mg total) by mouth every 6 (six) hours as needed for headache or moderate pain (Fever >/= 101). 30 tablet 0   aspirin 81 MG chewable tablet Chew 1 tablet (81 mg total) by mouth daily. 30 tablet 3   atorvastatin (LIPITOR) 80 MG tablet Take 1 tablet (80 mg total) by mouth daily. 30 tablet 3   calamine lotion Apply 1 Application topically 3 (three) times daily as needed for itching. 177 mL 0   celecoxib (CELEBREX) 200 MG capsule Take 200 mg by mouth daily.     clopidogrel (PLAVIX) 75 MG tablet Take 1 tablet (75 mg total) by mouth daily. 30 tablet 3   lidocaine (LIDODERM) 5 % Place 1 patch onto the skin daily. Remove & Discard patch within 12 hours or as directed by MD 30 patch 0   meloxicam (MOBIC) 7.5 MG tablet Take 7.5 mg by mouth daily.     methocarbamol 1000 MG TABS Take 1,000 mg by mouth every 6 (six) hours as needed for muscle spasms (flank pain). 30 tablet 0   Study - OCEANIC-STROKE - asundexian 50 mg or placebo tablet (PI-Sethi) Take 1 tablet (50 mg total) by mouth daily. For Investigational Use Only. Take 1 tablet (50 mg) by mouth daily at the same time each day (preferably in the morning). Tablet should be swallowed whole with water; tablet can not be crushed or broken. Any questions or concerns regarding this medication please contact [facility]. 98 tablet 0   losartan (COZAAR) 25 MG tablet Take 1 tablet (25 mg total) by mouth daily. 30 tablet 3   No facility-administered medications prior to visit.    PAST MEDICAL HISTORY: Past Medical History:  Diagnosis Date   Anemia    Glaucoma    Hypertension     PAST SURGICAL HISTORY: Past Surgical History:  Procedure Laterality Date   BUBBLE STUDY  10/30/2021   Procedure: BUBBLE STUDY;  Surgeon: Jodelle Red, MD;  Location: [facility] ENDOSCOPY;  Service: Cardiovascular;;   HERNIA REPAIR     TEE WITHOUT CARDIOVERSION N/A 10/30/2021   Procedure: TRANSESOPHAGEAL ECHOCARDIOGRAM (TEE);   Surgeon: Jodelle Red, MD;  Location: [facility] ENDOSCOPY;  Service: Cardiovascular;  Laterality: N/A;    FAMILY HISTORY: Family History  Problem Relation Age of Onset   Heart disease Mother    Stroke Father     SOCIAL HISTORY: Social History   Socioeconomic History   Marital status: Single    Spouse name: Not on file   Number of children: Not on file   Years of education: Not on file   Highest education level: Not on file  Occupational History   Not on file  Tobacco Use   Smoking status: Never   Smokeless tobacco: Never  Substance and Sexual Activity   Alcohol use: Not Currently    Alcohol/week: 1.0 standard drink of alcohol    Types: 1 Cans of beer per week    Comment: daily   Drug use: No   Sexual activity: Not on file  Other Topics Concern   Not on file  Social History Narrative   Not on file   Social Determinants of Health   Financial Resource Strain: Not on file  Food Insecurity: Not  on file  Transportation Needs: Not on file  Physical Activity: Not on file  Stress: Not on file  Social Connections: Not on file  Intimate Partner Violence: Not on file    PHYSICAL EXAM  GENERAL EXAM/CONSTITUTIONAL: Vitals:  Vitals:   02/18/22 1111  BP: 122/79  Pulse: 82   There is no height or weight on file to calculate BMI. Wt Readings from Last 3 Encounters:  10/24/21 169 lb 15.6 oz (77.1 kg)  12/09/16 192 lb 3.2 oz (87.2 kg)  11/11/15 186 lb (84.4 kg)   Patient is in no distress; well developed, nourished and groomed; neck is supple  EYES: Pupils round and reactive to light, There is left visual field cut. Extraocular movements intacts,   MUSCULOSKELETAL: Gait, strength, tone, movements noted in Neurologic exam below  NEUROLOGIC: MENTAL STATUS:      No data to display         awake, alert, oriented to person, place and time recent and remote memory intact normal attention and concentration language fluent, comprehension intact, naming  intact fund of knowledge appropriate  CRANIAL NERVE:  2nd, 3rd, 4th, 6th - pupils equal and reactive to light, there is left homonymous hemianopsia, extraocular muscles intact, no nystagmus 5th - mild decrease sensation to left face 7th - facial strength symmetric 8th - hearing intact 9th - palate elevates symmetrically, uvula midline 11th - shoulder shrug symmetric 12th - tongue protrusion midline  MOTOR:  LUE shoulder abduction 1-2/3, Elbow flexion/extension 2-3/5, finger intrinsics 4/5. LLE 4/5 throughout   SENSORY:  Decrease sensation to light touch to upper and lower extremity. Extinction to double tactile stimulus on the left   COORDINATION:  finger-nose-finger normal on the right and unable to do on the left due to weakness.   REFLEXES:  deep tendon reflexes present and brisk  GAIT/STATION:  Able to stand with assistance and unable to walk.    DIAGNOSTIC DATA (LABS, IMAGING, TESTING) - I reviewed patient records, labs, notes, testing and imaging myself where available.  Lab Results  Component Value Date   WBC 7.0 11/26/2021   HGB 16.9 11/26/2021   HCT 48.6 11/26/2021   MCV 89.7 11/26/2021   PLT 277 11/26/2021      Component Value Date/Time   NA 137 11/26/2021 1107   K 4.1 11/26/2021 1107   CL 101 11/26/2021 1107   CO2 26 11/26/2021 1107   GLUCOSE 99 11/26/2021 1107   BUN 12 11/26/2021 1107   CREATININE 0.79 11/26/2021 1107   CALCIUM 9.2 11/26/2021 1107   PROT 7.2 11/26/2021 1107   ALBUMIN 3.2 (L) 10/27/2021 0353   AST 32 11/26/2021 1107   ALT 32 11/26/2021 1107   ALKPHOS 63 10/27/2021 0353   BILITOT 0.4 11/26/2021 1107   GFRNONAA >60 11/06/2021 0040   GFRAA >60 12/08/2016 2049   Lab Results  Component Value Date   CHOL 173 10/23/2021   HDL 67 10/23/2021   LDLCALC 52 10/23/2021   TRIG 272 (H) 10/23/2021   CHOLHDL 2.6 10/23/2021   Lab Results  Component Value Date   HGBA1C 5.8 (H) 10/23/2021   No results found for: "VITAMINB12" Lab Results   Component Value Date   TSH 2.25 11/11/2015    MRI Brain 10/27/21 1. Motion degraded exam. 2. Interval worsening and expansion of previously identified right PCA distribution infarct, now fairly large in and involving the majority of the right PCA distribution. Minimal associated petechial blood products without frank hemorrhagic transformation. 3. No other new acute intracranial  abnormality.    ASSESSMENT AND PLAN  71 y.o. year old male with history of hypertension hyperlipidemia who is presenting for follow-up after admission for right PCA stroke. Stroke etiology likely large vessel disease versus cardioembolic.  He is on aspirin and Plavix, and a statin. He reports that cardiac monitor was not completed.  Currently he is at rehab but he is set to be released on Monday due to not having enough funds to pay for his rehab and patient reports that he is planning to move to [place] to live with his brothers.  At this time, we will recommend patient to continue current medications and to set up care with a neurologist when he moves to [place].  He will still need to be monitor with a cardiac event monitor for 30 days to rule out atrial fibrillation.  This was explained to the patient and he is comfortable with plans.  We still recommend continue with physical therapy as he does have a left homonymous hemianopsia, left sensory deficit and left-sided weakness.   1. Cerebrovascular accident (CVA) due to occlusion of right posterior cerebral artery (HCC)   2. Left homonymous hemianopsia   3. Hemiparesis affecting left side as late effect of cerebrovascular accident (HCC)   4. Left hemiplegia [facility])      Patient Instructions  Continue with aspirin Plavix Continue with atorvastatin Continue with aggressive physical therapy Set up care with a neurologist once you moved to [place] You will still need to have a 30-day cardiac monitor to rule out atrial fibrillation In the case that you are not  moving to [place], follow-up in 6 months.  No orders of the defined types were placed in this encounter.   No orders of the defined types were placed in this encounter.   Return in about 1 year (around 02/19/2023).    Windell Norfolk, MD 02/19/2022, 11:43 AM  [facility] [address] [place], [place] [address] [phone]

## 2022-02-19 NOTE — Patient Instructions (Signed)
Continue with aspirin Plavix Continue with atorvastatin Continue with aggressive physical therapy Set up care with a neurologist once you moved to [place] will still need to have a 30-day cardiac monitor to rule out atrial fibrillation In the case that you are not moving to [place], follow-up in 6 months.

## 2022-05-18 ENCOUNTER — Telehealth: Payer: Self-pay | Admitting: Neurology

## 2022-05-18 NOTE — Telephone Encounter (Signed)
After checking DPR Phone rep called pt to confirm if the pt is moving or has moved , hie DPR is not stating that a vm could be left.

## 2022-05-18 NOTE — Telephone Encounter (Signed)
PT Roberto [name] is asking for a Rx to be sent to whatever medical supplier for a manual wheelchair.  The one pt has from nursing home does not have working breaks on it. PT Cecelia's vm is secure @ [phone]

## 2022-05-18 NOTE — Telephone Encounter (Signed)
Per last office note, pt is moving to [place] and will be establishing care there once moved.  Could we call the pt and verify if he is moving or has moved?

## 2022-05-19 NOTE — Telephone Encounter (Signed)
Thanks for the update, please try and call today. Thanks!

## 2022-05-20 NOTE — Telephone Encounter (Signed)
Noted will wait to hear back from pt at this point.

## 2022-05-20 NOTE — Telephone Encounter (Signed)
Phone rep called pt to confirm if the pt is moving or has moved , the DPR is not stating that a vm could be left. This is 2nd attempt.

## 2022-06-01 DIAGNOSIS — E785 Hyperlipidemia, unspecified: Secondary | ICD-10-CM | POA: Diagnosis not present

## 2022-06-01 DIAGNOSIS — Z9181 History of falling: Secondary | ICD-10-CM | POA: Diagnosis not present

## 2022-06-01 DIAGNOSIS — H53462 Homonymous bilateral field defects, left side: Secondary | ICD-10-CM | POA: Diagnosis not present

## 2022-06-01 DIAGNOSIS — Z7902 Long term (current) use of antithrombotics/antiplatelets: Secondary | ICD-10-CM | POA: Diagnosis not present

## 2022-06-01 DIAGNOSIS — I10 Essential (primary) hypertension: Secondary | ICD-10-CM | POA: Diagnosis not present

## 2022-06-01 DIAGNOSIS — I69354 Hemiplegia and hemiparesis following cerebral infarction affecting left non-dominant side: Secondary | ICD-10-CM | POA: Diagnosis not present

## 2022-06-01 DIAGNOSIS — Z7982 Long term (current) use of aspirin: Secondary | ICD-10-CM | POA: Diagnosis not present

## 2022-06-01 DIAGNOSIS — H409 Unspecified glaucoma: Secondary | ICD-10-CM | POA: Diagnosis not present

## 2022-06-01 DIAGNOSIS — Z791 Long term (current) use of non-steroidal anti-inflammatories (NSAID): Secondary | ICD-10-CM | POA: Diagnosis not present

## 2022-06-04 DIAGNOSIS — I69354 Hemiplegia and hemiparesis following cerebral infarction affecting left non-dominant side: Secondary | ICD-10-CM | POA: Diagnosis not present

## 2022-06-04 DIAGNOSIS — H409 Unspecified glaucoma: Secondary | ICD-10-CM | POA: Diagnosis not present

## 2022-06-04 DIAGNOSIS — Z791 Long term (current) use of non-steroidal anti-inflammatories (NSAID): Secondary | ICD-10-CM | POA: Diagnosis not present

## 2022-06-04 DIAGNOSIS — H53462 Homonymous bilateral field defects, left side: Secondary | ICD-10-CM | POA: Diagnosis not present

## 2022-06-04 DIAGNOSIS — Z7902 Long term (current) use of antithrombotics/antiplatelets: Secondary | ICD-10-CM | POA: Diagnosis not present

## 2022-06-04 DIAGNOSIS — Z7982 Long term (current) use of aspirin: Secondary | ICD-10-CM | POA: Diagnosis not present

## 2022-06-04 DIAGNOSIS — Z9181 History of falling: Secondary | ICD-10-CM | POA: Diagnosis not present

## 2022-06-04 DIAGNOSIS — I10 Essential (primary) hypertension: Secondary | ICD-10-CM | POA: Diagnosis not present

## 2022-06-04 DIAGNOSIS — E785 Hyperlipidemia, unspecified: Secondary | ICD-10-CM | POA: Diagnosis not present

## 2022-07-19 DIAGNOSIS — Z7982 Long term (current) use of aspirin: Secondary | ICD-10-CM | POA: Diagnosis not present

## 2022-07-19 DIAGNOSIS — Z7902 Long term (current) use of antithrombotics/antiplatelets: Secondary | ICD-10-CM | POA: Diagnosis not present

## 2022-07-19 DIAGNOSIS — E785 Hyperlipidemia, unspecified: Secondary | ICD-10-CM | POA: Diagnosis not present

## 2022-07-19 DIAGNOSIS — I69354 Hemiplegia and hemiparesis following cerebral infarction affecting left non-dominant side: Secondary | ICD-10-CM | POA: Diagnosis not present

## 2022-07-19 DIAGNOSIS — Z9181 History of falling: Secondary | ICD-10-CM | POA: Diagnosis not present

## 2022-07-19 DIAGNOSIS — I10 Essential (primary) hypertension: Secondary | ICD-10-CM | POA: Diagnosis not present

## 2022-07-19 DIAGNOSIS — H53462 Homonymous bilateral field defects, left side: Secondary | ICD-10-CM | POA: Diagnosis not present

## 2022-07-19 DIAGNOSIS — H409 Unspecified glaucoma: Secondary | ICD-10-CM | POA: Diagnosis not present

## 2022-07-19 DIAGNOSIS — Z791 Long term (current) use of non-steroidal anti-inflammatories (NSAID): Secondary | ICD-10-CM | POA: Diagnosis not present

## 2022-07-20 DIAGNOSIS — H409 Unspecified glaucoma: Secondary | ICD-10-CM | POA: Diagnosis not present

## 2022-07-20 DIAGNOSIS — I10 Essential (primary) hypertension: Secondary | ICD-10-CM | POA: Diagnosis not present

## 2022-07-20 DIAGNOSIS — I69354 Hemiplegia and hemiparesis following cerebral infarction affecting left non-dominant side: Secondary | ICD-10-CM | POA: Diagnosis not present

## 2022-07-20 DIAGNOSIS — Z7902 Long term (current) use of antithrombotics/antiplatelets: Secondary | ICD-10-CM | POA: Diagnosis not present

## 2022-07-20 DIAGNOSIS — Z9181 History of falling: Secondary | ICD-10-CM | POA: Diagnosis not present

## 2022-07-20 DIAGNOSIS — Z7982 Long term (current) use of aspirin: Secondary | ICD-10-CM | POA: Diagnosis not present

## 2022-07-20 DIAGNOSIS — H53462 Homonymous bilateral field defects, left side: Secondary | ICD-10-CM | POA: Diagnosis not present

## 2022-07-20 DIAGNOSIS — E785 Hyperlipidemia, unspecified: Secondary | ICD-10-CM | POA: Diagnosis not present

## 2022-07-20 DIAGNOSIS — Z791 Long term (current) use of non-steroidal anti-inflammatories (NSAID): Secondary | ICD-10-CM | POA: Diagnosis not present

## 2022-07-21 ENCOUNTER — Telehealth: Payer: Self-pay | Admitting: Neurology

## 2022-07-21 DIAGNOSIS — I10 Essential (primary) hypertension: Secondary | ICD-10-CM | POA: Diagnosis not present

## 2022-07-21 DIAGNOSIS — Z791 Long term (current) use of non-steroidal anti-inflammatories (NSAID): Secondary | ICD-10-CM | POA: Diagnosis not present

## 2022-07-21 DIAGNOSIS — Z7902 Long term (current) use of antithrombotics/antiplatelets: Secondary | ICD-10-CM | POA: Diagnosis not present

## 2022-07-21 DIAGNOSIS — H409 Unspecified glaucoma: Secondary | ICD-10-CM | POA: Diagnosis not present

## 2022-07-21 DIAGNOSIS — Z9181 History of falling: Secondary | ICD-10-CM | POA: Diagnosis not present

## 2022-07-21 DIAGNOSIS — Z7982 Long term (current) use of aspirin: Secondary | ICD-10-CM | POA: Diagnosis not present

## 2022-07-21 DIAGNOSIS — I69354 Hemiplegia and hemiparesis following cerebral infarction affecting left non-dominant side: Secondary | ICD-10-CM | POA: Diagnosis not present

## 2022-07-21 DIAGNOSIS — H53462 Homonymous bilateral field defects, left side: Secondary | ICD-10-CM | POA: Diagnosis not present

## 2022-07-21 DIAGNOSIS — E785 Hyperlipidemia, unspecified: Secondary | ICD-10-CM | POA: Diagnosis not present

## 2022-07-21 NOTE — Telephone Encounter (Signed)
PT [facility] has called for verbal orders for PT 1 week 4 and [facility] 1 week 4, her call back# is [phone] vm secure.

## 2022-07-21 NOTE — Telephone Encounter (Signed)
Called and relayed verbal orders as requested.

## 2022-07-26 DIAGNOSIS — H53462 Homonymous bilateral field defects, left side: Secondary | ICD-10-CM | POA: Diagnosis not present

## 2022-07-26 DIAGNOSIS — E785 Hyperlipidemia, unspecified: Secondary | ICD-10-CM | POA: Diagnosis not present

## 2022-07-26 DIAGNOSIS — Z7902 Long term (current) use of antithrombotics/antiplatelets: Secondary | ICD-10-CM | POA: Diagnosis not present

## 2022-07-26 DIAGNOSIS — Z9181 History of falling: Secondary | ICD-10-CM | POA: Diagnosis not present

## 2022-07-26 DIAGNOSIS — Z7982 Long term (current) use of aspirin: Secondary | ICD-10-CM | POA: Diagnosis not present

## 2022-07-26 DIAGNOSIS — I10 Essential (primary) hypertension: Secondary | ICD-10-CM | POA: Diagnosis not present

## 2022-07-26 DIAGNOSIS — Z791 Long term (current) use of non-steroidal anti-inflammatories (NSAID): Secondary | ICD-10-CM | POA: Diagnosis not present

## 2022-07-26 DIAGNOSIS — I69354 Hemiplegia and hemiparesis following cerebral infarction affecting left non-dominant side: Secondary | ICD-10-CM | POA: Diagnosis not present

## 2022-07-26 DIAGNOSIS — H409 Unspecified glaucoma: Secondary | ICD-10-CM | POA: Diagnosis not present

## 2022-08-03 DIAGNOSIS — Z7902 Long term (current) use of antithrombotics/antiplatelets: Secondary | ICD-10-CM | POA: Diagnosis not present

## 2022-08-03 DIAGNOSIS — H53462 Homonymous bilateral field defects, left side: Secondary | ICD-10-CM | POA: Diagnosis not present

## 2022-08-03 DIAGNOSIS — Z7982 Long term (current) use of aspirin: Secondary | ICD-10-CM | POA: Diagnosis not present

## 2022-08-03 DIAGNOSIS — H409 Unspecified glaucoma: Secondary | ICD-10-CM | POA: Diagnosis not present

## 2022-08-03 DIAGNOSIS — E785 Hyperlipidemia, unspecified: Secondary | ICD-10-CM | POA: Diagnosis not present

## 2022-08-03 DIAGNOSIS — I69354 Hemiplegia and hemiparesis following cerebral infarction affecting left non-dominant side: Secondary | ICD-10-CM | POA: Diagnosis not present

## 2022-08-03 DIAGNOSIS — I10 Essential (primary) hypertension: Secondary | ICD-10-CM | POA: Diagnosis not present

## 2022-08-03 DIAGNOSIS — D649 Anemia, unspecified: Secondary | ICD-10-CM | POA: Diagnosis not present

## 2022-08-04 NOTE — Telephone Encounter (Signed)
Orders faxed to [facility]  

## 2022-08-05 DIAGNOSIS — I69354 Hemiplegia and hemiparesis following cerebral infarction affecting left non-dominant side: Secondary | ICD-10-CM | POA: Diagnosis not present

## 2022-08-05 DIAGNOSIS — E785 Hyperlipidemia, unspecified: Secondary | ICD-10-CM | POA: Diagnosis not present

## 2022-08-05 DIAGNOSIS — H409 Unspecified glaucoma: Secondary | ICD-10-CM | POA: Diagnosis not present

## 2022-08-05 DIAGNOSIS — Z7902 Long term (current) use of antithrombotics/antiplatelets: Secondary | ICD-10-CM | POA: Diagnosis not present

## 2022-08-05 DIAGNOSIS — H53462 Homonymous bilateral field defects, left side: Secondary | ICD-10-CM | POA: Diagnosis not present

## 2022-08-05 DIAGNOSIS — D649 Anemia, unspecified: Secondary | ICD-10-CM | POA: Diagnosis not present

## 2022-08-05 DIAGNOSIS — I10 Essential (primary) hypertension: Secondary | ICD-10-CM | POA: Diagnosis not present

## 2022-08-05 DIAGNOSIS — Z7982 Long term (current) use of aspirin: Secondary | ICD-10-CM | POA: Diagnosis not present

## 2022-08-06 DIAGNOSIS — D649 Anemia, unspecified: Secondary | ICD-10-CM | POA: Diagnosis not present

## 2022-08-06 DIAGNOSIS — I69354 Hemiplegia and hemiparesis following cerebral infarction affecting left non-dominant side: Secondary | ICD-10-CM | POA: Diagnosis not present

## 2022-08-06 DIAGNOSIS — Z7982 Long term (current) use of aspirin: Secondary | ICD-10-CM | POA: Diagnosis not present

## 2022-08-06 DIAGNOSIS — E785 Hyperlipidemia, unspecified: Secondary | ICD-10-CM | POA: Diagnosis not present

## 2022-08-06 DIAGNOSIS — H53462 Homonymous bilateral field defects, left side: Secondary | ICD-10-CM | POA: Diagnosis not present

## 2022-08-06 DIAGNOSIS — H409 Unspecified glaucoma: Secondary | ICD-10-CM | POA: Diagnosis not present

## 2022-08-06 DIAGNOSIS — I10 Essential (primary) hypertension: Secondary | ICD-10-CM | POA: Diagnosis not present

## 2022-08-06 DIAGNOSIS — Z7902 Long term (current) use of antithrombotics/antiplatelets: Secondary | ICD-10-CM | POA: Diagnosis not present

## 2022-08-09 DIAGNOSIS — H53462 Homonymous bilateral field defects, left side: Secondary | ICD-10-CM | POA: Diagnosis not present

## 2022-08-09 DIAGNOSIS — I69354 Hemiplegia and hemiparesis following cerebral infarction affecting left non-dominant side: Secondary | ICD-10-CM | POA: Diagnosis not present

## 2022-08-09 DIAGNOSIS — D649 Anemia, unspecified: Secondary | ICD-10-CM | POA: Diagnosis not present

## 2022-08-09 DIAGNOSIS — Z7982 Long term (current) use of aspirin: Secondary | ICD-10-CM | POA: Diagnosis not present

## 2022-08-09 DIAGNOSIS — E785 Hyperlipidemia, unspecified: Secondary | ICD-10-CM | POA: Diagnosis not present

## 2022-08-09 DIAGNOSIS — H409 Unspecified glaucoma: Secondary | ICD-10-CM | POA: Diagnosis not present

## 2022-08-09 DIAGNOSIS — I10 Essential (primary) hypertension: Secondary | ICD-10-CM | POA: Diagnosis not present

## 2022-08-09 DIAGNOSIS — Z7902 Long term (current) use of antithrombotics/antiplatelets: Secondary | ICD-10-CM | POA: Diagnosis not present

## 2022-08-13 DIAGNOSIS — Z7902 Long term (current) use of antithrombotics/antiplatelets: Secondary | ICD-10-CM | POA: Diagnosis not present

## 2022-08-13 DIAGNOSIS — I10 Essential (primary) hypertension: Secondary | ICD-10-CM | POA: Diagnosis not present

## 2022-08-13 DIAGNOSIS — Z7982 Long term (current) use of aspirin: Secondary | ICD-10-CM | POA: Diagnosis not present

## 2022-08-13 DIAGNOSIS — H53462 Homonymous bilateral field defects, left side: Secondary | ICD-10-CM | POA: Diagnosis not present

## 2022-08-13 DIAGNOSIS — I69354 Hemiplegia and hemiparesis following cerebral infarction affecting left non-dominant side: Secondary | ICD-10-CM | POA: Diagnosis not present

## 2022-08-13 DIAGNOSIS — D649 Anemia, unspecified: Secondary | ICD-10-CM | POA: Diagnosis not present

## 2022-08-13 DIAGNOSIS — H409 Unspecified glaucoma: Secondary | ICD-10-CM | POA: Diagnosis not present

## 2022-08-13 DIAGNOSIS — E785 Hyperlipidemia, unspecified: Secondary | ICD-10-CM | POA: Diagnosis not present

## 2022-08-16 DIAGNOSIS — I69354 Hemiplegia and hemiparesis following cerebral infarction affecting left non-dominant side: Secondary | ICD-10-CM | POA: Diagnosis not present

## 2022-08-16 DIAGNOSIS — I10 Essential (primary) hypertension: Secondary | ICD-10-CM | POA: Diagnosis not present

## 2022-08-16 DIAGNOSIS — Z7902 Long term (current) use of antithrombotics/antiplatelets: Secondary | ICD-10-CM | POA: Diagnosis not present

## 2022-08-16 DIAGNOSIS — E785 Hyperlipidemia, unspecified: Secondary | ICD-10-CM | POA: Diagnosis not present

## 2022-08-16 DIAGNOSIS — D649 Anemia, unspecified: Secondary | ICD-10-CM | POA: Diagnosis not present

## 2022-08-16 DIAGNOSIS — H53462 Homonymous bilateral field defects, left side: Secondary | ICD-10-CM | POA: Diagnosis not present

## 2022-08-16 DIAGNOSIS — H409 Unspecified glaucoma: Secondary | ICD-10-CM | POA: Diagnosis not present

## 2022-08-16 DIAGNOSIS — Z7982 Long term (current) use of aspirin: Secondary | ICD-10-CM | POA: Diagnosis not present

## 2022-08-19 DIAGNOSIS — E785 Hyperlipidemia, unspecified: Secondary | ICD-10-CM | POA: Diagnosis not present

## 2022-08-19 DIAGNOSIS — Z7902 Long term (current) use of antithrombotics/antiplatelets: Secondary | ICD-10-CM | POA: Diagnosis not present

## 2022-08-19 DIAGNOSIS — Z7982 Long term (current) use of aspirin: Secondary | ICD-10-CM | POA: Diagnosis not present

## 2022-08-19 DIAGNOSIS — I69354 Hemiplegia and hemiparesis following cerebral infarction affecting left non-dominant side: Secondary | ICD-10-CM | POA: Diagnosis not present

## 2022-08-19 DIAGNOSIS — D649 Anemia, unspecified: Secondary | ICD-10-CM | POA: Diagnosis not present

## 2022-08-19 DIAGNOSIS — H53462 Homonymous bilateral field defects, left side: Secondary | ICD-10-CM | POA: Diagnosis not present

## 2022-08-19 DIAGNOSIS — I10 Essential (primary) hypertension: Secondary | ICD-10-CM | POA: Diagnosis not present

## 2022-08-19 DIAGNOSIS — H409 Unspecified glaucoma: Secondary | ICD-10-CM | POA: Diagnosis not present

## 2022-08-24 DIAGNOSIS — H409 Unspecified glaucoma: Secondary | ICD-10-CM | POA: Diagnosis not present

## 2022-08-24 DIAGNOSIS — Z7902 Long term (current) use of antithrombotics/antiplatelets: Secondary | ICD-10-CM | POA: Diagnosis not present

## 2022-08-24 DIAGNOSIS — D649 Anemia, unspecified: Secondary | ICD-10-CM | POA: Diagnosis not present

## 2022-08-24 DIAGNOSIS — Z7982 Long term (current) use of aspirin: Secondary | ICD-10-CM | POA: Diagnosis not present

## 2022-08-24 DIAGNOSIS — E785 Hyperlipidemia, unspecified: Secondary | ICD-10-CM | POA: Diagnosis not present

## 2022-08-24 DIAGNOSIS — I10 Essential (primary) hypertension: Secondary | ICD-10-CM | POA: Diagnosis not present

## 2022-08-24 DIAGNOSIS — H53462 Homonymous bilateral field defects, left side: Secondary | ICD-10-CM | POA: Diagnosis not present

## 2022-08-24 DIAGNOSIS — I69354 Hemiplegia and hemiparesis following cerebral infarction affecting left non-dominant side: Secondary | ICD-10-CM | POA: Diagnosis not present

## 2022-08-31 DIAGNOSIS — I10 Essential (primary) hypertension: Secondary | ICD-10-CM | POA: Diagnosis not present

## 2022-08-31 DIAGNOSIS — H409 Unspecified glaucoma: Secondary | ICD-10-CM | POA: Diagnosis not present

## 2022-08-31 DIAGNOSIS — D649 Anemia, unspecified: Secondary | ICD-10-CM | POA: Diagnosis not present

## 2022-08-31 DIAGNOSIS — Z7982 Long term (current) use of aspirin: Secondary | ICD-10-CM | POA: Diagnosis not present

## 2022-08-31 DIAGNOSIS — I69354 Hemiplegia and hemiparesis following cerebral infarction affecting left non-dominant side: Secondary | ICD-10-CM | POA: Diagnosis not present

## 2022-08-31 DIAGNOSIS — H53462 Homonymous bilateral field defects, left side: Secondary | ICD-10-CM | POA: Diagnosis not present

## 2022-08-31 DIAGNOSIS — E785 Hyperlipidemia, unspecified: Secondary | ICD-10-CM | POA: Diagnosis not present

## 2022-08-31 DIAGNOSIS — Z7902 Long term (current) use of antithrombotics/antiplatelets: Secondary | ICD-10-CM | POA: Diagnosis not present

## 2022-09-06 DIAGNOSIS — H53462 Homonymous bilateral field defects, left side: Secondary | ICD-10-CM | POA: Diagnosis not present

## 2022-09-06 DIAGNOSIS — H409 Unspecified glaucoma: Secondary | ICD-10-CM | POA: Diagnosis not present

## 2022-09-06 DIAGNOSIS — Z7982 Long term (current) use of aspirin: Secondary | ICD-10-CM | POA: Diagnosis not present

## 2022-09-06 DIAGNOSIS — E785 Hyperlipidemia, unspecified: Secondary | ICD-10-CM | POA: Diagnosis not present

## 2022-09-06 DIAGNOSIS — Z7902 Long term (current) use of antithrombotics/antiplatelets: Secondary | ICD-10-CM | POA: Diagnosis not present

## 2022-09-06 DIAGNOSIS — I69354 Hemiplegia and hemiparesis following cerebral infarction affecting left non-dominant side: Secondary | ICD-10-CM | POA: Diagnosis not present

## 2022-09-06 DIAGNOSIS — D649 Anemia, unspecified: Secondary | ICD-10-CM | POA: Diagnosis not present

## 2022-09-06 DIAGNOSIS — I10 Essential (primary) hypertension: Secondary | ICD-10-CM | POA: Diagnosis not present

## 2022-09-16 ENCOUNTER — Telehealth: Payer: Self-pay

## 2022-09-16 NOTE — Telephone Encounter (Signed)
Order faxed to [phone]

## 2022-11-30 ENCOUNTER — Emergency Department (HOSPITAL_COMMUNITY): Payer: Medicare (Managed Care)

## 2022-11-30 ENCOUNTER — Encounter (HOSPITAL_COMMUNITY): Payer: Self-pay

## 2022-11-30 ENCOUNTER — Other Ambulatory Visit: Payer: Self-pay

## 2022-11-30 ENCOUNTER — Emergency Department (HOSPITAL_COMMUNITY)
Admission: EM | Admit: 2022-11-30 | Discharge: 2022-12-01 | Disposition: A | Discharge status: Home / Self Care | Payer: Medicare (Managed Care) | Attending: Emergency Medicine | Admitting: Emergency Medicine

## 2022-11-30 DIAGNOSIS — I700 Atherosclerosis of aorta: Secondary | ICD-10-CM | POA: Diagnosis not present

## 2022-11-30 DIAGNOSIS — R0989 Other specified symptoms and signs involving the circulatory and respiratory systems: Secondary | ICD-10-CM | POA: Diagnosis not present

## 2022-11-30 DIAGNOSIS — S199XXA Unspecified injury of neck, initial encounter: Secondary | ICD-10-CM | POA: Insufficient documentation

## 2022-11-30 DIAGNOSIS — Z7982 Long term (current) use of aspirin: Secondary | ICD-10-CM | POA: Diagnosis not present

## 2022-11-30 DIAGNOSIS — S0990XA Unspecified injury of head, initial encounter: Secondary | ICD-10-CM | POA: Insufficient documentation

## 2022-11-30 DIAGNOSIS — W19XXXA Unspecified fall, initial encounter: Secondary | ICD-10-CM

## 2022-11-30 DIAGNOSIS — Z8673 Personal history of transient ischemic attack (TIA), and cerebral infarction without residual deficits: Secondary | ICD-10-CM | POA: Diagnosis not present

## 2022-11-30 DIAGNOSIS — Y92000 Kitchen of unspecified non-institutional (private) residence as  the place of occurrence of the external cause: Secondary | ICD-10-CM | POA: Diagnosis not present

## 2022-11-30 DIAGNOSIS — M25552 Pain in left hip: Secondary | ICD-10-CM | POA: Diagnosis not present

## 2022-11-30 DIAGNOSIS — Z7902 Long term (current) use of antithrombotics/antiplatelets: Secondary | ICD-10-CM | POA: Diagnosis not present

## 2022-11-30 DIAGNOSIS — Z7901 Long term (current) use of anticoagulants: Secondary | ICD-10-CM | POA: Diagnosis not present

## 2022-11-30 DIAGNOSIS — K4090 Unilateral inguinal hernia, without obstruction or gangrene, not specified as recurrent: Secondary | ICD-10-CM | POA: Diagnosis not present

## 2022-11-30 DIAGNOSIS — Z043 Encounter for examination and observation following other accident: Secondary | ICD-10-CM | POA: Diagnosis not present

## 2022-11-30 DIAGNOSIS — R9089 Other abnormal findings on diagnostic imaging of central nervous system: Secondary | ICD-10-CM | POA: Diagnosis not present

## 2022-11-30 DIAGNOSIS — G9389 Other specified disorders of brain: Secondary | ICD-10-CM | POA: Insufficient documentation

## 2022-11-30 DIAGNOSIS — M79605 Pain in left leg: Secondary | ICD-10-CM | POA: Diagnosis not present

## 2022-11-30 DIAGNOSIS — W010XXA Fall on same level from slipping, tripping and stumbling without subsequent striking against object, initial encounter: Secondary | ICD-10-CM | POA: Diagnosis not present

## 2022-11-30 DIAGNOSIS — Y9301 Activity, walking, marching and hiking: Secondary | ICD-10-CM | POA: Diagnosis not present

## 2022-11-30 DIAGNOSIS — M62838 Other muscle spasm: Secondary | ICD-10-CM | POA: Diagnosis not present

## 2022-11-30 DIAGNOSIS — S0993XA Unspecified injury of face, initial encounter: Secondary | ICD-10-CM | POA: Diagnosis not present

## 2022-11-30 DIAGNOSIS — I10 Essential (primary) hypertension: Secondary | ICD-10-CM | POA: Insufficient documentation

## 2022-11-30 DIAGNOSIS — R935 Abnormal findings on diagnostic imaging of other abdominal regions, including retroperitoneum: Secondary | ICD-10-CM | POA: Diagnosis not present

## 2022-11-30 DIAGNOSIS — R930 Abnormal findings on diagnostic imaging of skull and head, not elsewhere classified: Secondary | ICD-10-CM | POA: Diagnosis not present

## 2022-11-30 MED ORDER — FENTANYL CITRATE PF 50 MCG/ML IJ SOSY
50.0000 ug | PREFILLED_SYRINGE | Freq: Once | INTRAMUSCULAR | Status: AC
  Administered 2022-11-30: 50 ug via INTRAVENOUS
  Filled 2022-11-30: qty 1

## 2022-11-30 MED ORDER — METHOCARBAMOL 500 MG PO TABS
1000.0000 mg | ORAL_TABLET | Freq: Once | ORAL | Status: AC
  Administered 2022-11-30: 1000 mg via ORAL
  Filled 2022-11-30: qty 2

## 2022-11-30 NOTE — Discharge Instructions (Signed)
You were given the results of your CT scan on todays visit.  Please follow up with urology.

## 2022-11-30 NOTE — ED Provider Notes (Signed)
[name] AT [facility] Provider Note   CSN: [id number] Arrival date & time: 11/30/22  1944     History HTN Chief Complaint  Patient presents with   Roberto [name] is a 72 y.o. male.  72 y.o male with a PMH of Anemia, HTN presents to the ED via EMS status post fall.  Patient reports he was walking to the kitchen when suddenly he tripped and fell landing on the left side of his body.  He does have a prior history of stroke, endorses pain to the left hip exacerbated with movement.  Is having muscle spasms during our evaluation.  He reports he did not strike his head, did not lose consciousness.  According to his records, he is currently on Plavix.No headache, no LOC, no abdominal pain , nausea or vomiting.   The history is provided by the patient.  Fall Pertinent negatives include no chest pain and no shortness of breath.       Home Medications Prior to Admission medications   Medication Sig Start Date End Date Taking? Authorizing Provider  acetaminophen (TYLENOL) 500 MG tablet Take 2 tablets (1,000 mg total) by mouth every 6 (six) hours as needed for headache or moderate pain (Fever >/= 101). 11/06/21   Evlyn Kanner, MD  aspirin 81 MG chewable tablet Chew 1 tablet (81 mg total) by mouth daily. 11/06/21   Evlyn Kanner, MD  atorvastatin (LIPITOR) 80 MG tablet Take 1 tablet (80 mg total) by mouth daily. 11/06/21   Evlyn Kanner, MD  calamine lotion Apply 1 Application topically 3 (three) times daily as needed for itching. 11/06/21   Evlyn Kanner, MD  celecoxib (CELEBREX) 200 MG capsule Take 200 mg by mouth daily. 10/18/21   [provider]  clopidogrel (PLAVIX) 75 MG tablet Take 1 tablet (75 mg total) by mouth daily. 11/06/21   Evlyn Kanner, MD  lidocaine (LIDODERM) 5 % Place 1 patch onto the skin daily. Remove & Discard patch within 12 hours or as directed by MD 11/06/21   Evlyn Kanner, MD  meloxicam (MOBIC) 7.5 MG  tablet Take 7.5 mg by mouth daily.    [provider]  methocarbamol 1000 MG TABS Take 1,000 mg by mouth every 6 (six) hours as needed for muscle spasms (flank pain). 11/06/21   Evlyn Kanner, MD  Study Jodi Mourning - asundexian 50 mg or placebo tablet (PI-Sethi) Take 1 tablet (50 mg total) by mouth daily. For Investigational Use Only. Take 1 tablet (50 mg) by mouth daily at the same time each day (preferably in the morning). Tablet should be swallowed whole with water; tablet can not be crushed or broken. Any questions or concerns regarding this medication please contact [facility]. 11/06/21   Masters, Katie, DO      Allergies    Patient has no known allergies.    Review of Systems   Review of Systems  Constitutional:  Negative for chills and fever.  Respiratory:  Negative for shortness of breath.   Cardiovascular:  Negative for chest pain.  Musculoskeletal:  Positive for arthralgias.  Skin:  Negative for pallor and wound.  All other systems reviewed and are negative.   Physical Exam Updated Vital Signs BP (!) 158/90 (BP Location: Right Arm)   Pulse 94   Temp 98.4 F (36.9 C)   Resp 16   Ht 5\' 6"  (1.676 m)   Wt 74.8 kg   SpO2 98%   BMI 26.62 kg/m  Physical Exam Vitals and nursing note reviewed.  Constitutional:      Appearance: Normal appearance.  HENT:     Head: Normocephalic and atraumatic.     Comments: No goose egg or signs of trauma.  Eyes:     Pupils: Pupils are equal, round, and reactive to light.  Cardiovascular:     Rate and Rhythm: Normal rate.  Pulmonary:     Effort: Pulmonary effort is normal.     Breath sounds: No wheezing or rales.     Comments: NO absent lungs sounds.  Abdominal:     General: Abdomen is flat.     Palpations: Abdomen is soft.     Tenderness: There is no abdominal tenderness.  Musculoskeletal:     Cervical back: Normal range of motion and neck supple.     Right hip: No tenderness, bony tenderness or  crepitus. Normal strength.     Left hip: Tenderness present. No bony tenderness or crepitus. Normal strength.     Comments: Pain with ROM of the left leg. No visible signs of injury, hematoma or obvious deformity.   Skin:    General: Skin is warm and dry.  Neurological:     Mental Status: He is alert and oriented to person, place, and time.     Cranial Nerves: No dysarthria or facial asymmetry.     Motor: Motor function is intact. No weakness.     Coordination: Coordination is intact. Finger-Nose-Finger Test normal.     ED Results / Procedures / Treatments   Labs (all labs ordered are listed, but only abnormal results are displayed) Labs Reviewed - No data to display  EKG EKG Interpretation Date/Time:  Tuesday November 30 2022 19:48:35 EDT Ventricular Rate:  84 PR Interval:  211 QRS Duration:  86 QT Interval:  353 QTC Calculation: 418 R Axis:   -21  Text Interpretation: Sinus rhythm Borderline left axis deviation Confirmed by Vonita Moss [phone]) on 11/30/2022 10:32:19 PM  Radiology No results found.  Procedures Procedures    Medications Ordered in ED Medications  fentaNYL (SUBLIMAZE) injection 50 mcg (50 mcg Intravenous Given 11/30/22 2045)  methocarbamol (ROBAXIN) tablet 1,000 mg (1,000 mg Oral Given 11/30/22 2044)    ED Course/ Medical Decision Making/ A&P                             Medical Decision Making Amount and/or Complexity of Data Reviewed Radiology: ordered.  Risk Prescription drug management.    This patient presents to the ED for concern of fall, this involves a number of treatment options, and is a complaint that carries with it a high risk of complications and morbidity.  The differential diagnosis includes dislocation versus fracture.    Co morbidities: Discussed in HPI   Brief History:  SEE HPI.   EMR reviewed including pt PMHx, past surgical history and past visits to ER.   See HPI for more details   Lab Tests:  I ordered and  independently interpreted labs.  The pertinent results include:    No labs were ordered due to mechanical fall.    Imaging Studies:  Xray of the chest portable with no acute findings.  Xray of hip showed: no acute findings.  CT Head, neck, pelvis is currently pending.  CT Lumbar spine showed: IMPRESSION: 1. Negative for acute traumatic injury. 2. Moderate volume right inguinal hernia containing a long loop of small bowel as well as mesenteric fat and  involvement of a short segment of the urinary bladder wall. Associated marked irregular bowel wall thickening of the involved urinary bladder wall. Recommend direct visualization with underlying urinary bladder malignancy not excluded. No associated bowel obstruction. 3. Aortic Atherosclerosis (ICD10-I70.0).   Cardiac Monitoring:  The patient was maintained on a cardiac monitor.  I personally viewed and interpreted the cardiac monitored which showed an underlying rhythm of: NSR 83 EKG non-ischemic   Medicines ordered:  I ordered medication including robaxin, fentanyl  for pain control Reevaluation of the patient after these medicines showed that the patient improved I have reviewed the patients home medicines and have made adjustments as needed  Reevaluation:  After the interventions noted above I re-evaluated patient and found that they have :stayed the same   Social Determinants of Health:  The patient's social determinants of health were a factor in the care of this patient   Problem List / ED Course:  Patient here status post mechanical fall while he was ambulating in the kitchen.  Reports he fell on the left side of his body, severe pain to the left hip exacerbated with ambulation and weightbearing.  He is currently on methocarbamol 1000 mg daily, he was given fentanyl to help with pain control.  Reports he did not strike his head, there was no loss of consciousness.  CT of his head and neck were obtained which did not  show any acute findings.  X-ray of his pelvis did not show any acute fractures, concern for occult fracture therefore CT lumbar spine was obtained which did not show any acute fracture but some concern for inguinal hernia. CT Head and CT cervical spine is currently pending.   Dispostion:  Patient care signed out to incoming team pending CT head/Cervical spine.    Portions of this note were generated with Scientist, clinical (histocompatibility and immunogenetics). Dictation errors may occur despite best attempts at proofreading.   Final Clinical Impression(s) / ED Diagnoses Final diagnoses:  Fall, initial encounter  Left hip pain    Rx / DC Orders ED Discharge Orders     None         Claude Manges, PA-C 12/10/22 0003    Rondel Baton, MD 12/10/22 1321

## 2022-11-30 NOTE — ED Notes (Signed)
Patient transported to CT 
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
## 2022-12-01 NOTE — ED Provider Notes (Signed)
CT Head with questionable punctate hemorrhage vs calcification.  Awaiting neurosurgery recommendations.  12:45 AM Spoke with on-call neurosurgery, Dr. Danielle Dess who has reviewed CT scan, does not feel this represents any hemorrhage.  No additional scans needed.  Patient appears stable for discharge.  He is aware of need for urology following given irregularity of the bladder wall, possible underlying malignancy.  Will likely need cystoscopy, given information for on call provider.  Can return here for new concerns.   Garlon Hatchet, PA-C 12/01/22 0048    Sloan Leiter, DO 12/02/22 [phone]

## 2023-09-10 IMAGING — MR MR THORACIC SPINE WO/W CM
4 of 9 series · 22 of 48 positions shown · IV contrast (gadavist)
Comparison: None Available.

CLINICAL DATA: Mid back pain.

EXAM:
MRI THORACIC AND LUMBAR SPINE WITHOUT AND WITH CONTRAST
TECHNIQUE: Multiplanar and multiecho pulse sequences of the thoracic and lumbar
spine were obtained without and with intravenous contrast.
CONTRAST:  7.5mL GADAVIST GADOBUTROL 1 MMOL/ML IV SOLN

[Series 19: T2 · sagittal · 3.0mm · 0.76mm/px · 3 of 17 slices shown (1 of 2)]
[im 1/17]
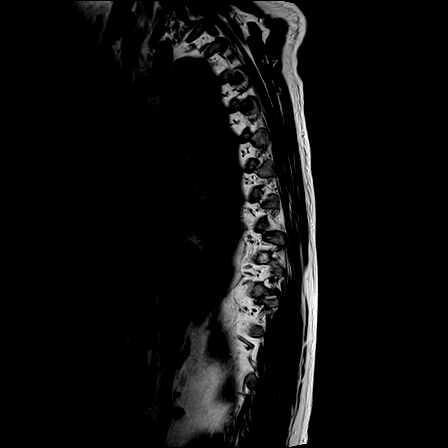
[im 9/17]
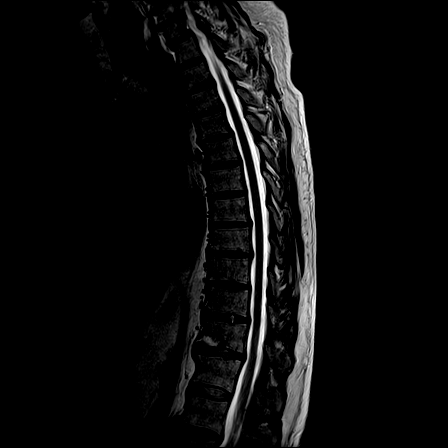
[im 17/17]
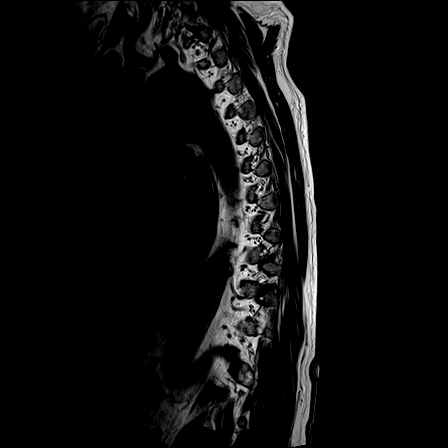

[Series 20: T1 · sagittal · 3.0mm · 0.76mm/px · 3 of 17 slices shown (1 of 2)]
[im 1/17]
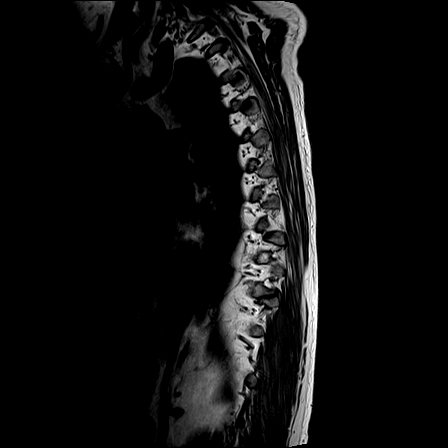
[im 9/17]
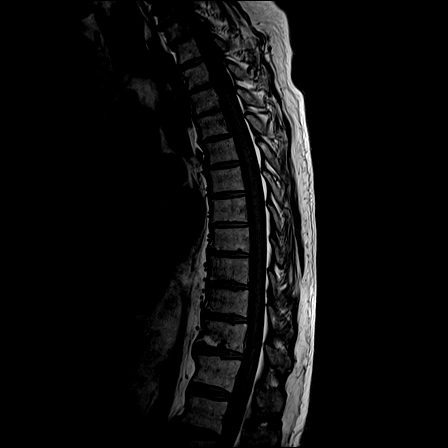
[im 17/17]
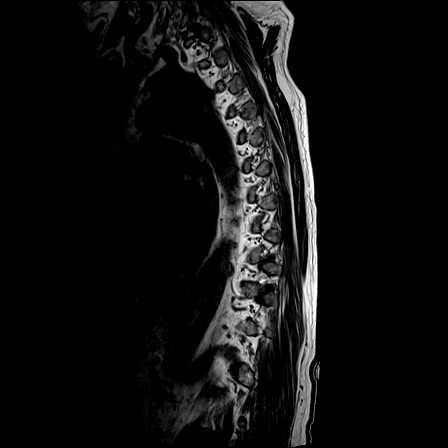

[Series 22: T2 · axial · 5.0mm · 0.59mm/px · z∈[-217,+14]mm · 8 of 39 slices shown (2 of 2)]
[im 1/39]
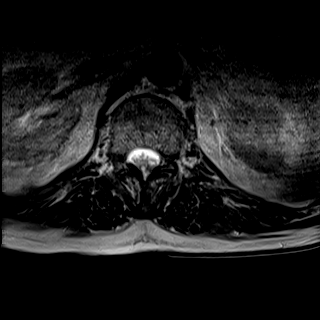
[im 6/39]
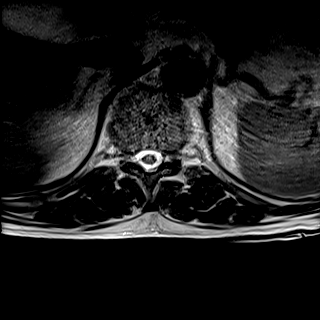
[im 11/39]
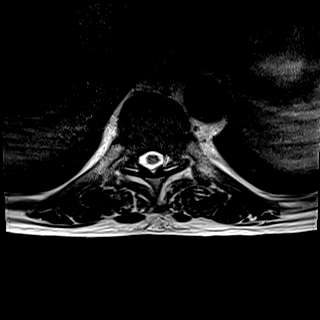
[im 17/39]
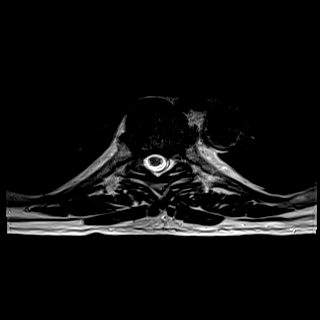
[im 22/39]
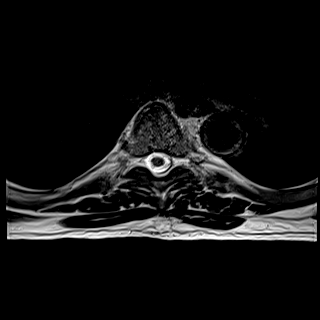
[im 28/39]
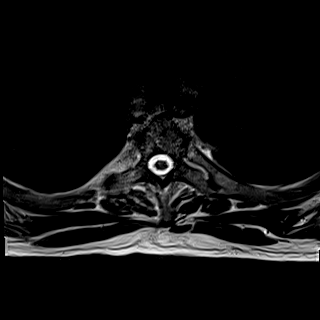
[im 33/39]
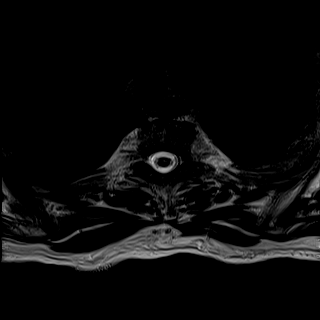
[im 39/39]
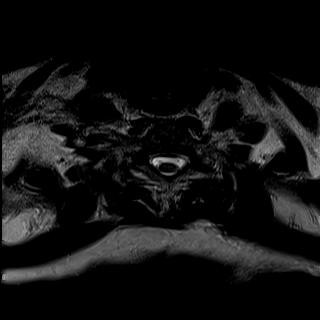

[Series 24: T1 · axial · non-contrast · 5.0mm · 0.31mm/px · z∈[-217,+14]mm · 8 of 39 slices shown (2 of 2)]
[im 1/39]
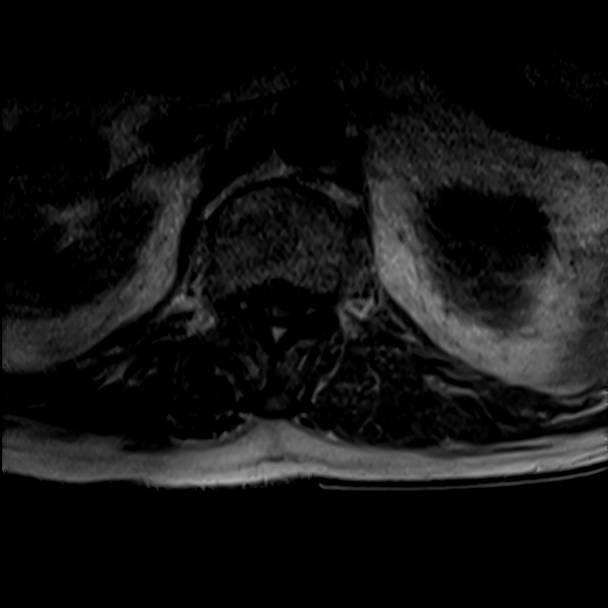
[im 6/39]
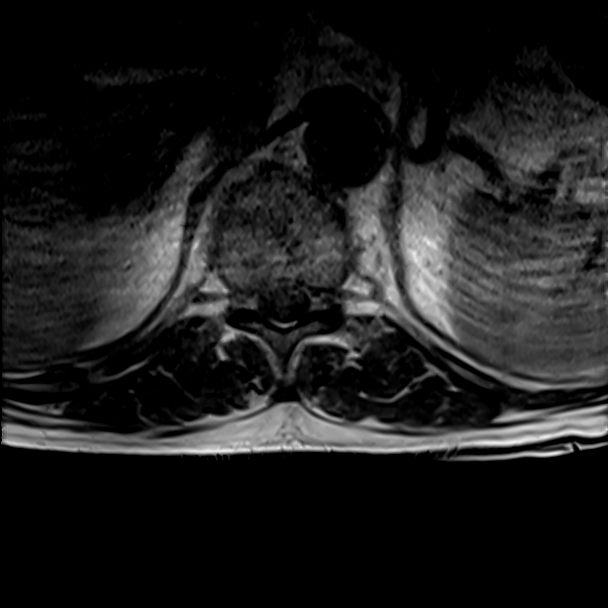
[im 11/39]
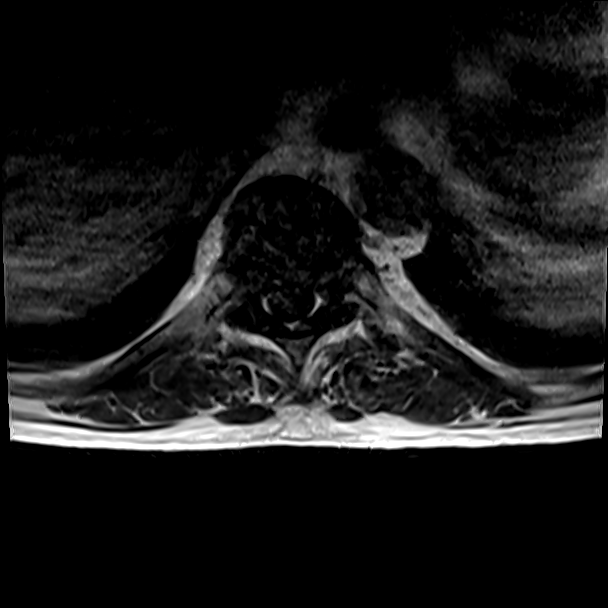
[im 17/39]
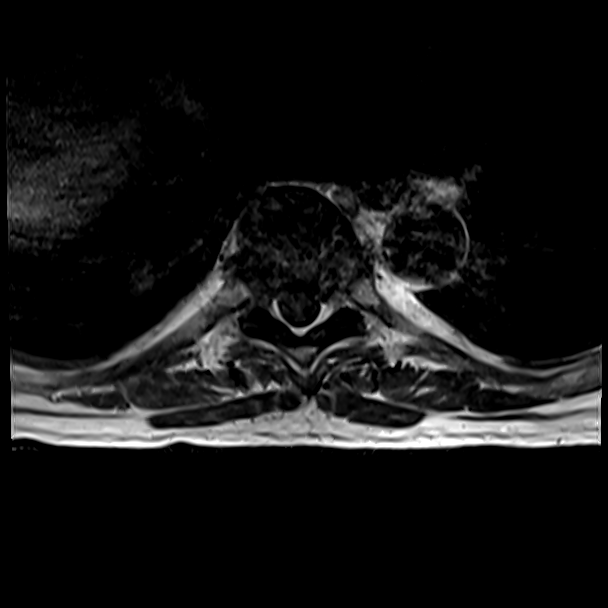
[im 22/39]
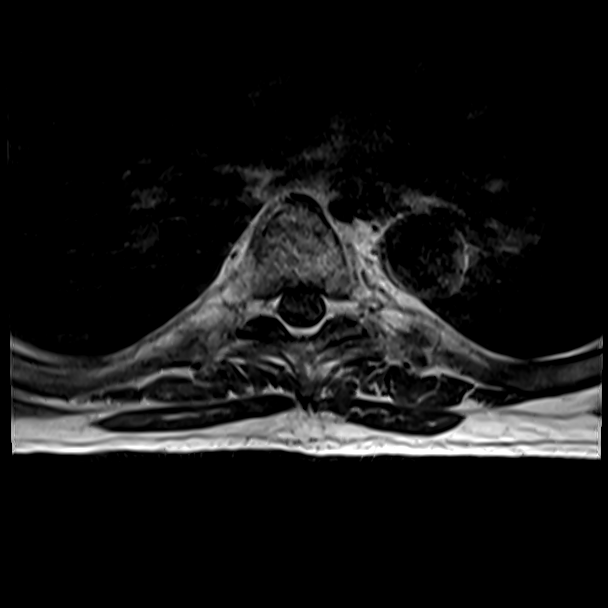
[im 28/39]
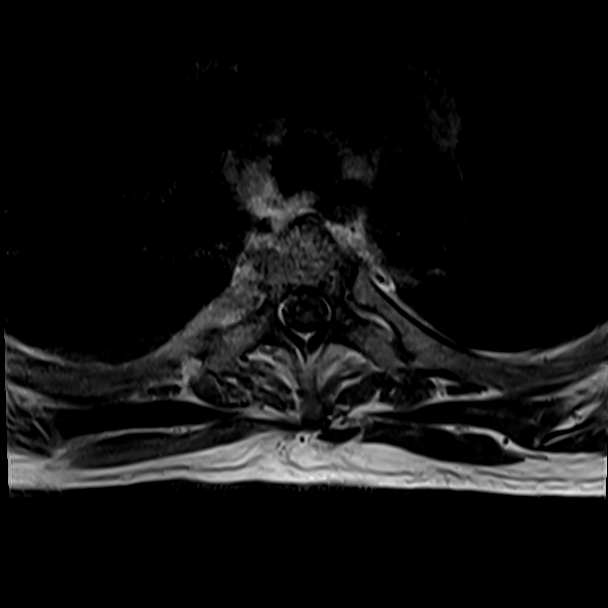
[im 33/39]
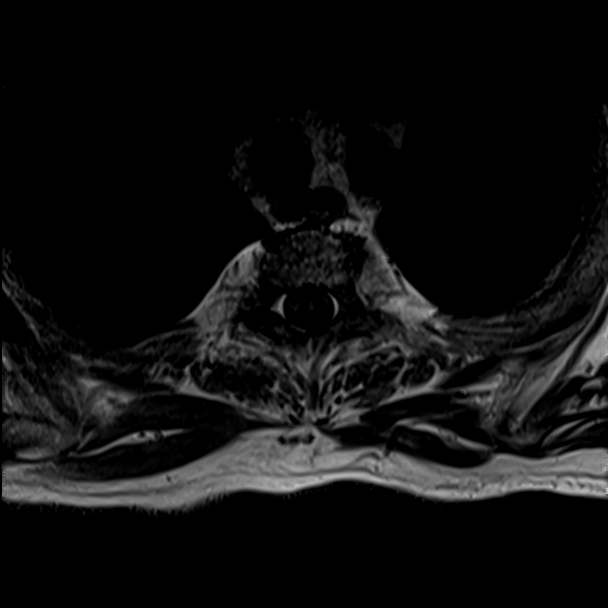
[im 39/39]
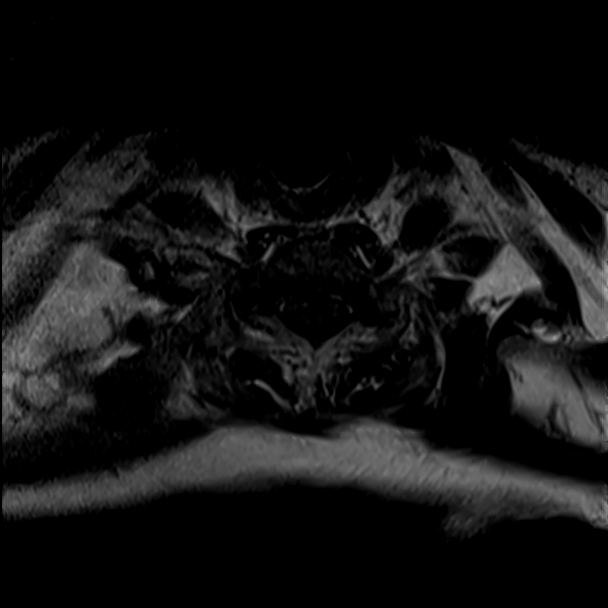

[22 of 48 positions shown; findings below may reference images not displayed]

FINDINGS: MRI THORACIC SPINE FINDINGS

Alignment:  Physiologic.

Vertebrae: No fracture, evidence of discitis, or bone lesion.

Cord: Normal signal and morphology. No abnormal intrathecal
enhancement.

Paraspinal and other soft tissues: Negative.

Disc levels:

Mild disc bulging and scattered mild facet arthropathy from T2-T3
through T11-T12. No spinal canal or neuroforaminal stenosis at any
level.

MRI LUMBAR SPINE FINDINGS

Segmentation:  Standard.

Alignment: 5 mm anterolisthesis at L5-S1 due to chronic L5 pars
defects.

Vertebrae:  No fracture, evidence of discitis, or bone lesion.

Conus medullaris: Extends to the L1-L2 level and appears normal. No
abnormal intrathecal enhancement.

Paraspinal and other soft tissues: Negative.

Disc levels:

T12-L1:  Negative.

L1-L2: Mild to moderate disc bulging. Mild bilateral neuroforaminal
stenosis. No spinal canal stenosis.

L2-L3: Mild to moderate disc bulging. Mild bilateral neuroforaminal
stenosis. No spinal canal stenosis.

L3-L4: Mild-to-moderate disc bulging. Moderate right and mild left
neuroforaminal stenosis. No spinal canal stenosis.

L4-L5: Mild disc bulging with superimposed small bilobed central and
left subarticular disc protrusion. Moderate bilateral neuroforaminal
stenosis. No spinal canal stenosis.

L5-S1: Disc uncovering and minimal disc bulging. Bilateral L5 pars
defects. Severe bilateral neuroforaminal stenosis. No spinal canal
stenosis.
IMPRESSION: 1. No acute abnormality of the thoracic or lumbar spine.
2. Chronic L5 pars defects with grade 1 anterolisthesis at L5-S1 and
severe bilateral neuroforaminal stenosis.
3. Additional multilevel thoracolumbar spondylosis as described
above.
